# Patient Record
Sex: Female | Born: 1935 | Race: White | Hispanic: No | Marital: Married | Smoking: Never smoker
Health system: Southern US, Community
[De-identification: ages and names within clinical notes are randomized; demographics above are authoritative.]

## PROBLEM LIST (undated history)

## (undated) DIAGNOSIS — I2510 Atherosclerotic heart disease of native coronary artery without angina pectoris: Secondary | ICD-10-CM

## (undated) DIAGNOSIS — Z95810 Presence of automatic (implantable) cardiac defibrillator: Secondary | ICD-10-CM

## (undated) DIAGNOSIS — C801 Malignant (primary) neoplasm, unspecified: Secondary | ICD-10-CM

## (undated) DIAGNOSIS — E119 Type 2 diabetes mellitus without complications: Secondary | ICD-10-CM

## (undated) DIAGNOSIS — C50919 Malignant neoplasm of unspecified site of unspecified female breast: Secondary | ICD-10-CM

## (undated) DIAGNOSIS — I10 Essential (primary) hypertension: Secondary | ICD-10-CM

## (undated) DIAGNOSIS — IMO0001 Reserved for inherently not codable concepts without codable children: Secondary | ICD-10-CM

## (undated) DIAGNOSIS — I509 Heart failure, unspecified: Secondary | ICD-10-CM

## (undated) HISTORY — PX: JOINT REPLACEMENT: SHX530

## (undated) HISTORY — PX: CARDIAC DEFIBRILLATOR PLACEMENT: SHX171

## (undated) HISTORY — PX: MASTECTOMY: SHX3

---

## 2005-01-17 ENCOUNTER — Ambulatory Visit: Payer: Self-pay | Admitting: Unknown Physician Specialty

## 2005-01-17 ENCOUNTER — Other Ambulatory Visit: Payer: Self-pay

## 2005-01-24 ENCOUNTER — Ambulatory Visit: Payer: Self-pay | Admitting: Unknown Physician Specialty

## 2007-09-13 ENCOUNTER — Ambulatory Visit: Payer: Self-pay | Admitting: Internal Medicine

## 2007-09-17 ENCOUNTER — Ambulatory Visit: Payer: Self-pay | Admitting: Internal Medicine

## 2007-10-02 ENCOUNTER — Ambulatory Visit: Payer: Self-pay | Admitting: Internal Medicine

## 2007-10-07 ENCOUNTER — Ambulatory Visit: Payer: Self-pay | Admitting: Internal Medicine

## 2007-10-08 ENCOUNTER — Ambulatory Visit: Payer: Self-pay | Admitting: General Surgery

## 2007-10-11 ENCOUNTER — Ambulatory Visit: Payer: Self-pay | Admitting: Internal Medicine

## 2007-11-11 ENCOUNTER — Ambulatory Visit: Payer: Self-pay | Admitting: Internal Medicine

## 2007-12-11 ENCOUNTER — Ambulatory Visit: Payer: Self-pay | Admitting: Internal Medicine

## 2008-01-11 ENCOUNTER — Ambulatory Visit: Payer: Self-pay | Admitting: Internal Medicine

## 2008-02-10 ENCOUNTER — Ambulatory Visit: Payer: Self-pay | Admitting: Internal Medicine

## 2008-03-02 ENCOUNTER — Other Ambulatory Visit: Payer: Self-pay

## 2008-03-02 ENCOUNTER — Emergency Department: Payer: Self-pay | Admitting: Emergency Medicine

## 2008-03-12 ENCOUNTER — Ambulatory Visit: Payer: Self-pay | Admitting: Internal Medicine

## 2008-03-29 ENCOUNTER — Ambulatory Visit: Payer: Self-pay | Admitting: Surgery

## 2008-04-04 ENCOUNTER — Inpatient Hospital Stay: Payer: Self-pay | Admitting: Surgery

## 2008-04-12 ENCOUNTER — Ambulatory Visit: Payer: Self-pay | Admitting: Internal Medicine

## 2008-05-12 ENCOUNTER — Ambulatory Visit: Payer: Self-pay | Admitting: Internal Medicine

## 2008-06-12 ENCOUNTER — Ambulatory Visit: Payer: Self-pay | Admitting: Internal Medicine

## 2008-07-12 ENCOUNTER — Ambulatory Visit: Payer: Self-pay | Admitting: Internal Medicine

## 2008-08-12 ENCOUNTER — Ambulatory Visit: Payer: Self-pay | Admitting: Internal Medicine

## 2008-09-12 ENCOUNTER — Ambulatory Visit: Payer: Self-pay | Admitting: Internal Medicine

## 2008-09-20 ENCOUNTER — Ambulatory Visit: Payer: Self-pay | Admitting: Internal Medicine

## 2008-10-10 ENCOUNTER — Ambulatory Visit: Payer: Self-pay | Admitting: Internal Medicine

## 2008-11-10 ENCOUNTER — Ambulatory Visit: Payer: Self-pay | Admitting: Internal Medicine

## 2008-12-10 ENCOUNTER — Ambulatory Visit: Payer: Self-pay | Admitting: Internal Medicine

## 2009-01-10 ENCOUNTER — Ambulatory Visit: Payer: Self-pay | Admitting: Internal Medicine

## 2009-04-12 ENCOUNTER — Ambulatory Visit: Payer: Self-pay | Admitting: Internal Medicine

## 2009-05-08 ENCOUNTER — Ambulatory Visit: Payer: Self-pay | Admitting: Internal Medicine

## 2009-05-12 ENCOUNTER — Ambulatory Visit: Payer: Self-pay | Admitting: Internal Medicine

## 2009-06-12 ENCOUNTER — Ambulatory Visit: Payer: Self-pay | Admitting: Internal Medicine

## 2009-07-03 ENCOUNTER — Ambulatory Visit: Payer: Self-pay | Admitting: Radiation Oncology

## 2009-09-12 ENCOUNTER — Ambulatory Visit: Payer: Self-pay | Admitting: Internal Medicine

## 2009-10-05 ENCOUNTER — Ambulatory Visit: Payer: Self-pay | Admitting: Internal Medicine

## 2009-10-09 ENCOUNTER — Ambulatory Visit: Payer: Self-pay | Admitting: Internal Medicine

## 2009-10-10 ENCOUNTER — Ambulatory Visit: Payer: Self-pay | Admitting: Internal Medicine

## 2009-11-10 ENCOUNTER — Ambulatory Visit: Payer: Self-pay | Admitting: Internal Medicine

## 2010-01-08 ENCOUNTER — Ambulatory Visit: Payer: Self-pay | Admitting: Internal Medicine

## 2010-01-10 ENCOUNTER — Ambulatory Visit: Payer: Self-pay | Admitting: Internal Medicine

## 2010-03-12 ENCOUNTER — Ambulatory Visit: Payer: Self-pay | Admitting: Internal Medicine

## 2010-04-09 ENCOUNTER — Ambulatory Visit: Payer: Self-pay | Admitting: Internal Medicine

## 2010-04-10 LAB — CANCER ANTIGEN 27.29: CA 27.29: 29 U/mL (ref 0.0–38.6)

## 2010-04-12 ENCOUNTER — Ambulatory Visit: Payer: Self-pay | Admitting: Internal Medicine

## 2010-10-09 ENCOUNTER — Ambulatory Visit: Payer: Self-pay | Admitting: Internal Medicine

## 2010-10-10 ENCOUNTER — Ambulatory Visit: Payer: Self-pay | Admitting: Internal Medicine

## 2010-10-11 ENCOUNTER — Ambulatory Visit: Payer: Self-pay | Admitting: Internal Medicine

## 2010-10-11 LAB — CANCER ANTIGEN 27.29: CA 27.29: 43.9 U/mL — ABNORMAL HIGH (ref 0.0–38.6)

## 2010-11-11 ENCOUNTER — Ambulatory Visit: Payer: Self-pay | Admitting: Internal Medicine

## 2011-01-09 ENCOUNTER — Ambulatory Visit: Payer: Self-pay | Admitting: Internal Medicine

## 2011-01-11 ENCOUNTER — Ambulatory Visit: Payer: Self-pay | Admitting: Internal Medicine

## 2011-02-05 ENCOUNTER — Ambulatory Visit: Payer: Self-pay

## 2011-03-27 ENCOUNTER — Ambulatory Visit: Payer: Self-pay | Admitting: Internal Medicine

## 2011-03-28 LAB — CANCER ANTIGEN 27.29: CA 27.29: 36.1 U/mL (ref 0.0–38.6)

## 2011-04-13 ENCOUNTER — Ambulatory Visit: Payer: Self-pay | Admitting: Internal Medicine

## 2011-10-14 ENCOUNTER — Ambulatory Visit: Payer: Self-pay | Admitting: Internal Medicine

## 2011-11-18 ENCOUNTER — Ambulatory Visit: Payer: Self-pay | Admitting: Internal Medicine

## 2011-11-18 LAB — CBC CANCER CENTER
Basophil #: 0 x10 3/mm (ref 0.0–0.1)
Basophil %: 0.2 %
Eosinophil %: 1.1 %
HGB: 11.6 g/dL — ABNORMAL LOW (ref 12.0–16.0)
Lymphocyte #: 1.7 x10 3/mm (ref 1.0–3.6)
Lymphocyte %: 22.9 %
MCH: 32.6 pg (ref 26.0–34.0)
MCHC: 35.2 g/dL (ref 32.0–36.0)
Monocyte %: 7.3 %
Neutrophil %: 68.5 %
Platelet: 250 x10 3/mm (ref 150–440)
RBC: 3.56 10*6/uL — ABNORMAL LOW (ref 3.80–5.20)
RDW: 12.6 % (ref 11.5–14.5)
WBC: 7.5 x10 3/mm (ref 3.6–11.0)

## 2011-11-18 LAB — HEPATIC FUNCTION PANEL A (ARMC)
Albumin: 3.6 g/dL (ref 3.4–5.0)
Bilirubin, Direct: 0.1 mg/dL (ref 0.00–0.20)
Bilirubin,Total: 0.5 mg/dL (ref 0.2–1.0)
SGOT(AST): 23 U/L (ref 15–37)

## 2011-11-18 LAB — CREATININE, SERUM: EGFR (Non-African Amer.): 46 — ABNORMAL LOW

## 2011-12-11 ENCOUNTER — Ambulatory Visit: Payer: Self-pay | Admitting: Internal Medicine

## 2012-10-15 ENCOUNTER — Ambulatory Visit: Payer: Self-pay | Admitting: Internal Medicine

## 2012-11-02 ENCOUNTER — Ambulatory Visit: Payer: Self-pay | Admitting: General Practice

## 2012-11-02 LAB — BASIC METABOLIC PANEL
Anion Gap: 4 — ABNORMAL LOW (ref 7–16)
BUN: 29 mg/dL — ABNORMAL HIGH (ref 7–18)
Chloride: 104 mmol/L (ref 98–107)
Co2: 29 mmol/L (ref 21–32)
Creatinine: 0.95 mg/dL (ref 0.60–1.30)
EGFR (African American): >60
EGFR (Non-African Amer.): 58 — ABNORMAL LOW
Potassium: 3.9 mmol/L (ref 3.5–5.1)
Sodium: 137 mmol/L (ref 136–145)

## 2012-11-02 LAB — MRSA PCR SCREENING

## 2012-11-02 LAB — URINALYSIS, COMPLETE
Bilirubin,UR: NEGATIVE
Glucose,UR: NEGATIVE mg/dL (ref 0–75)
Ketone: NEGATIVE
Protein: 30
Squamous Epithelial: 3
WBC UR: 25 /HPF (ref 0–5)

## 2012-11-02 LAB — CBC
MCH: 31.5 pg (ref 26.0–34.0)
Platelet: 289 10*3/uL (ref 150–440)
RBC: 3.28 10*6/uL — ABNORMAL LOW (ref 3.80–5.20)
WBC: 7.3 10*3/uL (ref 3.6–11.0)

## 2012-11-02 LAB — PROTIME-INR: INR: 1

## 2012-11-16 ENCOUNTER — Inpatient Hospital Stay: Payer: Self-pay | Admitting: General Practice

## 2012-11-17 LAB — BASIC METABOLIC PANEL
Calcium, Total: 8 mg/dL — ABNORMAL LOW (ref 8.5–10.1)
Chloride: 106 mmol/L (ref 98–107)
Co2: 25 mmol/L (ref 21–32)
EGFR (African American): 59 — ABNORMAL LOW
EGFR (Non-African Amer.): 51 — ABNORMAL LOW
Glucose: 255 mg/dL — ABNORMAL HIGH (ref 65–99)
Potassium: 4.3 mmol/L (ref 3.5–5.1)
Sodium: 138 mmol/L (ref 136–145)

## 2012-11-17 LAB — HEMOGLOBIN: HGB: 7.9 g/dL — ABNORMAL LOW (ref 12.0–16.0)

## 2012-11-18 LAB — BASIC METABOLIC PANEL
Calcium, Total: 8.1 mg/dL — ABNORMAL LOW (ref 8.5–10.1)
Chloride: 105 mmol/L (ref 98–107)
Co2: 26 mmol/L (ref 21–32)
Osmolality: 277 (ref 275–301)
Sodium: 138 mmol/L (ref 136–145)

## 2012-11-18 LAB — HEMOGLOBIN: HGB: 7.8 g/dL — ABNORMAL LOW (ref 12.0–16.0)

## 2012-11-20 LAB — BASIC METABOLIC PANEL
Anion Gap: 6 — ABNORMAL LOW (ref 7–16)
BUN: 22 mg/dL — ABNORMAL HIGH (ref 7–18)
Calcium, Total: 8.6 mg/dL (ref 8.5–10.1)
Chloride: 101 mmol/L (ref 98–107)
Co2: 31 mmol/L (ref 21–32)
Creatinine: 1.04 mg/dL (ref 0.60–1.30)
EGFR (Non-African Amer.): 52 — ABNORMAL LOW
Potassium: 3.5 mmol/L (ref 3.5–5.1)
Sodium: 138 mmol/L (ref 136–145)

## 2012-11-20 LAB — CBC WITH DIFFERENTIAL/PLATELET
Basophil %: 0.2 %
Eosinophil #: 0.2 10*3/uL (ref 0.0–0.7)
Eosinophil %: 1.6 %
HGB: 7.3 g/dL — ABNORMAL LOW (ref 12.0–16.0)
MCH: 31.2 pg (ref 26.0–34.0)
MCHC: 33.7 g/dL (ref 32.0–36.0)
Monocyte #: 1.1 x10 3/mm — ABNORMAL HIGH (ref 0.2–0.9)
WBC: 12.1 10*3/uL — ABNORMAL HIGH (ref 3.6–11.0)

## 2012-11-22 LAB — HEMOGLOBIN: HGB: 8.4 g/dL — ABNORMAL LOW (ref 12.0–16.0)

## 2012-11-22 LAB — BASIC METABOLIC PANEL
Anion Gap: 5 — ABNORMAL LOW (ref 7–16)
BUN: 22 mg/dL — ABNORMAL HIGH (ref 7–18)
Calcium, Total: 8.7 mg/dL (ref 8.5–10.1)
Co2: 32 mmol/L (ref 21–32)
EGFR (African American): >60
Glucose: 191 mg/dL — ABNORMAL HIGH (ref 65–99)
Potassium: 3.7 mmol/L (ref 3.5–5.1)
Sodium: 136 mmol/L (ref 136–145)

## 2012-11-23 LAB — CBC WITH DIFFERENTIAL/PLATELET
Basophil #: 0 10*3/uL (ref 0.0–0.1)
Eosinophil #: 0.4 10*3/uL (ref 0.0–0.7)
Eosinophil %: 3.9 %
HGB: 9 g/dL — ABNORMAL LOW (ref 12.0–16.0)
Lymphocyte %: 17.5 %
MCH: 30.1 pg (ref 26.0–34.0)
MCHC: 34 g/dL (ref 32.0–36.0)
Monocyte #: 0.8 x10 3/mm (ref 0.2–0.9)
Monocyte %: 8.5 %
Neutrophil #: 6.8 10*3/uL — ABNORMAL HIGH (ref 1.4–6.5)
RDW: 18.2 % — ABNORMAL HIGH (ref 11.5–14.5)
WBC: 9.7 10*3/uL (ref 3.6–11.0)

## 2012-11-23 LAB — BASIC METABOLIC PANEL
Anion Gap: 3 — ABNORMAL LOW (ref 7–16)
BUN: 20 mg/dL — ABNORMAL HIGH (ref 7–18)
Calcium, Total: 8.8 mg/dL (ref 8.5–10.1)
Co2: 33 mmol/L — ABNORMAL HIGH (ref 21–32)
Creatinine: 0.97 mg/dL (ref 0.60–1.30)
EGFR (African American): >60
Glucose: 82 mg/dL (ref 65–99)
Potassium: 3.4 mmol/L — ABNORMAL LOW (ref 3.5–5.1)
Sodium: 135 mmol/L — ABNORMAL LOW (ref 136–145)

## 2013-03-15 ENCOUNTER — Ambulatory Visit: Payer: Self-pay | Admitting: Internal Medicine

## 2013-03-16 LAB — HEPATIC FUNCTION PANEL A (ARMC)
Alkaline Phosphatase: 122 U/L (ref 50–136)
SGOT(AST): 19 U/L (ref 15–37)

## 2013-03-16 LAB — CBC CANCER CENTER
Basophil %: 0.6 %
Eosinophil #: 0.1 x10 3/mm (ref 0.0–0.7)
Eosinophil %: 1.3 %
HCT: 32.8 % — ABNORMAL LOW (ref 35.0–47.0)
HGB: 11.2 g/dL — ABNORMAL LOW (ref 12.0–16.0)
Lymphocyte %: 24.4 %
MCHC: 34.2 g/dL (ref 32.0–36.0)
MCV: 90 fL (ref 80–100)
Monocyte #: 0.6 x10 3/mm (ref 0.2–0.9)
Neutrophil #: 5.1 x10 3/mm (ref 1.4–6.5)
Platelet: 269 x10 3/mm (ref 150–440)
RBC: 3.64 10*6/uL — ABNORMAL LOW (ref 3.80–5.20)
RDW: 15.3 % — ABNORMAL HIGH (ref 11.5–14.5)

## 2013-03-16 LAB — CREATININE, SERUM
Creatinine: 1.17 mg/dL (ref 0.60–1.30)
EGFR (African American): 52 — ABNORMAL LOW
EGFR (Non-African Amer.): 45 — ABNORMAL LOW

## 2013-04-12 ENCOUNTER — Ambulatory Visit: Payer: Self-pay | Admitting: Internal Medicine

## 2013-12-13 ENCOUNTER — Ambulatory Visit: Payer: Self-pay | Admitting: Family Medicine

## 2014-01-25 DIAGNOSIS — Z794 Long term (current) use of insulin: Secondary | ICD-10-CM | POA: Insufficient documentation

## 2014-01-25 DIAGNOSIS — R809 Proteinuria, unspecified: Secondary | ICD-10-CM | POA: Insufficient documentation

## 2014-01-25 DIAGNOSIS — E7800 Pure hypercholesterolemia, unspecified: Secondary | ICD-10-CM | POA: Insufficient documentation

## 2014-01-25 DIAGNOSIS — E1165 Type 2 diabetes mellitus with hyperglycemia: Secondary | ICD-10-CM | POA: Insufficient documentation

## 2014-01-25 DIAGNOSIS — IMO0002 Reserved for concepts with insufficient information to code with codable children: Secondary | ICD-10-CM | POA: Insufficient documentation

## 2014-03-16 ENCOUNTER — Ambulatory Visit: Payer: Self-pay | Admitting: Internal Medicine

## 2014-03-16 LAB — HEPATIC FUNCTION PANEL A (ARMC)
ALT: 35 U/L
Albumin: 3.1 g/dL — ABNORMAL LOW (ref 3.4–5.0)
Alkaline Phosphatase: 105 U/L
Bilirubin, Direct: 0.1 mg/dL (ref 0.00–0.20)
Bilirubin,Total: 0.9 mg/dL (ref 0.2–1.0)
SGOT(AST): 27 U/L (ref 15–37)
Total Protein: 7.6 g/dL (ref 6.4–8.2)

## 2014-03-16 LAB — CBC CANCER CENTER
Basophil #: 0 x10 3/mm (ref 0.0–0.1)
Basophil %: 0.6 %
Eosinophil #: 0.2 x10 3/mm (ref 0.0–0.7)
Eosinophil %: 2.2 %
HCT: 35 % (ref 35.0–47.0)
HGB: 11.7 g/dL — AB (ref 12.0–16.0)
LYMPHS ABS: 1.7 x10 3/mm (ref 1.0–3.6)
Lymphocyte %: 22 %
MCH: 31.4 pg (ref 26.0–34.0)
MCHC: 33.4 g/dL (ref 32.0–36.0)
MCV: 94 fL (ref 80–100)
MONO ABS: 0.6 x10 3/mm (ref 0.2–0.9)
Monocyte %: 7.6 %
NEUTROS PCT: 67.6 %
Neutrophil #: 5.1 x10 3/mm (ref 1.4–6.5)
Platelet: 318 x10 3/mm (ref 150–440)
RBC: 3.73 10*6/uL — ABNORMAL LOW (ref 3.80–5.20)
RDW: 13.6 % (ref 11.5–14.5)
WBC: 7.6 x10 3/mm (ref 3.6–11.0)

## 2014-03-16 LAB — CREATININE, SERUM
CREATININE: 1.05 mg/dL (ref 0.60–1.30)
EGFR (African American): 58 — ABNORMAL LOW
GFR CALC NON AF AMER: 50 — AB

## 2014-03-17 LAB — CANCER ANTIGEN 27.29: CA 27.29: 31.5 U/mL (ref 0.0–38.6)

## 2014-04-12 ENCOUNTER — Ambulatory Visit: Payer: Self-pay | Admitting: Internal Medicine

## 2014-07-06 ENCOUNTER — Inpatient Hospital Stay: Payer: Self-pay | Admitting: Internal Medicine

## 2014-07-06 LAB — CBC WITH DIFFERENTIAL/PLATELET
BASOS PCT: 0.6 %
Basophil #: 0.1 10*3/uL (ref 0.0–0.1)
EOS ABS: 0 10*3/uL (ref 0.0–0.7)
Eosinophil %: 0 %
HCT: 42 % (ref 35.0–47.0)
HGB: 13.9 g/dL (ref 12.0–16.0)
Lymphocyte #: 1.4 10*3/uL (ref 1.0–3.6)
Lymphocyte %: 11.1 %
MCH: 31.3 pg (ref 26.0–34.0)
MCHC: 33.2 g/dL (ref 32.0–36.0)
MCV: 95 fL (ref 80–100)
MONO ABS: 0.3 x10 3/mm (ref 0.2–0.9)
MONOS PCT: 2.1 %
NEUTROS ABS: 10.9 10*3/uL — AB (ref 1.4–6.5)
NEUTROS PCT: 86.2 %
Platelet: 299 10*3/uL (ref 150–440)
RBC: 4.45 10*6/uL (ref 3.80–5.20)
RDW: 13.7 % (ref 11.5–14.5)
WBC: 12.7 10*3/uL — ABNORMAL HIGH (ref 3.6–11.0)

## 2014-07-06 LAB — APTT: Activated PTT: 31.5 secs (ref 23.6–35.9)

## 2014-07-06 LAB — PROTIME-INR
INR: 1.1
Prothrombin Time: 14 secs (ref 11.5–14.7)

## 2014-07-06 LAB — BASIC METABOLIC PANEL
Anion Gap: 13 (ref 7–16)
BUN: 16 mg/dL (ref 7–18)
CALCIUM: 9.3 mg/dL (ref 8.5–10.1)
CO2: 25 mmol/L (ref 21–32)
Chloride: 95 mmol/L — ABNORMAL LOW (ref 98–107)
Creatinine: 1.33 mg/dL — ABNORMAL HIGH (ref 0.60–1.30)
EGFR (African American): 50 — ABNORMAL LOW
GFR CALC NON AF AMER: 41 — AB
Glucose: 453 mg/dL — ABNORMAL HIGH (ref 65–99)
OSMOLALITY: 287 (ref 275–301)
Potassium: 3.8 mmol/L (ref 3.5–5.1)
SODIUM: 133 mmol/L — AB (ref 136–145)

## 2014-07-06 LAB — CK TOTAL AND CKMB (NOT AT ARMC)
CK, Total: 2037 U/L — ABNORMAL HIGH (ref 26–192)
CK-MB: 122 ng/mL — ABNORMAL HIGH (ref 0.5–3.6)

## 2014-07-06 LAB — TROPONIN I
Troponin-I: >40 ng/mL
Troponin-I: >40 ng/mL

## 2014-07-08 DIAGNOSIS — I2109 ST elevation (STEMI) myocardial infarction involving other coronary artery of anterior wall: Secondary | ICD-10-CM | POA: Insufficient documentation

## 2014-07-12 DIAGNOSIS — I2510 Atherosclerotic heart disease of native coronary artery without angina pectoris: Secondary | ICD-10-CM | POA: Insufficient documentation

## 2014-07-19 DIAGNOSIS — I4892 Unspecified atrial flutter: Secondary | ICD-10-CM | POA: Insufficient documentation

## 2014-08-17 DIAGNOSIS — L308 Other specified dermatitis: Secondary | ICD-10-CM | POA: Insufficient documentation

## 2014-08-24 ENCOUNTER — Observation Stay: Payer: Self-pay | Admitting: Family Medicine

## 2014-08-24 LAB — CBC
HCT: 38.7 % (ref 35.0–47.0)
HGB: 12.7 g/dL (ref 12.0–16.0)
MCH: 30.9 pg (ref 26.0–34.0)
MCHC: 32.9 g/dL (ref 32.0–36.0)
MCV: 94 fL (ref 80–100)
PLATELETS: 361 10*3/uL (ref 150–440)
RBC: 4.12 10*6/uL (ref 3.80–5.20)
RDW: 15 % — ABNORMAL HIGH (ref 11.5–14.5)
WBC: 7.6 10*3/uL (ref 3.6–11.0)

## 2014-08-24 LAB — COMPREHENSIVE METABOLIC PANEL
ALBUMIN: 2.9 g/dL — AB (ref 3.4–5.0)
ALK PHOS: 226 U/L — AB
AST: 45 U/L — AB (ref 15–37)
Anion Gap: 11 (ref 7–16)
BUN: 22 mg/dL — ABNORMAL HIGH (ref 7–18)
Bilirubin,Total: 0.4 mg/dL (ref 0.2–1.0)
CALCIUM: 9.3 mg/dL (ref 8.5–10.1)
CHLORIDE: 97 mmol/L — AB (ref 98–107)
CO2: 24 mmol/L (ref 21–32)
Creatinine: 1.51 mg/dL — ABNORMAL HIGH (ref 0.60–1.30)
EGFR (African American): 43 — ABNORMAL LOW
EGFR (Non-African Amer.): 35 — ABNORMAL LOW
Glucose: 405 mg/dL — ABNORMAL HIGH (ref 65–99)
Osmolality: 285 (ref 275–301)
POTASSIUM: 4.5 mmol/L (ref 3.5–5.1)
SGPT (ALT): 38 U/L
Sodium: 132 mmol/L — ABNORMAL LOW (ref 136–145)
TOTAL PROTEIN: 8.1 g/dL (ref 6.4–8.2)

## 2014-08-24 LAB — TROPONIN I
Troponin-I: 0.06 ng/mL — ABNORMAL HIGH
Troponin-I: 0.05 ng/mL
Troponin-I: 0.06 ng/mL — ABNORMAL HIGH

## 2014-08-24 LAB — CK TOTAL AND CKMB (NOT AT ARMC)
CK, TOTAL: 90 U/L (ref 26–192)
CK, Total: 114 U/L (ref 26–192)
CK, Total: 113 U/L (ref 26–192)
CK-MB: 2.3 ng/mL (ref 0.5–3.6)
CK-MB: 2.2 ng/mL (ref 0.5–3.6)
CK-MB: 1.8 ng/mL (ref 0.5–3.6)

## 2014-08-25 LAB — BASIC METABOLIC PANEL
Anion Gap: 7 (ref 7–16)
BUN: 23 mg/dL — AB (ref 7–18)
CO2: 28 mmol/L (ref 21–32)
Calcium, Total: 9 mg/dL (ref 8.5–10.1)
Chloride: 102 mmol/L (ref 98–107)
Creatinine: 1.4 mg/dL — ABNORMAL HIGH (ref 0.60–1.30)
EGFR (African American): 47 — ABNORMAL LOW
EGFR (Non-African Amer.): 39 — ABNORMAL LOW
Glucose: 64 mg/dL — ABNORMAL LOW (ref 65–99)
Osmolality: 276 (ref 275–301)
Potassium: 4.3 mmol/L (ref 3.5–5.1)
Sodium: 137 mmol/L (ref 136–145)

## 2014-08-25 LAB — CBC WITH DIFFERENTIAL/PLATELET
Basophil #: 0.1 10*3/uL (ref 0.0–0.1)
Basophil %: 1.2 %
EOS PCT: 2.6 %
Eosinophil #: 0.2 10*3/uL (ref 0.0–0.7)
HCT: 33.2 % — AB (ref 35.0–47.0)
HGB: 10.7 g/dL — ABNORMAL LOW (ref 12.0–16.0)
LYMPHS ABS: 2.6 10*3/uL (ref 1.0–3.6)
Lymphocyte %: 33.6 %
MCH: 30.3 pg (ref 26.0–34.0)
MCHC: 32.3 g/dL (ref 32.0–36.0)
MCV: 94 fL (ref 80–100)
MONO ABS: 0.7 x10 3/mm (ref 0.2–0.9)
Monocyte %: 8.6 %
Neutrophil #: 4.2 10*3/uL (ref 1.4–6.5)
Neutrophil %: 54 %
PLATELETS: 324 10*3/uL (ref 150–440)
RBC: 3.54 10*6/uL — AB (ref 3.80–5.20)
RDW: 14.9 % — ABNORMAL HIGH (ref 11.5–14.5)
WBC: 7.7 10*3/uL (ref 3.6–11.0)

## 2014-08-26 LAB — BASIC METABOLIC PANEL
Anion Gap: 8 (ref 7–16)
BUN: 26 mg/dL — AB (ref 7–18)
Calcium, Total: 8.7 mg/dL (ref 8.5–10.1)
Chloride: 101 mmol/L (ref 98–107)
Co2: 25 mmol/L (ref 21–32)
Creatinine: 1.4 mg/dL — ABNORMAL HIGH (ref 0.60–1.30)
EGFR (African American): 47 — ABNORMAL LOW
EGFR (Non-African Amer.): 39 — ABNORMAL LOW
Glucose: 196 mg/dL — ABNORMAL HIGH (ref 65–99)
Osmolality: 278 (ref 275–301)
POTASSIUM: 4.3 mmol/L (ref 3.5–5.1)
Sodium: 134 mmol/L — ABNORMAL LOW (ref 136–145)

## 2014-08-27 LAB — BASIC METABOLIC PANEL
ANION GAP: 8 (ref 7–16)
BUN: 23 mg/dL — ABNORMAL HIGH (ref 7–18)
CO2: 25 mmol/L (ref 21–32)
CREATININE: 1.21 mg/dL (ref 0.60–1.30)
Calcium, Total: 9 mg/dL (ref 8.5–10.1)
Chloride: 105 mmol/L (ref 98–107)
EGFR (Non-African Amer.): 46 — ABNORMAL LOW
GFR CALC AF AMER: 55 — AB
Glucose: 63 mg/dL — ABNORMAL LOW (ref 65–99)
OSMOLALITY: 277 (ref 275–301)
Potassium: 4.1 mmol/L (ref 3.5–5.1)
Sodium: 138 mmol/L (ref 136–145)

## 2014-10-14 ENCOUNTER — Inpatient Hospital Stay: Payer: Self-pay | Admitting: Internal Medicine

## 2014-12-02 NOTE — Discharge Summary (Signed)
PATIENT NAME:  Lauren Best, Lauren Best MR#:  [id number] DATE OF BIRTH:  01/17/1933  DATE OF ADMISSION:  11/16/2012 DATE OF DISCHARGE: 11/19/2012   ADMITTING DIAGNOSIS: Degenerative arthrosis of the right knee.   DISCHARGE DIAGNOSIS: Degenerative arthrosis of the right knee.   HISTORY: The patient is a pleasant 81 year old female, who has been followed at [facility] for progression of right knee pain. She reported an approximately 8 to 9 year history of progressive knee pain. She had had a remote right knee arthroscopy performed by Dr. Mauri Pole  in 2006. She had seen progression in her discomfort to the medial joint line. The patient also reported crepitus with start of stiffness. She has also been experincing some night pain. The most significant pain was along the medial aspect of the knee, but she also had some lateral joint line discomfort. At the time of surgery, she was not using any ambulatory aid. The patient had not seen any significant improvement despite activity modification, viscous supplementation, and nonsteroidal anti-inflammatory medications. She had reported some activity-related swelling of the knee as well as near giving way. She was having difficulty with ascending and descending stairs due to the severity of knee pain. The pain had progressed to the point that it was significantly interfering with her activities of daily living. X-rays taken in [place] showed narrowing of the medial cartilage space with associated varus alignment. Subchondral sclerosis was noted. She was also noted to have significant patellofemoral degenerative changes. After discussion of the risks and benefits of surgical intervention, the patient expressed her understanding of the risks and benefits and agreed for plans for surgical intervention.   PROCEDURE: Right total knee arthroplasty using computer-assisted navigation.   ANESTHESIA: Femoral nerve block with spinal.   SOFT TISSUE RELEASE:  Anterior cruciate ligament, posterior cruciate ligament, deep medial collateral ligaments, as well as patellofemoral ligament.   IMPLANTS UTILIZED: DePuy PFC Sigma size 3 posterior stabilized femoral component (cemented), size 3 MBT tibial component (cemented), 35 mm 3-pegged oval dome patella (cemented), and a 10 mm stabilized rotating platform polyethylene insert.   HOSPITAL COURSE: The patient tolerated the procedure very well. She had no complications. She was then taken to the PACU where she was stabilized and then transferred to the orthopedic floor. The patient began receiving anticoagulation therapy of Lovenox 30 mg subcutaneous every 12 hours per anesthesia and pharmacy protocol. She was fitted with TED stockings bilaterally. These were allowed to be removed 1 hour per 8 hour shift. The right one was applied on day 2 following removal of the Hemovac and the dressing change. The wound was free of any drainage or signs of infection. The patient was also fitted with the AV-I compression foot pumps bilaterally set at 80 mmHg. Her calves have been nontender. There has been no evidence of any deep venous thromboses. Negative Homans sign. Heels were elevated off the bed using rolled towels.   The patient has denied any chest pains or shortness of breath. Vital signs have been stable. She has been afebrile. Hemodynamically she has been stable. No transfusions were needed other than the Autovac transfusions postoperatively. Her hemoglobin did drop to 7.8, but she was asymptomatic. Vital signs were stable. She was not dizzy or lightheaded. No weakness was reported.   Physical therapy was initiated on day 1 for gait training and transfers. She has done very well. She was able to ambulate greater than 200 feet. She was able to go up and down 4 sets of steps. She  was independent with bed to chair transfers. Occupational therapy was also initiated on day 1 for ADLs and assistive devices. There has been no  complications. The patient; however, likes to sleep in a fetal position. Every morning during rounds the patient's knee was noted to be flexed at a good 30 degrees. With some manipulation, we were able to get this straightened out. Then subsequently, she will be going home in knee immobilizer to be worn at night.   The patient's IV, Foley and Hemovac were discontinued on day 2. The Polar Care was reapplied to the surgical leg maintaining a temperature of 40 to 50 degrees Fahrenheit.   DISPOSITION:  1.  The patient is being discharged to home and appears in stable condition.  2.  Continue weight-bearing as tolerated.  3.  Continue TED stockings. These are to be worn during the day, but may be removed at night.  4.  Continue using a walker until cleared by physical therapy to go to a quad cane.  5.  She will receive home health PT.  6.  Continue Polar Care maintaining a temperature of 40 to 50 degrees Fahrenheit.  7.  Call the office if temperatures are 101.5 or greater or excessive bleeding.  8.  She has a followup appointment on 04/22.  9.  She was placed on a regular diet.  10. Resume regular medication that she was on prior to admission.  11. A prescription for Roxicodone 5 to 10 mg every 4 to 6 hours p.r.n. for pain was given. Also, a prescription for tramadol 50 to 100 mg every 4 to 6 hours p.r.n. for pain and Lovenox 40 mg subcutaneously daily for 14 days, then discontinue and begin taking one 81 mg enteric-coated aspirin.   PAST MEDICAL HISTORY:  1.  Seasonal allergies.  2.  Hypertension.  3.  Hyperlipidemia.  4.  Diabetes. 5.  Breast cancer in 2008.   ____________________________ Vance Peper, PA jrw:aw D: 11/19/2012 07:22:56 ET T: 11/19/2012 07:33:22 ET JOB#: [id number]  cc: Vance Peper, PA, <Dictator> Gaye Scorza PA ELECTRONICALLY SIGNED 11/19/2012 21:24

## 2014-12-02 NOTE — Op Note (Signed)
DATE OF BIRTH:  1933-08-10  DATE OF PROCEDURE:  11/16/2012  PREOPERATIVE DIAGNOSIS:  Degenerative arthrosis of the right knee.   POSTOPERATIVE DIAGNOSIS:  Degenerative arthrosis of the right knee.   PROCEDURE PERFORMED:  Right total knee arthroplasty using computer-assisted navigation.   SURGEON:  Dr. Skip Estimable   ASSISTANT:  Vance Peper, PA  ANESTHESIA:  Femoral nerve block and spinal.   ESTIMATED BLOOD LOSS:  50 mL.   FLUIDS REPLACED: 1600 mL of crystalloid.   TOURNIQUET TIME:  101 minutes.   DRAINS: Two medium drains to reinfusion system.   SOFT TISSUE RELEASES:  Anterior cruciate ligament, posterior cruciate ligament, deep medial collateral ligament, and patellofemoral ligament.   IMPLANTS UTILIZED:  DePuy PFC Sigma size 3 posterior stabilized femoral component (cemented), size 3 MBT tibial component (cemented), 35 mm 3-peg oval dome patella (cemented), and a 10 mm stabilized rotating platform polyethylene insert.   INDICATIONS FOR SURGERY: The patient is a 81 year old female who has been seen for complaints of progressive right knee pain. She denies any significant improvement despite conservative nonsurgical intervention. X-rays demonstrated severe degenerative changes in tricompartmental fashion with relative varus deformity. She did have a flexion contracture. After discussion of risks and benefits of surgical intervention, the patient expressed understanding of the risks and benefits, and agreed with plans for right total knee arthroplasty.   PROCEDURE IN DETAIL:  The patient was brought to the Operating Room and, after adequate femoral nerve block and spinal anesthesia was achieved, a tourniquet was placed on the patient's upper right thigh. The patient's right knee and leg were cleaned and prepped with alcohol and DuraPrep, draped in the usual sterile fashion. A "timeout" was performed, as per usual protocol. The patient's right lower extremity was exsanguinated using an  Esmarch, the tourniquet was inflated to 300 mmHg. An anterior longitudinal incision was made, followed by a standard mid vastus approach. A moderate effusion was evacuated. The deep fibers of the medial collateral ligament were elevated in subperiosteal fashion off of the medial flare of the tibia, so as to maintain a continuous soft tissue sleeve. The patella was subluxed laterally, and the patellofemoral ligament was incised. Inspection of the knee demonstrated severe degenerative changes in tricompartmental fashion, with full-thickness loss of articular cartilage, especially to the medial compartment. Prominent osteophytes were debrided using a rongeur. Anterior and posterior cruciate ligaments were excised. Two 4.0 mm Schanz pins were inserted into the femur and into the tibia for attachment of the array of trackers used for computer-assisted navigation. Hip center was identified using a circumduction technique. Distal landmarks were mapped using the computer. The distal femur and proximal tibia were mapped using the computer. Distal femoral cutting guide was positioned using computer-assisted navigation so as to achieve a 5-degree distal varus cut. Cut was performed and verified using the computer. Distal femur was sized, and it was felt that a size 3 femoral component was appropriate. Size 3 cutting guide was positioned, and anterior cut was performed and verified using the computer. This was followed by completion of the posterior and chamfer cuts. Femoral cutting guide for central valgus was then positioned, and the central valgus cut was performed.   Attention was then directed to the proximal tibia. Medial and lateral menisci were excised. The extramedullary tibial cutting guide was positioned using computer-assisted navigation so as to achieve 0 degree varus/valgus alignment and 0 degree posterior slope. Cut was performed and verified using the computer. The proximal tibia was sized, and it was felt  that  a size 3 tibial tray was appropriate. Tibial and femoral trials were inserted, followed by insertion of a 10 mm polyethylene insert. The knee was felt to be tight both in flexion and in extension. Trial components were removed, and the extramedullary tibial cutting guide was positioned so as to remove an additional 2 mm of bone. Cut was performed and verified using the computer. Trial reduction was again performed, and the knee was still felt to be tight both in flexion and in extension. The trial components were removed a second time, and the extramedullary tibial cutting guide was positioned so as to remove an additional 2 mm of bone. Cut was performed and verified using the computer. The size 3 tibial tray and the size 3 femoral component were positioned, and a 10 mm polyethylene insert was placed. Full extension was noted and excellent medial and lateral soft tissue balancing was appreciated both in flexion and in extension. Finally, the patella was cut and prepared so as to accommodate a 35 mm 3-peg oval dome patella. Patellar trial was placed, and the knee was placed through a range of motion. The femoral component was removed after debridement of posterior osteophytes. Central post hole for the tibial component was reamed, followed by insertion of a keel punch. The cut surfaces of bone were irrigated with copious amounts of normal saline with antibiotic solution using pulsatile lavage, and then suctioned dry. Polymethylmethacrylate cement with gentamicin was prepared in the usual fashion using a vacuum mixer. Cement was applied to the cut surface of the proximal tibia as well as along the undersurface of the size 3 MBT tibial component. Tibial component was positioned and impacted into place. Excess cement was removed using freer elevators. Cement was then applied to the cut surface of the femur as well as along the posterior flanges of a size 3 posterior stabilized femoral component. Femoral component  was positioned and impacted into place. Excess cement was removed using freer elevators. A 10 mm polyethylene trial was inserted, and the knee was brought in full extension with steady axial compression applied. Finally, cement was applied to the back side of a 35 mm 3-peg oval dome patella, and the patellar component was positioned and patellar clamp applied. Excess cement was removed using freer elevators.   After adequate curing of cement, the tourniquet was deflated after total tourniquet time of 101 minutes. Hemostasis was achieved using electrocautery. The knee was irrigated with copious amounts of normal saline with antibiotic solution using pulsatile lavage, and then suctioned dry. The knee was inspected for any residual cement debris. Then 30 mL of 0.25% Marcaine with epinephrine was injected along the posterior capsule. A 10 mm stabilized rotating platform polyethylene insert was inserted, and the knee was placed through a range of motion. Excellent patellar tracking was appreciated, and excellent mediolateral soft tissue balancing was noted.   Two medium drains were placed in the wound bed and brought out through a separate stab incision to be attached to a reinfusion system. The medial parapatellar portion of the incision was reapproximated using interrupted sutures of #1 Vicryl. The subcutaneous tissue was approximated in layers using first #0 Vicryl followed by #2-0 Vicryl. Skin was closed with skin staples. Sterile dressing was applied.   The patient tolerated the procedure well. She was transported to the recovery room in stable condition.    ____________________________ Laurice Record. Holley Bouche., MD jph:mr D: 11/16/2012 17:41:15 ET T: 11/16/2012 20:47:42 ET JOB#: [id number]  cc: Laurice Record. Holley Bouche., MD, <Dictator>  JAMES Evalina Field MD ELECTRONICALLY SIGNED 11/17/2012 19:53

## 2014-12-02 NOTE — Discharge Summary (Signed)
PATIENT NAME:  Lauren Best, Lauren Best MR#:  [id number] DATE OF BIRTH:  [birth year]  DATE OF ADMISSION:  11/16/2012 DATE OF DISCHARGE:  11/23/2012  ADDENDUM TO TRANSFER SUMMARY:  Although initial plans had been for discharge to home, the patient had a setback with physical therapy and had limited ambulation on the anticipated date of discharge. She was also noted to have some tachycardia as well as hypoxia, most notably with attempts at physical therapy. Subsequent blood work demonstrated hemoglobin to be 7.3. Due to the tachycardia as well as episodes of hypoxia, it was elected to proceed with transfusion of 1 unit of packed red blood cells. Subsequent hemoglobin on the day following transfusion was 8.4 with hemoglobin on the day of transfer being 9.0. The tachycardia essentially resolved and hypoxia improved significantly with pulmonary toilet and encouragement of incentive spirometry. She did have an episode on April 12th of hypoglycemia, which subsequently normalized. She was maintained on sliding scale insulin. On  April 13th, the patient made significant improvement with physical therapy, demonstrated improved stamina and maintenance of O2 saturation despite exercise. However, due to the hypoxia and tachycardia, internal medicine had recommended evaluation for pulmonary embolism not only due to her risk factors associated with surgery but also with the current use of Femara. CT scan with contrast could not be obtained and there was a delay in obtaining the lung V/Q scan until appropriate contrast could be obtained. V/Q scan was obtained on the date of admission with findings consistent with low probability of pulmonary embolism. Of note, repeat chest x-ray was also performed which demonstrated some slight increased density in the right upper lobe suggestive of either atelectasis versus pneumonia. Internal medicine subsequent placed the patient on Levaquin for a 7-day course. It should be noted that the patient has  been afebrile and has not had productive cough.   Following pulmonary workup, the patient was felt to be stable for transfer to a skilled nursing facility. The patient is discharged to [facility] in stable condition. Medications and instructions are as previously dictated.   ____________________________ Laurice Record. Holley Bouche., MD jph:cs D: 11/23/2012 14:50:59 ET T: 11/23/2012 15:06:40 ET JOB#: [id number]  cc: Jeneen Rinks P. Holley Bouche., MD, <Dictator> Laurice Record Holley Bouche MD ELECTRONICALLY SIGNED 11/23/2012 16:53

## 2014-12-02 NOTE — Consult Note (Signed)
PATIENT NAME:  Lauren Best, Lauren Best MR#:  [id number] DATE OF BIRTH:  Oct 11, 1932  DATE OF CONSULTATION:  11/19/2012  REFERRING PHYSICIAN:   Dr. Marry Guan.  CONSULTING PHYSICIAN:  Albertine Patricia, MD  PRIMARY CARE PHYSICIAN:  Dr. Netty Starring.     CHIEF COMPLAINT:  Hypoxia.   HISTORY OF PRESENT ILLNESS:  This is a 81 year old female with significant past medical history of diabetes, hypertension, hyperlipidemia, right breast cancer, osteoarthritis, normocytic anemia with baseline hemoglobin 1.7, with known history of right knee osteoarthrosis, who was admitted by Dr. Marry Guan where she had a right total knee arthroplasty using computer-assisted navigation, done on April 7th, today is the patient's postop day #3, physical consult was called for hypoxia, as the patient was noticed to be requiring oxygen during her hospital stay, 1 to 2 liters, and even becomes hypoxic on 1 liter of nasal cannula where she desaturated to 87. The patient denies any history of lung disease or cardiac disease, did not require oxygen before. She denies any chest pain, any shortness of breath, any coffee-ground emesis, any cough, any productive sputum, any fever or chills. The patient was noticed to have lower extremity edema. As mentioned earlier, there is no history of congestive heart failure. There is no echo done in the recent past as well. There is no BMP done as well. As well, the patient reports she has been using incentive spirometry 1 to 2 times a day only. The patient has been on DVT prophylaxis, subcutaneous Lovenox, 30 mg subcutaneous every 12 hours from postop day #1. The patient used to be on 100 mL fluid/hour for the last 2 days, which was stopped as of yesterday, as she lost her IV access.   PAST MEDICAL HISTORY:   1.  Diabetes mellitus.  2.  Hypertension.  3.  Hyperlipidemia.  4.  Right breast cancer status post right breast mastectomy, followed by Drs. Ma Hillock and [employer]. 5.  Osteoarthritis of the right knee.  6.   Normocytic anemia.   PAST SURGICAL HISTORY:  1.  Right knee surgery.  2.  Right breast mastectomy.  3.  Port-A-Cath placed by Dr. Jamal Collin.    FAMILY HISTORY:  No history of heart disease or cancer.   SOCIAL HISTORY:  The patient is married, lives with her husband and granddaughter, is retired. No history of tobacco, alcohol or substance abuse.   CURRENT MEDICATIONS: 1.  Tylenol 500 to 1000 mg every 4 hours as needed for pain and fever.  2.  Aluminum with magnesium hydroxide with simethicone as needed.  3.  Dulcolax suppository as needed.  4.  Calcium citrate with vitamin D twice a day.  5.  Vitamin B12 at 1000 mcg oral daily.  6.  Senna or docusate 1 tablet b.i.d.  7.  Hydrochlorothiazide 12.5 mg oral daily.  8.  Insulin 75/25 at 50 units b.i.d. a.c.  9.  Insulin sliding scale Novolin.  10.  Femara oral daily 2.5 mg. 11.  Milk of magnesia as needed.  12.  Metformin 1000 mg oral 2 times a day.  13.  Morphine as needed.  14.  Oxycodone as needed.  15.  Crestor 10 mg at bedtime.  16.  Protonix 40 mg oral 2 times a day.  17.  Tramadol 50 to 100 mg every 4 hours as needed for pain.  18.  Valsartan 150 mg oral daily.  19.  Lovenox 30 mg subcutaneous every 12 hours.  20.  Ferrous sulfate 325 mg oral p.o. b.i.d.  21.  Lactulose 30 mL p.o. b.i.d.   REVIEW OF SYSTEMS: CONSTITUTIONAL:  Denies any fever or chills, generalized weakness, weight loss or weight gain.  EYES:  Denies blurred vision, glaucoma, cataracts.  EAR, NOSE, THROAT:  Denies tinnitus, epistaxis, discharge.  RESPIRATORY:  Denies cough, COPD, hemoptysis, dyspnea, coffee-ground emesis, productive sputum.  CARDIOVASCULAR:  Denies chest pain, orthopnea, palpitations, syncope.  GASTROINTESTINAL:  Denies nausea, vomiting, diarrhea, abdominal pain. Has complaints of constipation.  GENITOURINARY:  Denies dysuria or hematuria.  ENDOCRINE:  Denies polyuria,  polydipsia, heat or cold intolerance.  HEMATOLOGY:  Denies easy bruising,  bleeding, diathesis, history of blood clots. Has history of chronic anemia.  INTEGUMENTARY:  Denies acne, rash or skin lesions.    MUSCULOSKELETAL:  Denies back pain, neck pain. Has right knee pain postop.  NEUROLOGIC:  No vertigo, ataxia, dementia.  PSYCHIATRIC:  Denies any depression, insomnia, substance or alcohol abuse.   PHYSICAL EXAMINATION: VITAL SIGNS:  Temperature 99.9, pulse 103, respiratory rate 18, blood pressure 122/69, saturating 93% on 2 liters nasal cannula, with ambulation dropped to 87%.  GENERAL:  Elderly female, obese, less comfortable, in no apparent distress.  HEENT:  Head atraumatic, normocephalic. Pupils equal, reactive to light. Pink conjunctivae. Anicteric sclerae. Moist oral mucosa.  NECK:  Supple. No thyromegaly. No JVD.  CHEST:  Has good air entry bilaterally. No wheezing, but has rales at the bases.  CARDIOVASCULAR:  S1, S2 heard. No rubs, murmurs or gallops.  ABDOMEN:  Obese, soft, nontender, nondistended. Bowel sounds present.  EXTREMITIES:  Bilaterally edema +1 to 2. Pedal pulses felt. Has right knee swelling postop, midline surgical scar looks nice. Healing nicely. No discharge or bleed.  NEUROLOGIC:  Cranial nerves grossly intact. Motor 5/5.  PSYCHIATRIC:  Appropriate affect. Awake, alert x 3.   PERTINENT LABORATORY DATA:  As of April 14, glucose 90, BUN 17, creatinine 1.05, sodium 138, potassium 3.6, chloride 105, anion gap 7. Hemoglobin 7.8, platelets 238.   ASSESSMENT AND PLAN: 1.  Hypoxia, mainly upon ambulation. Etiology so far is unclear, but the patient has lower extremity edema, even though there is no history of congestive heart failure in the past. We will check BNP, as well we will check 2-D echo to evaluate for congestive heart failure, as well we will obtain chest x-ray to evaluate for any volume overload, even though clinically aside from her lower extremity edema, there is not any evidence of congestive heart failure, but if has some volume  overload or congestive heart failure, we will start her on Lasix. As well, she has a history of obesity so unclear if pulmonary hypertension contributes to her hypoxia as well. We will obtain chest x-ray, as mentioned earlier. The patient was noncompliant with incentive spirometry, as she has only used it twice yesterday. Was encouraged to use it more often, as atelectasis might be contributing to her hypoxia.  2.  Anemia. The patient has known historyof normocytic anemia, which worsened from blood loss by surgery. We will continue her on iron supplement.  3.  Diabetes mellitus. Continue with metformin and Humulin 75/25, add insulin sliding scale.  4.  Hypertension, acceptable. Continue with hydrochlorothiazide.  5.  Hyperlipidemia. Continue with statin.  6.  History of right breast cancer. The patient is followed by Dr. Ma Hillock. Continue with Femara.  7.  Deep vein thrombosis prophylaxis, subcutaneous Lovenox.  8.  Gastrointestinal prophylaxis, on proton pump inhibitor.  TOTAL TIME SPENT: 45 minutes.   ____________________________ Albertine Patricia, MD dse:dmm D: 11/19/2012 09:00:18 ET T: 11/19/2012  09:57:37 ET JOB#: [id number]  cc: Albertine Patricia, MD, <Dictator> Oluwaseun Cremer Graciela Husbands MD ELECTRONICALLY SIGNED 11/20/2012 7:18

## 2014-12-03 NOTE — Consult Note (Signed)
PATIENT NAME:  Lauren Best, Lauren Best MR#:  [id number] DATE OF BIRTH:  March 09, 1933  DATE OF CONSULTATION:  07/06/2014   PRIMARY CARE PHYSICIAN: Dion Body, MD.  CONSULTING PHYSICIAN:  Dwayne D. Callwood, MD.  REASON FOR CONSULTATION: Medical management.   HISTORY OF PRESENT ILLNESS: This is a 81 year old female who presented to the hospital complaining of ongoing chest pain and pressure for the past 2 weeks associated with some exertional shortness of breath. The patient was in the ER and was noted to have ST elevations and admitted for an ST-elevation MI. She urgently underwent a cardiac catheterization which showed a completely occluded LAD.  Post catheterization, the patient has been brought to the intensive care unit. She is going to be transferred to [facility] for possible evaluation for CABG versus complicated PCI/stent. Hospitalist services were contacted for medical management. The patient is still complaining of some mild chest pain, pressure, about 3/10 in intensity, not associated with any nausea, vomiting, fevers, chills, cough, or any other associated symptoms.   REVIEW OF SYSTEMS:  CONSTITUTIONAL: No documented fever. No weight gain, no weight loss.  EYES: No blurred or double vision.  ENT: No tinnitus. No postnasal drip. No redness of the oropharynx.  RESPIRATORY: No cough, no wheeze, no hemoptysis or dyspnea.  CARDIOVASCULAR: Positive chest pain. No orthopnea, no palpitations, or syncope.  GASTROINTESTINAL: No nausea, no vomiting, diarrhea. No abdominal pain. No melena or hematochezia.  GENITOURINARY: No dysuria or hematuria.  ENDOCRINE: No polyuria or nocturia. No heat or cold intolerance.  HEMATOLOGIC: No anemia, no bruising, no bleeding.  INTEGUMENTARY: No rashes or lesions.  MUSCULOSKELETAL: No arthritis. No swelling. No gout.  NEUROLOGIC: No numbness, tingling, or ataxia. No seizure-type activity.  PSYCHIATRIC: No anxiety or insomnia.  No ADD.   PAST MEDICAL HISTORY:  Consistent with history of hypertension, diabetes, hyperlipidemia, history of breast cancer.    ALLERGIES: No known drug allergies.   SOCIAL HISTORY: No smoking. No alcohol abuse. No illicit drug abuse. Lives at home with her husband.   FAMILY HISTORY: The patient's mother died from Alzheimer dementia. Father died from complications of congestive heart failure.   CURRENT MEDICATIONS: Currently unclear.   PHYSICAL EXAMINATION:  VITAL SIGNS: Temperature 98.4, pulse 76, respirations 17, blood pressure 180/62, sats 97% on room air.  GENERAL: The patient is a pleasant-appearing female in no apparent distress.  HEAD, EYES, EARS, NOSE AND THROAT: Atraumatic, normocephalic. Extraocular muscles are intact. Pupils are equal and reactive to light. Sclerae anicteric. No conjunctival injection. No pharyngeal erythema.  NECK: Supple. There is no jugular venous distention. No bruits, no lymphadenopathy, no thyromegaly.  HEART: Regular rate and rhythm. No murmurs, no rubs, no clicks.  LUNGS: Clear to auscultation bilaterally. No rales or rhonchi. No wheezes.  ABDOMEN: Soft, flat, and nontender, nondistended. Has good bowel sounds. No hepatosplenomegaly appreciated.  EXTREMITIES: No evidence of any cyanosis, clubbing, or peripheral edema. Has +2 pedal and radial pulses bilaterally.  NEUROLOGICAL: The patient is alert, awake and oriented x3 with no focal motor or sensory deficits appreciated bilaterally.  SKIN: Moist and warm with no rashes appreciated.  LYMPHATIC: There is no cervical or axillary lymphadenopathy.   LABORATORY DATA: Showed a serum glucose of 321, BUN 16, creatinine 1.3, sodium 133, potassium 3.8, chloride 95, bicarbonate 25, troponin greater than 40. White cell count 12.7, hemoglobin 13.9, hematocrit 42.0, platelet count 299,000.   INR is 1.1. The patient did have a chest x-ray done which showed no chronic changes without acute abnormality.   ASSESSMENT  AND PLAN: This is an 81 year old  female with history of hypertension, diabetes, hyperlipidemia, history of breast cancer, who has been having chest pain for the past 2 weeks, presented to the hospital with an episode ST-elevation myocardial infarction and is status post cardiac catheterization showing completely occluded left anterior descending. The patient is going to be transferred to [facility] for possible evaluation for coronary artery bypass graft versus complicated percutaneous coronary intervention and stent.  1.  ST-elevation myocardial infarction. The patient is status post catheterization showing completely occluded LAD. For now, continue supportive care with beta blockers, ACE inhibitor, statin, nitroglycerin and morphine. The patient is going to be going to [facility] for further evaluation. Continue supportive care for now. Cardiology is following the patient. 2.  Diabetes.  I would continue sliding scale insulin for now. Follow blood sugars. 3.  Hyperlipidemia. Continue atorvastatin.  4. Hypertension. Continue with her metoprolol. We will resume her valsartan and HCTZ when her blood pressure improves.  Her blood pressure is presently on the low side. 5.  History of breast cancer. This is currently in remission. Continue Femara. The patient's medication reconciliation is yet to be done.  When it is done, I will resume her other medications appropriately.    Thank you so much for the consultation.   Time spent on the consult is 45 minutes.   ____________________________ Belia Heman. Verdell Carmine, MD vjs:DT D: 07/06/2014 11:27:44 ET T: 07/06/2014 11:50:19 ET JOB#: [id number]  cc: Belia Heman. Verdell Carmine, MD, <Dictator> Henreitta Leber MD ELECTRONICALLY SIGNED 07/07/2014 17:23

## 2014-12-03 NOTE — Discharge Summary (Signed)
PATIENT NAME:  Lauren Best, Lauren Best MR#:  [id number] DATE OF BIRTH:  28-Jul-1933  DATE OF ADMISSION:  07/06/2014 DATE OF DISCHARGE:  07/06/2014   TRANSFER SUMMARY  REFERRING PHYSICIAN: Dion Body, MD  The patient came into the Emergency Room.   INDICATION: STEMI.  DISPOSITION: The patient was transferred on 07/06/2014 to [facility] for further evaluation and possible coronary artery bypass surgery, and heart failure treatment viability assessment.   HOSPITAL COURSE: The patient is a 81 year old female who presented with multiple medical problems including hypertension, diabetes, hyperlipidemia, obesity. She essentially had a history of staying at home with chest pain, waxing and waning in severity over the last 2 weeks. She thought it was indigestion, so did not come in. Finally, the night before admission, the pain was so bad she finally decided to come in and get evaluated. She was found in the ER to have an abnormal EKG, suggestive of an acute anterior wall myocardial infarction, but appeared to have been complete because of Q waves anteriorly. In addition, the persistent ST elevation was suggestive of aneurysm. The patient still had persistent pain so she taken to the cardiac catheterization lab for evaluation and possible STEMI intervention. She was given Plavix in the ER, as well as aspirin. Cardiac catheterization suggested complete occlusion of the LAD at the ostium with little or no collaterals. She had mild to moderate disease in the circumflex and the RCA. Overall LV function was severely depressed at about 25% with anterior apical akinesis. Intervention was deferred, so the patient was then set up for transfer to [facility] for further evaluation and possible coronary bypass surgery, as well as heart failure management. The case was discussed with Dion Body, MD, as well as the team at [facility].   The patient is being transferred today for further evaluation and management. Rehabilitation  potential is good. Diet was 1800 calorie ADA.   Medications on discharge were the same as her hospital medications on admission, which were Crestor, insulin 70/25. She was on calcium, Vitamin B12, iron, valsartan and hydrochlorothiazide.   The patient was stable on transfer, and was set up at [facility] for further evaluation and management.    ____________________________ Loran Senters. Clayborn Bigness, MD ddc:MT D: 07/06/2014 12:51:53 ET T: 07/06/2014 13:32:59 ET JOB#: [id number]  cc: Renesmae Donahey D. Clayborn Bigness, MD, <Dictator> Yolonda Kida MD ELECTRONICALLY SIGNED 07/15/2014 16:47

## 2014-12-03 NOTE — H&P (Signed)
PATIENT NAME:  Lauren Best, Lauren Best MR#:  [id number] DATE OF BIRTH:  [birth year]  DATE OF ADMISSION:  07/06/2014  PRIMARY PHYSICIAN:  Dion Body, MD.  CONSULTING PHYSICIAN: Dwayne D. Clayborn Bigness, M.D.   INDICATION: STEMI.   HISTORY OF PRESENT ILLNESS: The patient is a 81 year old female with history of breast cancer, reflux symptoms, mild obesity, hypertension, who had been having chest pain symptoms over the last 2 weeks or so.  She was concerned that it may be related to something she ate or reflux, so did not seek medical attention. She sat at home for over 2 weeks, but the night prior to admission, she states the pain was so bad, she finally decided to come into the Emergency Room.  Pain must have been a 9 or 10 at that time, midsternal, some nausea, but no real vomiting. Emergency Room doctor found that she had ST elevation anteriorly with Q waves anteriorly as well. Rate was about 90. Blood pressure was stable. Heart rate was stable.  Because of the abnormal EKG, a STEMI was called.  Cardiology saw the patient and evaluated the patient and took her to the catheter lab for evaluation. She was found to have occlusion of her LAD ostially, with little or no collaterals with severely depressed LV function to 25% with anterior apical akinesis. Subsequently, a troponin came back at 88.  After significant discussion, the patient was then transferred to CCU for further evaluation and management and intervention was deferred.   PAST MEDICAL HISTORY: Hypertension, anemia, breast cancer, arthritis, diabetes, had a Port-A-Cath on the left.   PAST SURGICAL HISTORY: Port-A-Cath, foot surgery, cataract surgery, total knee replacement.   MEDICATIONS: She was on valsartan 160 once a day, vitamin B12 once a day, tramadol 50 mg q. 4 p.r.n., Protonix 40 mg twice a day, metformin 500 mg twice a day, oxycodone 5 mg q. 4 p.r.n., magnesium oxide p.r.n., lactulose 10 grams 2 times a day as needed, which was 30 mL, regular  insulin sliding scale, HCTZ once a day, Humalog 75/25, 50 units twice a day, iron 325 twice a day, Femara 2.5 once a day, Dulcolax p.r.n., Crestor 10 mg once a day, calcium plus vitamin D twice a day, Tylenol p.r.n.   ALLERGIES: He states none.   FAMILY HISTORY: Essentially noncontributory.   SOCIAL HISTORY: Lives with her husband. Retired. No smoking or alcohol consumption.   REVIEW OF SYSTEMS: Denies blackout spells or syncope. She has had nausea, no vomiting. No fever, no chills, no sweats. No weight loss. No weight gain. No hemoptysis, hematemesis. Denied bright red blood per rectum. No vision change or hearing change. Denies sputum production or cough. The patient complains of abdominal discomfort, chest pain over the last 2 weeks.   PHYSICAL EXAMINATION: VITAL SIGNS: Blood pressure was 140/80, pulse of 80, respiratory rate of 16, afebrile.  HEENT: Normocephalic, atraumatic. Pupils equal and reactive to light.  NECK: Supple. No significant JVD, bruits or adenopathy.  LUNGS: Clear to auscultation and percussion. No significant wheezing, rhonchi, or rales.  HEART: Regular rate and rhythm. Positive S3, soft S4. Systolic ejection murmur at the apex. PMI nondisplaced.  ABDOMEN: Benign.  EXTREMITIES: Within normal limits.  NEUROLOGIC: Intact.  SKIN: Normal.   ELECTROCARDIOGRAM: Sinus rhythm, ST elevation anteriorly. No significant reciprocal depression, Q waves anteriorly.  LABORATORY DATA:  Troponin was greater than 40, glucose of 453, BUN 16, creatinine of 1.33. Sodium 133, potassium 3.8, chloride of 95, CO2 of 25. CK-MB was 122.  H and H  of 13.9 and 42. White count of 12.8, platelet count of 299,000. Coags are negative.   IMAGING DATA:  Chest x-ray essentially normal.  Cardiac catheterization showed significant left occlusion of the LAD, to suggest fairly depressed LV function, ejection fraction 25%, with anterior apical akinesis.   ASSESSMENT:  1.  ST-segment elevation myocardial  infarction. 2.  Complete anterior infarct. 3.  Cardiomyopathy, ischemic in nature.  4.  Hypertension.  5.  Diabetes.  6.  History of breast cancer. 7.  Hyperlipidemia. 8.  Obesity.   PLAN:   1.  Agree  invasive strategy would possible intervention.  Because of complete nature of infarct, will have to assess viability and make determination whether bypass with a LIMA to LAD would be advisable. 2.  Would recommend transfer the patient to [facility] for further assessment of viability and coronary artery bypass surgery.  3.  Have the patient seen by heart failure team at [facility] as well with anterior apical hypokinesis, akinesis.  4.  We will start ACE inhibitor, beta blocker, statin. Continue aspirin.  5.  The patient got Plavix as part of STEMI in anticipation of intervention. May continue Plavix until or unless surgery is recommended. 6.  Hyperlipidemia. She is on Crestor 10 mg a day.  We will  try to continue that. We initially wrote for Lipitor because the patient was not sure what medicine she was on, but I think switching back to Crestor would be advisable. We may adjust the dose upward.  7.  Diabetes. Continue current medicine and therapy. Hemoglobin A1c, fasting sugars as per primary.  8.  Obesity, recommend weight loss, exercise, portion control. Again, plan to transfer to [facility] for further evaluation and management. The patient was accepted by Dr. Earl Lites and fellow in the unit was Fudim.  Case was discussed with the primary attending, Dr. Netty Starring, as well as consults with Dr. Verdell Carmine, with [facility] for medical management.    ____________________________ Loran Senters. Clayborn Bigness, MD ddc:DT D: 07/06/2014 12:48:17 ET T: 07/06/2014 13:18:38 ET JOB#: [id number]  cc: Dwayne D. Clayborn Bigness, MD, <Dictator> Yolonda Kida MD ELECTRONICALLY SIGNED 07/15/2014 16:48

## 2014-12-03 NOTE — Consult Note (Signed)
Brief Consult Note: Diagnosis: 1. STEMI 2. HTN 3. DM 4. Hyperlipidemia 5. hx of Breast Cancer.   Patient was seen by consultant.   Consult note dictated.   Orders entered.   Discussed with Attending MD.   Comments: 81 yo female w/ hx of HTN, DM, Hyperlipidemia, hx of Breast Cancer who has been having chest pain for the past 2 weeks presented to the hospital w/ STEMI and is s/p cath showing completely occulded LAD.  Being Transferred to [facility] for further management, evaluation.    1. STEMI - s/p cath showing completely occluded LAD.  - cont. B-blocker, ACE, Statin. Nitro. Morphine  - going to [facility] for further eval.   2. DM - cont. SSI for now.    3. Hyperlipidemia - cont. Atorvastatin  4. hx of Breast Cancer - in remission.  - cont. Femara.   Will do Med Rec and resume other meds appropriately.   thanks for the consult.   Job # [id number].  Electronic Signatures: Henreitta Leber (MD)  (Signed [phone] 11:27)  Authored: Brief Consult Note   Last Updated: 25-Nov-15 11:27 by Henreitta Leber (MD)

## 2014-12-11 NOTE — Discharge Summary (Signed)
PATIENT NAME:  Lauren Best, SHORES MR#:  [id number] DATE OF BIRTH:  Nov 07, 1932  DATE OF ADMISSION:  10/14/2014 DATE OF DISCHARGE:  10/15/2014  PRESENTING COMPLAINT: Uncontrolled sugars.  DISCHARGE DIAGNOSES: 1. Type 2 diabetes, uncontrolled.  2. Urinary tract infection.  CODE STATUS: Full code.  MEDICATIONS:  1. Nitrostat 0.4 mg one sublingually as needed.  2. Lisinopril 2.5 mg daily.  3. Atorvastatin 40 mg daily.  4. Aspirin 81 mg daily.  5. Fish oil 1000 mg daily.  6. Multivitamin 1 tablet p.o. daily.  7. Humalog mix 75/25 at 40 units b.i.d.  8. Vitamin C 1 tablet daily.  9. Hydroxyzine 25 mg t.i.d. as needed.  10. Metoprolol 25 mg extended release half a tablet daily.  11. Lasix 20 mg daily.  12. Amiodarone 200 mg daily.  13. Plavix 75 mg daily.  14. Lecithin 1200 mg 2 capsules once a day.  15. Triamcinolone topical application to affected area as before.  16. Keflex 500 mg b.i.d.   DIET: Copper controlled diet.  Keep your scheduled appointment with Dr. Eddie Dibbles, endocrine, for Tuesday, March 8. Follow up with Dr. Netty Starring in 1 to 2 weeks.  Glucose at discharge was 191. H and H were 9.7 and 28.1. Creatinine 1.24. UA positive for UTI. Hemoglobin A1c is 15.  BRIEF SUMMARY OF HOSPITAL COURSE: Lauren Best is a pleasant, 81 year old, Caucasian female with history of type 2 diabetes, CAD, comes in with a recent non-ST MI, hyperlipidemia, comes in with:  1. Uncontrolled diabetes. She came in with sugars greater than 500, etiology likely could be in the setting of a UTI. She was given aggressive IV fluids. Her insulin 75/25 was resumed. A1c was 15. Sugars came down to 191 at discharge. She has a followup with Dr. Eddie Dibbles, endocrinology with [facility], and hence I did not make any adjustments in her insulin. She was feeling better, and we will have Dr. Eddie Dibbles look into her insulin regimen and see if adjustments can be made.  2. Hyponatremia, likely pseudohyponatremia in the setting  of elevated sugars, improved.  3. Acute renal failure, mild dehydration with elevated sugars. Received IV fluids.  4. UTI. The patient was placed on IV Rocephin, changed to p.o. Keflex.  5. History of ischemic cardiomyopathy, EF of 25%. Continued aspirin, beta blockers, Plavix Lisinopril was resumed at discharge.  Hospital stay otherwise remained stable. The patient remained a FULL CODE.   TIME SPENT: 40 minutes.  Discharge plan was discussed with the patient's husband.     ____________________________ Hart Rochester. Posey Pronto, MD sap:jh D: 10/16/2014 06:51:00 ET T: 10/16/2014 09:12:24 ET JOB#: [id number]  cc: Bhakti B. Eddie Dibbles, MD Dion Body, MD Olia Hinderliter A. Posey Pronto, MD, <Dictator>  Ilda Basset MD ELECTRONICALLY SIGNED 10/22/2014 14:26

## 2014-12-11 NOTE — Consult Note (Signed)
PATIENT NAME:  Lauren Best, Lauren Best MR#:  [id number] DATE OF BIRTH:  1933/04/16  DATE OF CONSULTATION:  08/25/2014  REFERRING PHYSICIAN:  Dion Body, MD  CONSULTING PHYSICIAN:  A. Lavone Orn, MD  ENDOCRINOLOGY CONSULTATION  CHIEF COMPLAINT:  Uncontrolled diabetes.   HISTORY OF PRESENT ILLNESS:  This is a 81 year old female with a history of ischemic cardiomyopathy and type 2 diabetes, who presented with shortness of breath and chest pressure yesterday. She has longstanding uncontrolled diabetes. Last hemoglobin A1c in October 2015 was 10.3%. She follows with Dr. Eddie Dibbles at [facility]. Outpatient insulin regimen includes Humalog 75/25 mix. She reports compliance with this regimen. She also reports that she is monitoring her blood sugars regularly and typically finds sugars are in the 180-280 range. She states they are always high. Denies any recent hypoglycemia. Of note, after hospitalization yesterday, the patient was started on NovoLog 70/30, 42 units at supper, and this morning she had hypoglycemia with a 5:00 a.m. blood sugar of 64. Her dose has now been reduced to 21 units twice daily. She is eating well. She denies any nausea or vomiting.   PAST MEDICAL HISTORY: 1.  Type 2 diabetes.  2.  Coronary artery disease.  3.  Ischemic cardiomyopathy (EF 25%).  4.  Hypertension.  5.  History of breast cancer.  6.  Osteoarthritis.   PAST SURGICAL HISTORY: 1.  Right mastectomy.  2.  Right knee surgery.   ALLERGIES:  No known drug allergies.   INPATIENT MEDICATIONS:  1.  NovoLog 70/30 mix 21 units twice daily.  2.  Calcium 500 mg daily.  3.  NovoLog insulin sliding scale 2 units per 50 over a target of 150.  4.  Atorvastatin 20 mg daily.  5.  Amiodarone 200 mg daily.  6.  Vitamin D 1000 units daily.  7.  B12, 1000 mcg daily.  8.  Imdur 30 mg daily.  9.  Toprol-XL 12.5 mg daily.  10.  Plavix 75 mg daily.   SOCIAL HISTORY:  The patient is married and lives with  husband. No alcohol or tobacco use.   FAMILY HISTORY:  Positive for diabetes and heart disease.   REVIEW OF SYSTEMS: GENERAL:  Denies fever. Reports fatigue.  HEENT:  Denies blurred vision.  NECK:  Denies neck pain or dysphagia.  CARDIAC:  Reports chest pain. Denies palpitations.  PULMONARY:  Reports dyspnea on exertion. Denies cough.  ABDOMEN:  Reports fair appetite. Denies recent change in bowel habits.  ENDOCRINE:  Denies heat or cold intolerance.  GENITOURINARY:  Denies dysuria or hematuria.  HEMATOLOGIC:  No easy bruisability or recent bleeding.  SKIN:  No rash. No recent skin changes.  MUSCULOSKELETAL:  She has had ankle swelling at times. Reports muscular weakness diffusely, nonfocal and not new.  NEUROLOGIC:  No recent falls. No headaches.   PHYSICAL EXAMINATION: VITAL SIGNS:  Height 62.9 inches, weight 162 pounds, BMI 28. Temperature 98.1, pulse 69, respirations 18, blood pressure 97/53, pulse oximetry 95% on 1 liter O2.  GENERAL:  White female in no acute distress.  HEENT:  Extraocular movements are intact. Oropharynx is clear. Mucous membranes are moist.  NECK:  Supple. No thyromegaly.  CARDIAC:  Regular rate and rhythm. There are 2+ DP pulses bilaterally.  PULMONARY:  Clear to auscultation bilaterally. No wheeze.  ABDOMEN:  Diffusely soft, nontender, nondistended.  EXTREMITIES:  No peripheral edema.  NEUROLOGIC:  Cranial nerves are intact. No focal deficits. Alert and oriented.  PSYCHIATRIC:  Calm, cooperative.   LABORATORY DATA:  Glucose 64, BUN 23, creatinine 1.4, sodium 137, potassium 4.3. EGFR 47. Hematocrit 33.2%, WBC 7.7, platelets 324,00.   ASSESSMENT: 1.  Uncontrolled type 2 diabetes with both hyper- and hypoglycemia.  2.  Ischemic cardiomyopathy.  3.  Arteriosclerotic cardiovascular disease.   PLAN: 1.  Agree with current dosing of insulin. The 42 units twice daily, which is her outpatient reported dose, does seem excessive given her hypoglycemia early this  morning.  2.  Continue current NovoLog insulin sliding scale with q.a.c. and at bedtime blood sugar monitoring.  3.  We will modify dosing of mixed insulin based on blood sugar patterns as needed.  4.  Continue low-carbohydrate diet.   Thank you for the kind request for consultation. I will follow along with you.    ____________________________ A. Lavone Orn, MD ams:nb D: 08/25/2014 21:12:56 ET T: 08/25/2014 21:54:12 ET JOB#: [id number]  cc: A. Lavone Orn, MD, <Dictator> Sherlon Handing MD ELECTRONICALLY SIGNED 09/06/2014 17:27

## 2014-12-11 NOTE — Consult Note (Signed)
Present Illness 81 yo female with history of ischemic cardiomyopathy secondary to stemi iin november of 2015 who has been treated at [facility] with cath revealing occluded lad with no evidence of viability in the anterior distribution and was treated medically for this. She also developed atrial flutter while i nthe hospital at [facility] and was placed on apixiban 2.5 mg bid. She was placed on amiodarone for her atrial flutter. Echo in november revealed ef of 25%. She was initially placed on ace i which caused a dry cough. She had mild to moderate occlusion of her left circumflex.  She was being considered for an aicd.for her ischemic cardiomyopathy. She was admitted after being sent to the er by her pcp after calling with chest tightness. In the er her serum troponin was 0.06. She has improved since admission. She reports she is CXR did not reveal any significant chf. She has been compliant with her meds and saw her primary cardioogist at [facility] approximately 1 week ago. She remains on apixaban at 2.5 mg bid; amiodarone 200 mg daily, plavix 75 daily, metoprolol succinate 25 mg daily, EKG today revealed nsr with no acute ischemic changes.   Physical Exam:  GEN no acute distress   HEENT PERRL   NECK No masses   RESP clear BS   CARD Regular rate and rhythm  Murmur   Murmur Systolic   Systolic Murmur axilla   ABD denies tenderness  normal BS  no Adominal Mass   LYMPH negative neck   EXTR negative cyanosis/clubbing, negative edema   SKIN normal to palpation   NEURO cranial nerves intact, motor/sensory function intact   PSYCH A+O to time, place, person   Review of Systems:  Subjective/Chief Complaint shortness of breath and chest tightness   General: Fatigue   Skin: No Complaints   ENT: No Complaints   Eyes: No Complaints   Neck: No Complaints   Respiratory: Short of breath   Cardiovascular: Tightness   Gastrointestinal: No Complaints   Genitourinary: No Complaints   Vascular: No  Complaints   Musculoskeletal: No Complaints   Neurologic: No Complaints   Hematologic: No Complaints   Endocrine: No Complaints   Psychiatric: No Complaints   Review of Systems: All other systems were reviewed and found to be negative   Medications/Allergies Reviewed Medications/Allergies reviewed   Family & Social History:  Family and Social History:  Family History Non-Contributory   Social History negative tobacco   Place of Living Home   EKG:  EKG NSR   Interpretation non specific st t wave changes    Glucophage: Rash   Impression 81 yo female with history of ischemic cardiomyopathy s/p anterior stemi in 11/12 treated medically due to no viability in the anteior wall with insignificant disease in her left circumflex distribution treated medically. She has an ejection fraction of 25%. She is on apixaban for an episode of atrial flutter during her admission. She has no documented afib since then. She remains on asa, clopidigrel and apixiban since then. She is admitted iwth some chest tightness with borderline troponin elevation. No ishcmeic changes on her ekg. Will need consideration for aicd for primary prevention. Will discontinue apixaban and contine with dual antiplatelet therapy. Will continue with amiodarone at woo mg dialy. Continue to rule out for mi. No evidence of chf on cxr. Pt has chornic systollic heart failure. Would consider proceding with aicd placement  .  Will contineu with metoprolol succinate 25 mg daily. Does not appear to require invasive  evalluaton at present.   Plan 1. Continue amiodarone, metoprolol, asa, clopidogrel 2. Defer spirononlactone for now due to hypotension 3. Discontinue apixaban 4. Continue atorvastatin 5. Rule out for mi 6. Consider aicd when off apixiban for 2-3 days. 7. Further recs pending course.   Electronic Signatures: Teodoro Spray (MD)  (Signed 13-Jan-16 18:57)  Authored: General Aspect/Present Illness, History and  Physical Exam, Review of System, Family & Social History, EKG , Allergies, Impression/Plan   Last Updated: 13-Jan-16 18:57 by Teodoro Spray (MD)

## 2014-12-11 NOTE — H&P (Signed)
PATIENT NAME:  Lauren Best, Lauren Best MR#:  [id number] DATE OF BIRTH:  [birth year]  DATE OF ADMISSION:  10/14/2014  DATE OF ADMISSION: 10/14/2014.   PRIMARY CARE PHYSICIAN: Dion Body, MD  CHIEF COMPLAINT: Elevated blood sugars.   HISTORY OF PRESENT ILLNESS: This is a 81 year old female who was sent over from her primary care physician's office due to uncontrolled blood sugars. The patient says that she has a glucometer at home, but it is not really functioning very well. Her blood sugars have not been checked appropriately now for the past few days. Her blood sugars normally run in the 200s. She went to lab today to get a routine blood work, and the lab called her and her blood sugars were over 500. She came to the ER. The patient received some IV fluids and some insulin but her blood sugars still continued to be over 500 to 600. Hospitalist services were contacted for further treatment and evaluation. As per the patient and the husband, the patient has been complaining of some mild dysuria. Her urinalysis is positive for urinary tract infection. She denies any chest pain, shortness of breath, any abdominal pain, any nausea, vomiting, or any other associated symptoms presently.   REVIEW OF SYSTEMS:  CONSTITUTIONAL: No documented fever. No weight gain, no weight loss.  EYES: No blurry or double vision.  ENT: No tinnitus or postnasal drip. No redness of the oropharynx.  RESPIRATORY: No cough, no wheeze, no hemoptysis or dyspnea.  CARDIOVASCULAR: No chest pain, no orthopnea, no palpitations, no syncope.  GASTROINTESTINAL: No nausea, no vomiting, diarrhea. No abdominal pain. No melena or hematochezia.  GENITOURINARY: Positive dysuria, no hematuria.  ENDOCRINE: No polyuria, nocturia, heat or cold intolerance. HEMATOLOGIC: No bruising, no bleeding.  INTEGUMENTARY: No rashes or lesions.  MUSCULOSKELETAL: No arthritis. No swelling. No gout.  NEUROLOGIC: No numbness or tingling. No ataxia. No  seizure-type activity.  PSYCHIATRIC: No anxiety. No insomnia. No ADD.   PAST MEDICAL HISTORY: Consistent with ischemic cardiomyopathy; ejection fraction of 25%, history of recent non-ST-elevation MI, hypertension, hyperlipidemia, history of breast cancer.   ALLERGIES: GLUCOPHAGE.   SOCIAL HISTORY: No smoking. No alcohol abuse. No illicit drug abuse. Lives with her husband.   FAMILY HISTORY: Mother and father both deceased. Mother died from Alzheimer dementia. Father had congestive heart failure.   CURRENT MEDICATIONS: As follows: Amiodarone 200 mg daily, aspirin 81 mg daily, atorvastatin 40 mg daily, Plavix 75 mg daily, fish oil 1000 mg daily, Lasix 20 mg daily, Humalog 75/25 at 40 units b.i.d., hydroxyzine 25 mg t.i.d. as needed, lecithin 1200 mg 2 caps daily, lisinopril 2.5 mg daily, Toprol 25 mg 1/2 tablet daily, multivitamin daily, sublingual  nitroglycerin as needed, triamcinolone cream to be applied b.i.d. as needed, vitamin C 1 tablet daily.   PHYSICAL EXAMINATION: Presently is as follows:  VITAL SIGNS: Temperature 98.3, pulse 60, respirations 18, blood pressure 125/40, saturations 99% on room air.  GENERAL: The patient is a pleasant-appearing female but in no apparent distress.  HEENT: Atraumatic, normocephalic. Extraocular muscles are intact. Pupils equal, round and reactive round to light. Sclerae anicteric. No conjunctival injection. No pharyngeal erythema. Dry oral mucosa.  NECK: Supple. There is no jugular venous distention. No bruits, no lymphadenopathy, no thyromegaly.  HEART: Regular rate and rhythm. No murmurs, no rubs, no clicks.  LUNGS: Show clear to auscultation bilaterally. No rales or rhonchi. No wheezes.  ABDOMEN: Soft, flat, nontender, nondistended. Has good bowel sounds. No hepatosplenomegaly appreciated.  EXTREMITIES: No evidence of any cyanosis, clubbing  or peripheral edema. There are +2 pedal and radial pulses bilaterally.  NEUROLOGIC: The patient is alert, awake,  oriented x3. No focal motor or sensory deficits appreciated bilaterally.  SKIN: Moist and warm with no rashes.  LYMPHATIC: There is no cervical or axillary lymphadenopathy.   LABORATORY DATA: Serum glucose of 639, BUN 33, creatinine 1.5, sodium 125, potassium 4.6, chloride 90. Troponin 0.03, white cell count 9.9, hemoglobin 10.6, hematocrit 32.9, platelet count 325,000. Urinalysis shows positive nitrites with 10 white cells and 2+ bacteria.   ASSESSMENT AND PLAN: This is a 81 year old female with history of ischemic cardiomyopathy, ejection fraction of 25%, recent non-ST-elevation myocardial infarction hypertension, hyperlipidemia, history of breast cancer who presents to the hospital due to significantly elevated blood sugars and uncontrolled diabetes.  1. Uncontrolled diabetes. The exact etiology of this is unclear. The patient did present to the hospital with blood sugars greater than 500. I suspect this possibly could be related to her underlying urinary tract infection. For now, will aggressively hydrate her with IV fluids, continue her insulin 75/25 and add some sliding scale insulin coverage, follow blood sugars. If needed, place her on an insulin drip on DKA protocol. Will check a hemoglobin A1c. Will get a diabetic lifestyle assessment.  2. Hyponatremia. This was likely pseudohyponatremia due to hyperglycemia should; improve as her sugars correct.  3. Acute renal failure. This is likely secondary to dehydration and elevated blood sugars. I will hydrate her with IV fluids, follow her BUN and creatinine and urine output, renal dose medications, avoid nephrotoxins. Hold ACE inhibitor and Lasix for now.  4. Urinary tract infection. Will give her IV ceftriaxone, follow urine cultures.  5. History of ischemic cardiomyopathy, ejection fraction of 25%. The patient clinically is not in congestive heart failure. I will continue her aspirin, Plavix and beta blocker for now. Hold ACE inhibitor due to the  acute renal failure. Hold her Lasix for now.   CODE STATUS: The patient is a full code.   TIME SPEND ON ADMISSION: 50 minutes.    ____________________________ Belia Heman. Verdell Carmine, MD [id number] D: 10/14/2014 20:01:00 ET T: 10/14/2014 20:15:41 ET JOB#: [id number]  cc: Belia Heman. Verdell Carmine, MD, <Dictator> Henreitta Leber MD ELECTRONICALLY SIGNED 10/24/2014 14:34

## 2014-12-11 NOTE — Discharge Summary (Signed)
PATIENT NAME:  JULICIA, KRIEGER MR#:  [id number] DATE OF BIRTH:  [birth year]  DATE OF ADMISSION:  08/24/2014 DATE OF DISCHARGE:  08/27/2014  HISTORY OF PRESENT ILLNESS: Ms. Lauren Best was a 81 year old white lady followed by Dr. Dion Body who presented with chest pain. The patient was known to be hypertensive and diabetic. She also had known coronary artery disease and had suffered a myocardial infarction in November of 2015. She was on medical management. She was followed primarily at [facility]. She had a known ejection fraction of 25%.   For the patient's past medical history, medications, allergies and admission physical exam, please see the dictated admission note.   HOSPITAL COURSE: The patient was admitted to the regular medical floor where she was placed on telemetry. Serial cardiac enzymes were obtained which ruled out for myocardial infarction. She was seen in consultation by Dr. Ubaldo Glassing of cardiology. There was a question whether or not she would have an implantable defibrillator placed at the time of the present admission, but it was finally decided for her to be discharged to be followed up by her physicians at [facility] to make that decision. At the time of discharge, she was placed back on her anticoagulant. It was the recommendation of Dr. Ubaldo Glassing that she be put back on apixaban at 2.5 mg b.i.d. The patient was not felt to be a surgical candidate even though she was known to have a 75% stenosis of an occluded LAD due to non-viability of her anterior wall.   DISCHARGE DIAGNOSIS: Ischemic cardiomyopathy.   DISCHARGE MEDICATIONS: 1.  Nitro-Stat 0.4 mg sublingually p.r.n.  2.  Metoprolol succinate 25 mg twice a day.  3.  Eliquis 2.5 mg b.i.d.  4.  Plavix 75 mg daily.  5.  Amiodarone 200 mg daily.  6.  Vitamin B12 tablet 1,000 mcg daily.  7.  Calcium carbonate 600 mg plus vitamin D 1 tablet daily.  8.  Humalog mix 75/25 42 units subcutaneously twice a day.  9.  Lisinopril 2.5 mg daily.  10.   Atorvastatin 40 mg daily.   DISCHARGE INSTRUCTIONS: The patient was discharged on a low-sodium, low-fat, carbohydrate-controlled diet. She is to have activity as tolerated. She is to follow up with her cardiologist at [facility] in 2 to 4 weeks.   ____________________________ Hewitt Blade Sarina Ser, MD [id number] D: 09/12/2014 07:40:36 ET T: 09/12/2014 08:15:41 ET JOB#: [id number]  cc: Jenny Reichmann B. Sarina Ser, MD, <Dictator> Dion Body, MD Lottie Mussel III MD ELECTRONICALLY SIGNED 09/22/2014 17:48

## 2014-12-11 NOTE — H&P (Signed)
PATIENT NAME:  Lauren Best, Lauren Best MR#:  [id number] DATE OF BIRTH:  17-Apr-1933  DATE OF ADMISSION:  08/24/2014  ADMITTING PHYSICIAN:  Gladstone Lighter, MD  PRIMARY CARE PHYSICIAN: Dion Body, MD  PRIMARY CARDIOLOGIST: At [facility]   CHIEF COMPLAINT: Chest pressure, difficulty breathing.   HISTORY OF PRESENT ILLNESS: Lauren Best is a 81 year old Caucasian female with past medical history significant for coronary artery disease status post STEMI in November 2015, cardiac catheterization showing 100% LAD occlusion, transferred to [facility] to consider bypass; however, was treated medically and discharged home, history of ischemic cardiomyopathy, congestive heart failure, EF of only 25%, history of cardiac arrhythmia, hypertension, insulin-dependent diabetes mellitus, who comes today from home secondary to worsening breathing that started today and also chest pressure worsened. The patient states since her heart attack in November 2015, she always felt a tightness in her chest which has been stable. Yesterday, she had a very good day. She worked more than her usual self, has been pretty active, she even thinks she might have over-worked herself. This morning, she woke up, she was having intense pressure in her chest for which she took sublingual nitroglycerin tablet, which she had never done since her heart attack. That relieved her symptoms a little bit but she was still having worsening dyspnea even at rest and presented to the ER. Her first troponin is mildly elevated at 0.06 and she appears comfortable at this time.   PAST MEDICAL HISTORY:  1. Coronary artery disease with 100% LAD occlusion in November 2015, on medical management at this time.  2. Ischemic cardiomyopathy.  3. Systolic CHF, EF of 75%.  4. Hypertension.  5. Insulin-dependent diabetes mellitus.  6. History of breast cancer.   PAST SURGICAL HISTORY:  1. Right mastectomy with lumpectomy done.  2. Right knee surgery done.   ALLERGIES:  GLUCOPHAGE CAUSED A RASH.   CURRENT HOME MEDICATIONS: 1. Eliquis 2.5 mg p.o. b.i.d.  2. Plavix 75 mg p.o. daily.  3. Sublingual nitroglycerin 0.4 mg p.r.n. for chest pain.  4. Toprol 12.5 mg p.o. daily.  5. Amiodarone 200 mg p.o. daily.  6. Humalog 75/25 Quikpen, 42 units subcutaneously twice a day. 7. Calcium carbonate 600 mg p.o. daily.  8. Vitamin B12 1000 mcg p.o. daily.  9. Vitamin D3 1 tablet p.o. daily.   SOCIAL HISTORY:  Lives at home with her husband. No smoking or alcohol use.   FAMILY HISTORY: Significant congestive heart failure in father, and mom with Alzheimer's dementia.   REVIEW OF SYSTEMS: CONSTITUTIONAL: No fever, fatigue, or weakness.  EYES: No blurred vision, double vision, inflammation or glaucoma.  ENT: No tinnitus, ear pain, hearing loss, epistaxis or discharge.  RESPIRATORY: No cough, wheeze, hemoptysis, or COPD.  CARDIOVASCULAR: Positive for chest pressure and dyspnea on exertion. No palpitations, syncope, or edema, or atrial fibrillation.  GASTROINTESTINAL: No nausea, vomiting, diarrhea, abdominal pain, hematemesis, or melena.  GENITOURINARY: No dysuria, hematuria, renal calculus, frequency, or incontinence.  ENDOCRINE: No polyuria, nocturia, thyroid problems, heat or cold intolerance.  HEMATOLOGY: No anemia, easy bruising or bleeding.  SKIN: No acne, rash or lesions.  MUSCULOSKELETAL: No neck fracture, pain, arthritis or gout.  NEUROLOGIC: No numbness, weakness, CVA, TIA or seizures.  PSYCHOLOGICAL: No anxiety, insomnia, or depression.   PHYSICAL EXAMINATION:  VITAL SIGNS: Temperature 97.6 degrees Fahrenheit, pulse 81, respirations 18, blood pressure 134/91, pulse ox 98% on room air.  GENERAL: Well-built, well-nourished female lying in bed, very anxious, not in any acute distress.  HEENT: Normocephalic, atraumatic. Pupils equal,  round, reacting to light. Anicteric sclerae. Extraocular movements intact. Oropharynx clear without erythema, mass, or exudates.   NECK: Supple. No thyromegaly, JVD or carotid bruits. No lymphadenopathy.  LUNGS: Moving air bilaterally. No wheeze or crackles. No use of accessory muscles for breathing.  CARDIOVASCULAR: S1, S2, regular rate and rhythm. 3/6 systolic murmur present. No rubs or gallops.  ABDOMEN: Soft, nontender, nondistended. No hepatosplenomegaly. Normal bowel sounds.  EXTREMITIES: No pedal edema. No clubbing or cyanosis, 2+ dorsalis pedis pulses palpable bilaterally. LYMPHATICS: No cervical lymphadenopathy.  NEUROLOGIC: Cranial nerves intact. No focal motor or sensory deficits.  PSYCHOLOGICAL: The patient is awake, alert, oriented x3.   LABORATORY DATA: WBC 7.6, hemoglobin 12.7, hematocrit 38.7, platelet count 361,000.   Sodium 132, potassium 4.5, chloride 97, bicarbonate 24, BUN 22, creatinine 1.5, glucose 405, and calcium of 9.3.   ALT 38, AST 45, alk phos 226, total bili 0.4 and albumin of 2.9, CK 114, CK-MB 2.0, troponin 0.06.   Chest x-ray:  Clear lung fields, stable mild chronic disease. No acute cardiopulmonary process.   EKG showing normal sinus rhythm, heart rate of 81, Q-wave noted in the anterior leads, no acute ST or T-wave changes.   ASSESSMENT AND PLAN: A 81 year old female with recent coronary artery disease with 100% left anterior descending blockage on medical management, ischemic cardiomyopathy, ejection fraction of 25%, hypertension, diabetes, admitted for chest pain and mildly elevated troponin.  1. Unstable angina versus non-ST elevation myocardial infarction, with mildly elevated first troponin. Admit to telemetry, trend troponins, on Imdur and Toprol, Imdur has been added this admission.  2. Recent cardiac catheterization in  November 2015 with 100% left anterior descending occlusion, transferred to [facility], no surgery was done because she was a high-risk candidate and medical management being done. Further rise in troponins, then we will add IV heparin. For now, hold off, get a  cardiology consult. Oxygen support. Will leave it up to cardiology whether to repeat an echocardiogram or not. Continue Eliquis, Plavix, Toprol, and statin and Imdur were added.  3. Ischemic cardiomyopathy, ejection fraction of 25%. Lasix just stopped last week due  to hyponatremia, worsening renal function and dehydration. Chest x-ray appears clear clinically, well compensated, so we will hold off Lasix for now. Repeat echocardiogram as per cardiology recommendation. Hold off on ACE or ARB due to low blood pressure now with acute renal failure.  4. Hypertension, on Toprol and Imdur.  5. Insulin-dependent diabetes mellitus. Novolog 70/30 insulin b.i.d. and sliding scale insulin.  6. Possible atrial fibrillation versus arrhythmia at [facility] for which she was started on amiodarone and Eliquis. We will continue that. According to the family, they had planned for defibrillator so not sure if she had ventricular tachycardia episode over there.  7. Deep vein thrombosis prophylaxis.  8. History of breast cancer, in remission, being followed by Dr. Ma Hillock.   CODE STATUS: Full code.   TIME SPENT ON ADMISSION: 50 minutes.     ____________________________ Gladstone Lighter, MD [id number] D: 08/24/2014 14:59:52 ET T: 08/24/2014 16:11:37 ET JOB#: [id number]  cc: Gladstone Lighter, MD, <Dictator> Dion Body, MD Gladstone Lighter MD ELECTRONICALLY SIGNED 08/29/2014 16:17

## 2014-12-11 NOTE — Discharge Summary (Signed)
PATIENT NAME:  Lauren Best, Lauren Best MR#:  [id number] DATE OF BIRTH:  27-Mar-1933  DATE OF ADMISSION:  10/14/2014 DATE OF DISCHARGE:  10/15/2014  PRESENTING COMPLAINT: Uncontrolled sugars.  DISCHARGE DIAGNOSES: 1. Type 2 diabetes, uncontrolled.  2. Urinary tract infection.  CODE STATUS: Full code.  MEDICATIONS:  1. Nitrostat 0.4 mg one sublingually as needed.  2. Lisinopril 2.5 mg daily.  3. Atorvastatin 40 mg daily.  4. Aspirin 81 mg daily.  5. Fish oil 1000 mg daily.  6. Multivitamin 1 tablet p.o. daily.  7. Humalog mix 75/25 at 40 units b.i.d.  8. Vitamin C 1 tablet daily.  9. Hydroxyzine 25 mg t.i.d. as needed.  10. Metoprolol 25 mg extended release half a tablet daily.  11. Lasix 20 mg daily.  12. Amiodarone 200 mg daily.  13. Plavix 75 mg daily.  14. Lecithin 1200 mg 2 capsules once a day.  15. Triamcinolone topical application to affected area as before.  16. Keflex 500 mg b.i.d.   DIET: Copper controlled diet.  Keep your scheduled appointment with Dr. Eddie Dibbles, endocrine, for Tuesday, March 8. Follow up with Dr. Netty Starring in 1 to 2 weeks.  Glucose at discharge was 191. H and H were 9.7 and 28.1. Creatinine 1.24. UA positive for UTI. Hemoglobin A1c is 15.  BRIEF SUMMARY OF HOSPITAL COURSE: Lauren Best is a pleasant, 81 year old, Caucasian female with history of type 2 diabetes, CAD, comes in with a recent non-ST MI, hyperlipidemia, comes in with:  1. Uncontrolled diabetes. She came in with sugars greater than 500, etiology likely could be in the setting of a UTI. She was given aggressive IV fluids. Her insulin 75/25 was resumed. A1c was 15. Sugars came down to 191 at discharge. She has a followup with Dr. Eddie Dibbles, endocrinology with [facility], and hence I did not make any adjustments in her insulin. She was feeling better, and we will have Dr. Eddie Dibbles look into her insulin regimen and see if adjustments can be made.  2. Hyponatremia, likely pseudohyponatremia in the setting  of elevated sugars, improved.  3. Acute renal failure, mild dehydration with elevated sugars. Received IV fluids.  4. UTI. The patient was placed on IV Rocephin, changed to p.o. Keflex.  5. History of ischemic cardiomyopathy, EF of 25%. Continued aspirin, beta blockers, Plavix Lisinopril was resumed at discharge.  Hospital stay otherwise remained stable. The patient remained a FULL CODE.   TIME SPENT: 40 minutes.  Discharge plan was discussed with the patient's husband.     ____________________________ CHANGE THIS PROVIDER TO DICTATE ID [id number] D: 10/16/2014 06:51:16 ET T: 10/16/2014 09:12:24 ET JOB#: [id number]  cc: CHANGE THIS PROVIDER TO DICTATE ID [id number], <Dictator> Bhakti B. Eddie Dibbles, MD Dion Body, MD

## 2015-01-09 ENCOUNTER — Emergency Department
Admission: EM | Admit: 2015-01-09 | Discharge: 2015-01-09 | Disposition: A | Discharge status: Home / Self Care | Payer: Medicare Other | Attending: Emergency Medicine | Admitting: Emergency Medicine

## 2015-01-09 ENCOUNTER — Encounter: Payer: Self-pay | Admitting: Emergency Medicine

## 2015-01-09 ENCOUNTER — Emergency Department: Payer: Medicare Other

## 2015-01-09 DIAGNOSIS — Y9389 Activity, other specified: Secondary | ICD-10-CM | POA: Diagnosis not present

## 2015-01-09 DIAGNOSIS — Y9289 Other specified places as the place of occurrence of the external cause: Secondary | ICD-10-CM | POA: Insufficient documentation

## 2015-01-09 DIAGNOSIS — S46911A Strain of unspecified muscle, fascia and tendon at shoulder and upper arm level, right arm, initial encounter: Secondary | ICD-10-CM | POA: Insufficient documentation

## 2015-01-09 DIAGNOSIS — E119 Type 2 diabetes mellitus without complications: Secondary | ICD-10-CM | POA: Insufficient documentation

## 2015-01-09 DIAGNOSIS — W1839XA Other fall on same level, initial encounter: Secondary | ICD-10-CM | POA: Diagnosis not present

## 2015-01-09 DIAGNOSIS — I10 Essential (primary) hypertension: Secondary | ICD-10-CM | POA: Insufficient documentation

## 2015-01-09 DIAGNOSIS — Y998 Other external cause status: Secondary | ICD-10-CM | POA: Insufficient documentation

## 2015-01-09 DIAGNOSIS — S4991XA Unspecified injury of right shoulder and upper arm, initial encounter: Secondary | ICD-10-CM | POA: Diagnosis present

## 2015-01-09 HISTORY — DX: Essential (primary) hypertension: I10

## 2015-01-09 HISTORY — DX: Type 2 diabetes mellitus without complications: E11.9

## 2015-01-09 HISTORY — DX: Malignant (primary) neoplasm, unspecified: C80.1

## 2015-01-09 HISTORY — DX: Atherosclerotic heart disease of native coronary artery without angina pectoris: I25.10

## 2015-01-09 MED ORDER — TRAMADOL HCL 50 MG PO TABS
50.0000 mg | ORAL_TABLET | Freq: Once | ORAL | Status: AC
  Administered 2015-01-09: 50 mg via ORAL

## 2015-01-09 MED ORDER — TRAMADOL HCL 50 MG PO TABS: 50.0000 mg | ORAL_TABLET | Freq: Three times a day (TID) | ORAL | Status: DC | PRN

## 2015-01-09 MED ORDER — TRAMADOL HCL 50 MG PO TABS
ORAL_TABLET | ORAL | Status: AC
Start: 2015-01-09 — End: 2015-01-09
  Administered 2015-01-09: 50 mg via ORAL
  Filled 2015-01-09: qty 1

## 2015-01-09 NOTE — ED Notes (Signed)
Pt states last Thursday she became dizzy and fell landing on right shoulder, states she has not been dizzy since but continues to have right shoulder pain, states pain is worse when she moves it

## 2015-01-09 NOTE — ED Provider Notes (Signed)
[facility] Emergency Department Provider Note  ____________________________________________  Time seen: Approximately 3:30 PM  I have reviewed the triage vital signs and the nursing notes.   HISTORY  Chief Complaint Fall and Shoulder Pain    HPI Lauren Best is a 81 y.o. female who presents to the emergency department for right shoulder pain. She states she fell about 5 days ago while trying to answer the door. She states that she has vertigo and she stood up too quickly and became dizzy. She fell backward and landed directly on the right shoulder. She states the pain has continued to worsen over the past 4 or 5 days. She has not taken anything today for pain. This is her first visit since her fall.   Past Medical History  Diagnosis Date  . Diabetes mellitus without complication   . Hypertension   . Coronary artery disease   . Cancer     There are no active problems to display for this patient.   Past Surgical History  Procedure Laterality Date  . Cardiac defibrillator placement Left   . Mastectomy Right   . Joint replacement Right     knee    Current Outpatient Rx  Name  Route  Sig  Dispense  Refill  . traMADol (ULTRAM) 50 MG tablet   Oral   Take 1 tablet (50 mg total) by mouth every 8 (eight) hours as needed.   12 tablet   0     Allergies Review of patient's allergies indicates no known allergies.  No family history on file.  Social History History  Substance Use Topics  . Smoking status: Never Smoker   . Smokeless tobacco: Not on file  . Alcohol Use: No    Review of Systems Constitutional: No fever/chills Eyes: No visual changes. ENT: No sore throat. Cardiovascular: Denies chest pain. Respiratory: Denies shortness of breath. Gastrointestinal: No abdominal pain.   Genitourinary: Negative for dysuria. Musculoskeletal: Negative for back pain. Right shoulder pain Skin: Negative for rash. Neurological: Negative for  headaches, focal weakness or numbness.  10-point ROS otherwise negative.  ____________________________________________   PHYSICAL EXAM:  VITAL SIGNS: ED Triage Vitals  Enc Vitals Group     BP 01/09/15 1457 163/51 mmHg     Pulse Rate 01/09/15 1457 92     Resp 01/09/15 1457 16     Temp 01/09/15 1457 98.3 F (36.8 C)     Temp Source 01/09/15 1457 Oral     SpO2 01/09/15 1457 95 %     Weight 01/09/15 1457 163 lb (73.936 kg)     Height 01/09/15 1457 5\' 3"  (1.6 m)     Head Cir --      Peak Flow --      Pain Score 01/09/15 1512 8     Pain Loc --      Pain Edu? --      Excl. in [place]? --     Constitutional: Alert and oriented. Well appearing and in no acute distress. Eyes: Conjunctivae are normal. PERRL. EOMI. Head: Atraumatic. Nose: No congestion/rhinnorhea. Mouth/Throat: Mucous membranes are moist.   Neck: No stridor.   Cardiovascular: Normal rate, regular rhythm. Grossly normal heart sounds.  Good peripheral circulation. Respiratory: Normal respiratory effort.  Gastrointestinal: Soft and nontender. No distention. No abdominal bruits. No CVA tenderness. Musculoskeletal: Tenderness present over the right humeral head. Limited range of motion due to pain. No swelling or obvious deformity. Neurologic:  Normal speech and language. No gross focal neurologic  deficits are appreciated. Speech is normal. No gait instability. Skin:  Atraumatic Psychiatric: Mood and affect are normal. Speech and behavior are normal.  ____________________________________________   LABS (all labs ordered are listed, but only abnormal results are displayed)  Labs Reviewed - No data to display ____________________________________________  EKG   ____________________________________________  RADIOLOGY  Right shoulder negative for fracture. ____________________________________________   PROCEDURES  Procedure(s) performed: None  Critical Care performed:  No  ____________________________________________   INITIAL IMPRESSION / ASSESSMENT AND PLAN / ED COURSE  Pertinent labs & imaging results that were available during my care of the patient were reviewed by me and considered in my medical decision making (see chart for details).  Initial impression: Shoulder strain versus fracture of the right humeral head. Pain medication ordered. We will x-ray the right shoulder. ----------------------------------------- 4:13 PM on 01/09/2015 -----------------------------------------  X-ray negative for fracture. Sling ordered. Patient was advised to only wear the sling when up and moving around. She was advised to remove the sling when sitting and at bedtime. She was advised to follow-up with orthopedic doctor of her choice for symptoms that are not improving over the next few days. ____________________________________________   FINAL CLINICAL IMPRESSION(S) / ED DIAGNOSES  Final diagnoses:  Shoulder strain, right, initial encounter      Victorino Dike, FNP 01/09/15 [place], MD 01/09/15 2329

## 2015-01-16 DIAGNOSIS — Z95810 Presence of automatic (implantable) cardiac defibrillator: Secondary | ICD-10-CM | POA: Insufficient documentation

## 2015-01-31 ENCOUNTER — Emergency Department: Payer: Medicare Other

## 2015-01-31 ENCOUNTER — Encounter: Payer: Self-pay | Admitting: Emergency Medicine

## 2015-01-31 ENCOUNTER — Emergency Department
Admission: EM | Admit: 2015-01-31 | Discharge: 2015-01-31 | Disposition: A | Discharge status: Home / Self Care | Payer: Medicare Other | Attending: Emergency Medicine | Admitting: Emergency Medicine

## 2015-01-31 DIAGNOSIS — E119 Type 2 diabetes mellitus without complications: Secondary | ICD-10-CM | POA: Diagnosis not present

## 2015-01-31 DIAGNOSIS — I10 Essential (primary) hypertension: Secondary | ICD-10-CM | POA: Insufficient documentation

## 2015-01-31 DIAGNOSIS — Z79899 Other long term (current) drug therapy: Secondary | ICD-10-CM | POA: Diagnosis not present

## 2015-01-31 DIAGNOSIS — R531 Weakness: Secondary | ICD-10-CM | POA: Diagnosis present

## 2015-01-31 DIAGNOSIS — Z7902 Long term (current) use of antithrombotics/antiplatelets: Secondary | ICD-10-CM | POA: Insufficient documentation

## 2015-01-31 DIAGNOSIS — Z794 Long term (current) use of insulin: Secondary | ICD-10-CM | POA: Insufficient documentation

## 2015-01-31 DIAGNOSIS — J159 Unspecified bacterial pneumonia: Secondary | ICD-10-CM | POA: Diagnosis not present

## 2015-01-31 DIAGNOSIS — Z7982 Long term (current) use of aspirin: Secondary | ICD-10-CM | POA: Diagnosis not present

## 2015-01-31 DIAGNOSIS — J189 Pneumonia, unspecified organism: Secondary | ICD-10-CM

## 2015-01-31 LAB — COMPREHENSIVE METABOLIC PANEL
ALBUMIN: 2.7 g/dL — AB (ref 3.5–5.0)
ALT: 85 U/L — ABNORMAL HIGH (ref 14–54)
AST: 126 U/L — AB (ref 15–41)
Alkaline Phosphatase: 193 U/L — ABNORMAL HIGH (ref 38–126)
Anion gap: 11 (ref 5–15)
BILIRUBIN TOTAL: 0.8 mg/dL (ref 0.3–1.2)
BUN: 25 mg/dL — ABNORMAL HIGH (ref 6–20)
CHLORIDE: 100 mmol/L — AB (ref 101–111)
CO2: 20 mmol/L — ABNORMAL LOW (ref 22–32)
Calcium: 8.3 mg/dL — ABNORMAL LOW (ref 8.9–10.3)
Creatinine, Ser: 1.27 mg/dL — ABNORMAL HIGH (ref 0.44–1.00)
GFR calc Af Amer: 45 mL/min — ABNORMAL LOW (ref >60)
GFR calc non Af Amer: 39 mL/min — ABNORMAL LOW (ref >60)
GLUCOSE: 150 mg/dL — AB (ref 65–99)
POTASSIUM: 4.2 mmol/L (ref 3.5–5.1)
SODIUM: 131 mmol/L — AB (ref 135–145)
Total Protein: 7 g/dL (ref 6.5–8.1)

## 2015-01-31 LAB — TROPONIN I: Troponin I: 0.05 ng/mL — ABNORMAL HIGH (ref <0.031)

## 2015-01-31 LAB — MAGNESIUM: Magnesium: 1.9 mg/dL (ref 1.7–2.4)

## 2015-01-31 MED ORDER — LEVOFLOXACIN 250 MG PO TABS
ORAL_TABLET | ORAL | Status: AC
  Filled 2015-01-31: qty 1

## 2015-01-31 MED ORDER — LEVOFLOXACIN 750 MG PO TABS: 750.0000 mg | ORAL_TABLET | ORAL | Status: DC

## 2015-01-31 MED ORDER — LEVOFLOXACIN 750 MG PO TABS
750.0000 mg | ORAL_TABLET | Freq: Once | ORAL | Status: AC
  Administered 2015-01-31: 750 mg via ORAL

## 2015-01-31 MED ORDER — LEVOFLOXACIN 500 MG PO TABS
ORAL_TABLET | ORAL | Status: AC
  Administered 2015-01-31: 750 mg via ORAL
  Filled 2015-01-31: qty 1

## 2015-01-31 NOTE — ED Notes (Signed)
Unable to obtain blood. Multiple attempts to insert IV.

## 2015-01-31 NOTE — Discharge Instructions (Signed)
As we discussed, we believe your weakness is caused by a couple of areas in your lungs which on the chest x-ray appeared to be pneumonia.  Your labs were generally reassuring today.  We offered you admission for IV antibiotics but we understand your preference to go home and try outpatient treatment.  As we discussed, though, it is VERY important that you return immediately to the Emergency Department if your symptoms worse or if you develop new symptoms that concern you.  Please follow up with your primary care doctor at the next available opportunity; call tomorrow for an appointment as soon as possible.   Pneumonia Pneumonia is an infection of the lungs.  CAUSES Pneumonia may be caused by bacteria or a virus. Usually, these infections are caused by breathing infectious particles into the lungs (respiratory tract). SIGNS AND SYMPTOMS   Cough.  Fever.  Chest pain.  Increased rate of breathing.  Wheezing.  Mucus production. DIAGNOSIS  If you have the common symptoms of pneumonia, your health care provider will typically confirm the diagnosis with a chest X-ray. The X-ray will show an abnormality in the lung (pulmonary infiltrate) if you have pneumonia. Other tests of your blood, urine, or sputum may be done to find the specific cause of your pneumonia. Your health care provider may also do tests (blood gases or pulse oximetry) to see how well your lungs are working. TREATMENT  Some forms of pneumonia may be spread to other people when you cough or sneeze. You may be asked to wear a mask before and during your exam. Pneumonia that is caused by bacteria is treated with antibiotic medicine. Pneumonia that is caused by the influenza virus may be treated with an antiviral medicine. Most other viral infections must run their course. These infections will not respond to antibiotics.  HOME CARE INSTRUCTIONS   Cough suppressants may be used if you are losing too much rest. However, coughing protects  you by clearing your lungs. You should avoid using cough suppressants if you can.  Your health care provider may have prescribed medicine if he or she thinks your pneumonia is caused by bacteria or influenza. Finish your medicine even if you start to feel better.  Your health care provider may also prescribe an expectorant. This loosens the mucus to be coughed up.  Take medicines only as directed by your health care provider.  Do not smoke. Smoking is a common cause of bronchitis and can contribute to pneumonia. If you are a smoker and continue to smoke, your cough may last several weeks after your pneumonia has cleared.  A cold steam vaporizer or humidifier in your room or home may help loosen mucus.  Coughing is often worse at night. Sleeping in a semi-upright position in a recliner or using a couple pillows under your head will help with this.  Get rest as you feel it is needed. Your body will usually let you know when you need to rest. PREVENTION A pneumococcal shot (vaccine) is available to prevent a common bacterial cause of pneumonia. This is usually suggested for:  People over 38 years old.  Patients on chemotherapy.  People with chronic lung problems, such as bronchitis or emphysema.  People with immune system problems. If you are over 65 or have a high risk condition, you may receive the pneumococcal vaccine if you have not received it before. In some countries, a routine influenza vaccine is also recommended. This vaccine can help prevent some cases of pneumonia.You may be  offered the influenza vaccine as part of your care. If you smoke, it is time to quit. You may receive instructions on how to stop smoking. Your health care provider can provide medicines and counseling to help you quit. SEEK MEDICAL CARE IF: You have a fever. SEEK IMMEDIATE MEDICAL CARE IF:   Your illness becomes worse. This is especially true if you are elderly or weakened from any other disease.  You  cannot control your cough with suppressants and are losing sleep.  You begin coughing up blood.  You develop pain which is getting worse or is uncontrolled with medicines.  Any of the symptoms which initially brought you in for treatment are getting worse rather than better.  You develop shortness of breath or chest pain. MAKE SURE YOU:   Understand these instructions.  Will watch your condition.  Will get help right away if you are not doing well or get worse. Document Released: 07/29/2005 Document Revised: 12/13/2013 Document Reviewed: 10/18/2010 [facility] Patient Information 2015 [place], [place]. This information is not intended to replace advice given to you by your health care provider. Make sure you discuss any questions you have with your health care provider.

## 2015-01-31 NOTE — ED Notes (Addendum)
Dr. Karma Greaser notified need for [place] IV

## 2015-01-31 NOTE — ED Notes (Signed)
Pt states weakness started yesterday. Denies any trouble speaking or swallowing.

## 2015-01-31 NOTE — ED Provider Notes (Signed)
[facility] Emergency Department Provider Note  ____________________________________________  Time seen: Approximately 4:38 PM  I have reviewed the triage vital signs and the nursing notes.   HISTORY  Chief Complaint Weakness    HPI Lauren Best is a 81 y.o. female With a history of diabetes, hypertension, CAD with a implanted defibrillator, and right-sided mastectomy who presents with generalized weakness that started today.  She and her husband state that she just feels generally weak and has difficulty getting around today but without any specific symptoms.  Of note, she has been seen recently in the emergency department after her some dizziness and a mechanical fall.  She was seen by her doctors at [facility] yesterday for a checkup regarding her defibrillator.  Her primary care doctor is Dr. Doy Mince.  She states that she has not felt this type of weakness before but a review of her records shows that his likely that she has had some generalized weakness issues in the past.  She denies chest pain, shortness of breath, nausea, vomiting, abdominal pain, dysuria.  She is not having any focal weakness in any specific extremity.  She describes the symptoms as moderate to severe.   Past Medical History  Diagnosis Date  . Diabetes mellitus without complication   . Hypertension   . Coronary artery disease   . Cancer     There are no active problems to display for this patient.   Past Surgical History  Procedure Laterality Date  . Cardiac defibrillator placement Left   . Mastectomy Right   . Joint replacement Right     knee    Current Outpatient Rx  Name  Route  Sig  Dispense  Refill  . aspirin 81 MG chewable tablet   Oral   Chew 81 mg by mouth daily.         Marland Kitchen atorvastatin (LIPITOR) 40 MG tablet   Oral   Take 40 mg by mouth daily.         . clopidogrel (PLAVIX) 75 MG tablet   Oral   Take 75 mg by mouth daily.         . insulin lispro  protamine-lispro (HUMALOG 75/25 MIX) (75-25) 100 UNIT/ML SUSP injection   Subcutaneous   Inject 40 Units into the skin 2 (two) times daily.         Marland Kitchen lisinopril (PRINIVIL,ZESTRIL) 5 MG tablet   Oral   Take 5 mg by mouth daily.         . metoprolol succinate (TOPROL-XL) 25 MG 24 hr tablet   Oral   Take 12.5 mg by mouth daily.         . Omega-3 Fatty Acids (FISH OIL) 1000 MG CAPS   Oral   Take 1 capsule by mouth daily.         . traMADol (ULTRAM) 50 MG tablet   Oral   Take 1 tablet (50 mg total) by mouth every 8 (eight) hours as needed. Patient taking differently: Take 50 mg by mouth every 8 (eight) hours as needed for moderate pain.    12 tablet   0   . vitamin B-12 (CYANOCOBALAMIN) 1000 MCG tablet   Oral   Take 1,000 mcg by mouth daily.                      Allergies Eliquis  No family history on file.  Social History History  Substance Use Topics  . Smoking status: Never Smoker   .  Smokeless tobacco: Not on file  . Alcohol Use: No    Review of Systems Constitutional: No fever/chills.  Generalized weakness Eyes: No visual changes. ENT: No sore throat. Cardiovascular: Denies chest pain. Respiratory: Denies shortness of breath. Gastrointestinal: No abdominal pain.  No nausea, no vomiting.  No diarrhea.  No constipation. Genitourinary: Negative for dysuria. Musculoskeletal: Negative for back pain. Skin: Negative for rash. Neurological: Negative for headaches, focal weakness or numbness.  10-point ROS otherwise negative.  ____________________________________________   PHYSICAL EXAM:  VITAL SIGNS: ED Triage Vitals  Enc Vitals Group     BP 01/31/15 1606 115/34 mmHg     Pulse Rate 01/31/15 1606 84     Resp 01/31/15 1606 16     Temp 01/31/15 1606 99.8 F (37.7 C)     Temp Source 01/31/15 1606 Oral     SpO2 01/31/15 1606 96 %     Weight 01/31/15 1606 163 lb (73.936 kg)     Height 01/31/15 1606 5\' 3"  (1.6 m)     Head Cir --      Peak Flow --       Pain Score 01/31/15 1607 0     Pain Loc --      Pain Edu? --      Excl. in [place]? --     Constitutional: Alert and oriented. Well appearing and in no acute distress. Eyes: Conjunctivae are normal. PERRL. EOMI. Head: Atraumatic. Nose: No congestion/rhinnorhea. Mouth/Throat: Mucous membranes are moist.  Oropharynx non-erythematous. Neck: No stridor.   Cardiovascular: Normal rate, regular rhythm. Grossly normal heart sounds.  Good peripheral circulation. Respiratory: Normal respiratory effort.  No retractions. Lungs CTAB. Gastrointestinal: Soft and nontender. No distention. No abdominal bruits. No CVA tenderness. Musculoskeletal: No lower extremity tenderness nor edema.  No joint effusions. Neurologic:  Normal speech and language. No gross focal neurologic deficits are appreciated. Speech is normal.  Muscle strength is preserved throughout all of her extremities. Skin:  Skin is warm, dry and intact. No rash noted. Psychiatric: Mood and affect are normal. Speech and behavior are normal.  ____________________________________________   LABS (all labs ordered are listed, but only abnormal results are displayed)  Labs Reviewed  COMPREHENSIVE METABOLIC PANEL - Abnormal; Notable for the following:    Sodium 131 (*)    Chloride 100 (*)    CO2 20 (*)    Glucose, Bld 150 (*)    BUN 25 (*)    Creatinine, Ser 1.27 (*)    Calcium 8.3 (*)    Albumin 2.7 (*)    AST 126 (*)    ALT 85 (*)    Alkaline Phosphatase 193 (*)    GFR calc non Af Amer 39 (*)    GFR calc Af Amer 45 (*)    All other components within normal limits  TROPONIN I - Abnormal; Notable for the following:    Troponin I 0.05 (*)    All other components within normal limits  MAGNESIUM   ____________________________________________  EKG  ED ECG REPORT I, Kristina Bertone, the attending physician, personally viewed and interpreted this ECG.   Date: 01/31/2015  EKG Time: 16:20  Rate: 79  Rhythm: normal sinus rhythm,  occasional PVC noted, unifocal  Axis: Normal  Intervals:Normal  ST&T Change: Non-specific ST segment / T-wave changes, but no evidence of acute ischemia.   ____________________________________________  RADIOLOGY  I personally viewed and evaluated these images as part of my medical decision making.   Dg Chest 2 View  01/31/2015  CLINICAL DATA:  Generalized weakness, cough  EXAM: CHEST  2 VIEW  COMPARISON:  08/24/2014  FINDINGS: Mild patchy opacity in the right upper lobe/perihilar region, some of which may reflect chronic scarring, although superimposed pneumonia is not excluded.  Mild right basilar opacity, atelectasis versus pneumonia.  The heart is top-normal in size.  Left subclavian ICD.  IMPRESSION: Patchy right upper and lower lobe opacities, pneumonia not excluded.   Electronically Signed   By: Julian Hy M.D.   On: 01/31/2015 19:24   ____________________________________________   PROCEDURES  Procedure(s) performed: None  Critical Care performed: No ____________________________________________   INITIAL IMPRESSION / ASSESSMENT AND PLAN / ED COURSE  Pertinent labs & imaging results that were available during my care of the patient were reviewed by me and considered in my medical decision making (see chart for details).  The patient is complaining only of generalized weakness without any specific other complaints.  I reviewed her lab work performed yesterday at [facility].  She had a CBC that was essentially normal with mild anemia.  Unfortunately she did not have a metabolic panel performed yesterday.  It is very difficult to obtain vascular access on this patient; 2 nurses and for medics of been unable to obtain access, I failed twice with ultrasound guidance, and another ED physician also failed twice using the ultrasound.  We will obtain a metabolic panel by asking respiratory therapy to obtain arterial blood.  She is in no acute distress but does have mild hypoxemia at rest  with an SPO2 ranging from 89% to 96%, and she also has mild tachypnea.  I will obtain a chest x-ray to further evaluate the possibility of infection.  ----------------------------------------- 9:09 PM on 01/31/2015 -----------------------------------------  The patient states she feels a little bit better.  I am still awaiting her lab results, but I discussed with her and her husband the results of her chest x-ray which suggested just pneumonia and 2 separate locations.  This may explain her mild tachypnea and her low O2 sat, but this continues to be variable between the upper 80s in the mid 90s with no appreciable change in the pleth on the monitor.  I offered admission for treatment of probable pneumonia but the patient feels strongly about going home.  I explained that she may get worse in spite of outpatient antibiotics and the patient and her husband understand and state that they will return immediately to the emergency department if she worsens or develops new symptoms that are concerning.  Stressed the importance of close outpatient follow-up with Dr. Doy Mince at the next available opportunity as well as giving them my usual and customary return precautions to the emergency department.  They understand and agree.  I am giving her a first dose of Levaquin, dosed for prior mild renal insufficiency, prior to her discharge. ____________________________________________  FINAL CLINICAL IMPRESSION(S) / ED DIAGNOSES  Final diagnoses:  Community acquired pneumonia      NEW MEDICATIONS STARTED DURING THIS VISIT:  Discharge Medication List as of 01/31/2015  9:33 PM    START taking these medications   Details  levofloxacin (LEVAQUIN) 750 MG tablet Take 1 tablet (750 mg total) by mouth every other day., Starting 01/31/2015, Until Discontinued, Print         Hinda Kehr, MD 01/31/15 2258

## 2015-03-04 ENCOUNTER — Encounter: Payer: Self-pay | Admitting: Emergency Medicine

## 2015-03-04 ENCOUNTER — Emergency Department
Admission: EM | Admit: 2015-03-04 | Discharge: 2015-03-04 | Disposition: A | Discharge status: Home / Self Care | Payer: Medicare Other | Attending: Emergency Medicine | Admitting: Emergency Medicine

## 2015-03-04 ENCOUNTER — Emergency Department: Payer: Medicare Other

## 2015-03-04 DIAGNOSIS — I10 Essential (primary) hypertension: Secondary | ICD-10-CM | POA: Diagnosis not present

## 2015-03-04 DIAGNOSIS — Z79899 Other long term (current) drug therapy: Secondary | ICD-10-CM | POA: Insufficient documentation

## 2015-03-04 DIAGNOSIS — J159 Unspecified bacterial pneumonia: Secondary | ICD-10-CM | POA: Insufficient documentation

## 2015-03-04 DIAGNOSIS — E119 Type 2 diabetes mellitus without complications: Secondary | ICD-10-CM | POA: Insufficient documentation

## 2015-03-04 DIAGNOSIS — Z7982 Long term (current) use of aspirin: Secondary | ICD-10-CM | POA: Diagnosis not present

## 2015-03-04 DIAGNOSIS — C799 Secondary malignant neoplasm of unspecified site: Secondary | ICD-10-CM

## 2015-03-04 DIAGNOSIS — C787 Secondary malignant neoplasm of liver and intrahepatic bile duct: Secondary | ICD-10-CM | POA: Diagnosis not present

## 2015-03-04 DIAGNOSIS — R05 Cough: Secondary | ICD-10-CM

## 2015-03-04 DIAGNOSIS — Z7902 Long term (current) use of antithrombotics/antiplatelets: Secondary | ICD-10-CM | POA: Insufficient documentation

## 2015-03-04 DIAGNOSIS — R0602 Shortness of breath: Secondary | ICD-10-CM | POA: Diagnosis present

## 2015-03-04 DIAGNOSIS — Z794 Long term (current) use of insulin: Secondary | ICD-10-CM | POA: Diagnosis not present

## 2015-03-04 DIAGNOSIS — J189 Pneumonia, unspecified organism: Secondary | ICD-10-CM

## 2015-03-04 DIAGNOSIS — R059 Cough, unspecified: Secondary | ICD-10-CM

## 2015-03-04 HISTORY — DX: Heart failure, unspecified: I50.9

## 2015-03-04 LAB — CBC
HCT: 31 % — ABNORMAL LOW (ref 35.0–47.0)
Hemoglobin: 10.1 g/dL — ABNORMAL LOW (ref 12.0–16.0)
MCH: 28.7 pg (ref 26.0–34.0)
MCHC: 32.4 g/dL (ref 32.0–36.0)
MCV: 88.6 fL (ref 80.0–100.0)
Platelets: 234 10*3/uL (ref 150–440)
RBC: 3.5 MIL/uL — ABNORMAL LOW (ref 3.80–5.20)
RDW: 16.4 % — AB (ref 11.5–14.5)
WBC: 9.6 10*3/uL (ref 3.6–11.0)

## 2015-03-04 LAB — BASIC METABOLIC PANEL
Anion gap: 9 (ref 5–15)
BUN: 23 mg/dL — AB (ref 6–20)
CHLORIDE: 95 mmol/L — AB (ref 101–111)
CO2: 27 mmol/L (ref 22–32)
Calcium: 8.3 mg/dL — ABNORMAL LOW (ref 8.9–10.3)
Creatinine, Ser: 1.34 mg/dL — ABNORMAL HIGH (ref 0.44–1.00)
GFR calc Af Amer: 42 mL/min — ABNORMAL LOW (ref >60)
GFR calc non Af Amer: 36 mL/min — ABNORMAL LOW (ref >60)
Glucose, Bld: 402 mg/dL — ABNORMAL HIGH (ref 65–99)
POTASSIUM: 5.8 mmol/L — AB (ref 3.5–5.1)
Sodium: 131 mmol/L — ABNORMAL LOW (ref 135–145)

## 2015-03-04 LAB — TROPONIN I: TROPONIN I: 0.03 ng/mL (ref <0.031)

## 2015-03-04 LAB — POTASSIUM: POTASSIUM: 4.6 mmol/L (ref 3.5–5.1)

## 2015-03-04 LAB — FIBRIN DERIVATIVES D-DIMER (ARMC ONLY): Fibrin derivatives D-dimer (ARMC): 4736 — ABNORMAL HIGH (ref 0–499)

## 2015-03-04 MED ORDER — IPRATROPIUM-ALBUTEROL 0.5-2.5 (3) MG/3ML IN SOLN
3.0000 mL | Freq: Once | RESPIRATORY_TRACT | Status: AC
  Administered 2015-03-04: 3 mL via RESPIRATORY_TRACT
  Filled 2015-03-04: qty 3

## 2015-03-04 MED ORDER — BENZONATATE 100 MG PO CAPS
100.0000 mg | ORAL_CAPSULE | Freq: Once | ORAL | Status: AC
  Administered 2015-03-04: 100 mg via ORAL
  Filled 2015-03-04: qty 1

## 2015-03-04 MED ORDER — LEVOFLOXACIN 750 MG PO TABS
750.0000 mg | ORAL_TABLET | Freq: Every day | ORAL | Status: DC
  Administered 2015-03-04: 750 mg via ORAL
  Filled 2015-03-04: qty 1

## 2015-03-04 MED ORDER — ALBUTEROL SULFATE HFA 108 (90 BASE) MCG/ACT IN AERS: 2.0000 | INHALATION_SPRAY | Freq: Four times a day (QID) | RESPIRATORY_TRACT | Status: AC | PRN

## 2015-03-04 MED ORDER — IOHEXOL 350 MG/ML SOLN
60.0000 mL | Freq: Once | INTRAVENOUS | Status: AC | PRN
  Administered 2015-03-04: 60 mL via INTRAVENOUS

## 2015-03-04 MED ORDER — LEVOFLOXACIN 750 MG PO TABS: 750.0000 mg | ORAL_TABLET | Freq: Every day | ORAL | Status: AC

## 2015-03-04 MED ORDER — BENZONATATE 100 MG PO CAPS: 100.0000 mg | ORAL_CAPSULE | Freq: Four times a day (QID) | ORAL | Status: DC | PRN

## 2015-03-04 NOTE — ED Notes (Signed)
Nurse attempted blood draw unsuccessful times 2

## 2015-03-04 NOTE — ED Notes (Addendum)
Pt reports cough for a couple of weeks ago first diagnosed with Pneumonia 2 weeks ago then PCP diagnosed pt with Bronchitis one week ago. Pt with dry cough. Pt states she has right side rib pain SOB and weakness

## 2015-03-04 NOTE — ED Provider Notes (Signed)
[facility] Emergency Department Provider Note  ____________________________________________  Time seen: 1740  I have reviewed the triage vital signs and the nursing notes.   HISTORY  Chief Complaint Shortness of Breath and Cough   History limited by: Not Limited   HPI Lauren Best is a 81 y.o. female who presents to the emergency department with concerns for continued cough and shortness of breath. The patient states that these symptoms started roughly1 month ago when she was diagnosed with a pneumonia. She saw her primary care doctor roughly 1 week ago who diagnosed her with bronchitis. She states that she has had persistent cough since the pneumonia. Her primary care doctor did try putting her on a Tessalon Perles which helped a little. Patient states that she has since developed pain in her right chest. This is worse with the cough. She denies any recent fevers. Denies any bloody phlegm.     Past Medical History  Diagnosis Date  . Diabetes mellitus without complication   . Hypertension   . Coronary artery disease   . Cancer     There are no active problems to display for this patient.   Past Surgical History  Procedure Laterality Date  . Cardiac defibrillator placement Left   . Mastectomy Right   . Joint replacement Right     knee    Current Outpatient Rx  Name  Route  Sig  Dispense  Refill  . aspirin 81 MG chewable tablet   Oral   Chew 81 mg by mouth daily.         Marland Kitchen atorvastatin (LIPITOR) 40 MG tablet   Oral   Take 40 mg by mouth daily.         . clopidogrel (PLAVIX) 75 MG tablet   Oral   Take 75 mg by mouth daily.         . insulin lispro protamine-lispro (HUMALOG 75/25 MIX) (75-25) 100 UNIT/ML SUSP injection   Subcutaneous   Inject 40 Units into the skin 2 (two) times daily.         Marland Kitchen levofloxacin (LEVAQUIN) 750 MG tablet   Oral   Take 1 tablet (750 mg total) by mouth every other day.   5 tablet   0   .  lisinopril (PRINIVIL,ZESTRIL) 5 MG tablet   Oral   Take 5 mg by mouth daily.         . metoprolol succinate (TOPROL-XL) 25 MG 24 hr tablet   Oral   Take 12.5 mg by mouth daily.         . Omega-3 Fatty Acids (FISH OIL) 1000 MG CAPS   Oral   Take 1 capsule by mouth daily.         . traMADol (ULTRAM) 50 MG tablet   Oral   Take 1 tablet (50 mg total) by mouth every 8 (eight) hours as needed. Patient taking differently: Take 50 mg by mouth every 8 (eight) hours as needed for moderate pain.    12 tablet   0   . vitamin B-12 (CYANOCOBALAMIN) 1000 MCG tablet   Oral   Take 1,000 mcg by mouth daily.           Allergies Codeine and Eliquis  No family history on file.  Social History History  Substance Use Topics  . Smoking status: Never Smoker   . Smokeless tobacco: Never Used  . Alcohol Use: No    Review of Systems  Constitutional: Negative for fever. Cardiovascular: Positive  for right sided chest pain. Respiratory: Positive for shortness breath and cough Gastrointestinal: Negative for abdominal pain, vomiting and diarrhea. Genitourinary: Negative for dysuria. Musculoskeletal: Negative for back pain. Skin: Negative for rash. Neurological: Negative for headaches, focal weakness or numbness.   10-point ROS otherwise negative.  ____________________________________________   PHYSICAL EXAM:  VITAL SIGNS: ED Triage Vitals  Enc Vitals Group     BP 03/04/15 1649 156/87 mmHg     Pulse Rate 03/04/15 1649 108     Resp 03/04/15 1649 18     Temp 03/04/15 1649 98.5 F (36.9 C)     Temp Source 03/04/15 1649 Oral     SpO2 03/04/15 1649 98 %     Weight 03/04/15 1649 163 lb (73.936 kg)     Height 03/04/15 1649 5\' 3"  (1.6 m)     Head Cir --      Peak Flow --      Pain Score 03/04/15 1653 9   Constitutional: Alert and oriented. Well appearing and in no distress. Occasional dry cough Eyes: Conjunctivae are normal. PERRL. Normal extraocular movements. ENT    Head: Normocephalic and atraumatic.   Nose: No congestion/rhinnorhea.   Mouth/Throat: Mucous membranes are moist.   Neck: No stridor. Hematological/Lymphatic/Immunilogical: No cervical lymphadenopathy. Cardiovascular: Tachycardic, regular rhythm.  No murmurs, rubs, or gallops. Respiratory: Normal respiratory effort, occasional dry cough, wheezing diffusely. Gastrointestinal: Soft and nontender. No distention. There is no CVA tenderness. Genitourinary: Deferred Musculoskeletal: Normal range of motion in all extremities. No joint effusions.  No lower extremity tenderness nor edema. Neurologic:  Normal speech and language. No gross focal neurologic deficits are appreciated. Speech is normal.  Skin:  Skin is warm, dry and intact. No rash noted. Psychiatric: Mood and affect are normal. Speech and behavior are normal. Patient exhibits appropriate insight and judgment.  ____________________________________________    LABS (pertinent positives/negatives)  Labs Reviewed  CBC - Abnormal; Notable for the following:    RBC 3.50 (*)    Hemoglobin 10.1 (*)    HCT 31.0 (*)    RDW 16.4 (*)    All other components within normal limits  BASIC METABOLIC PANEL - Abnormal; Notable for the following:    Sodium 131 (*)    Potassium 5.8 (*)    Chloride 95 (*)    Glucose, Bld 402 (*)    BUN 23 (*)    Creatinine, Ser 1.34 (*)    Calcium 8.3 (*)    GFR calc non Af Amer 36 (*)    GFR calc Af Amer 42 (*)    All other components within normal limits  FIBRIN DERIVATIVES D-DIMER ([facility] ONLY) - Abnormal; Notable for the following:    Fibrin derivatives D-dimer [facility]) 4736 (*)    All other components within normal limits  TROPONIN I  POTASSIUM     ____________________________________________   RADIOLOGY  CXR IMPRESSION: Scarring right mid lung region. No frank edema or consolidation. Mild generalized interstitial prominence, likely reflecting underlying chronic inflammatory type change.  No change in cardiac silhouette. No pneumothorax.  ____________________________________________   PROCEDURES  Procedure(s) performed: None  Critical Care performed: No  ____________________________________________   INITIAL IMPRESSION / ASSESSMENT AND PLAN / ED COURSE  Pertinent labs & imaging results that were available during my care of the patient were reviewed by me and considered in my medical decision making (see chart for details).  Patient presented to the emergency department today with continued cough and shortness of breath. On exam patient with an occasional cough.  She was given DuoNeb treatment with Good resolution of the cough. The course of the workup patient did have a d-dimer sig given tachycardia and symptoms. This was elevated so CTA was performed. CTA did show unfortunately metastatic disease. I discussed this finding with patient and family. Discussed importance of following up with her oncologist. Furthermore the CT scan did show a pneumonia. I will give patient antibiotics. Will also give patient albuterol inhaler given the relief that the DuoNeb gave.  ____________________________________________   FINAL CLINICAL IMPRESSION(S) / ED DIAGNOSES  Final diagnoses:  Community acquired pneumonia  Metastatic disease  Cough     Nance Pear, MD 03/04/15 2128

## 2015-03-04 NOTE — Discharge Instructions (Signed)
As we discussed it is important that he again in touch with her oncologist to discuss the findings of metastatic lesions on the CT scan. Please seek medical attention for any high fevers, chest pain, shortness of breath, change in behavior, persistent vomiting, bloody stool or any other new or concerning symptoms.  Metastatic Cancer, Questions and Answers KEY POINTS  Cancer happens when cells become abnormal and grow without control.  Where the cancer started is called the primary cancer or the primary tumor.  Metastatic cancer happens when cancer cells spread from the place where it started to other parts of the body.  When cancer spreads, the metastatic cancer keeps the same type of cells and the same name as the primary tumor.  The most common sites of metastasis are the lungs, bones, liver, and brain.  Treatment for metastatic cancer usually depends on the type of cancer. It also depends on the size and location of the metastasis. WHAT IS CANCER?   Cancer is a group of many related diseases. All cancers begin in cells. Cells are the building blocks that make up tissues. Cancer that arises from organs and solid tissues is called a solid tumor. Cancer that begins in blood cells is called leukemia, multiple myeloma, or lymphoma.  Normally, cells grow and divide to form new cells as the body needs them. When cells grow old and die, new cells take their place. Sometimes this orderly process goes wrong. New cells form when the body does not need them. Old cells do not die when they should.  The extra cells form a mass of tissue. This is called a growth or tumor. Tumors can be either not cancerous (benign) or cancerous (malignant). Benign tumors do not spread to other parts of the body. They are rarely a threat to life. Malignant tumors can spread (metastasize) and may be life threatening. WHAT IS PRIMARY CANCER?  Cancer can begin in any organ or tissue of the body. The original tumor is called  the primary cancer or primary tumor. It is usually named for the part of the body or the type of cell in which it begins. WHAT IS METASTASIS, AND HOW DOES IT HAPPEN?   Metastasis means the spread of cancer. Cancer cells can break away from a primary tumor and enter the bloodstream or lymphatic system. This is the system that produces, stores, and carries the cells that fight infections. That is how cancer cells spread to other parts of the body.  When cancer cells spread and form a new tumor in a different organ, the new tumor is a metastatic tumor. The cells in the metastatic tumor come from the original tumor. For example, if breast cancer spreads to the lungs, the metastatic tumor in the lung is made up of cancerous breast cells. It is not made of lung cells. In this case, the disease in the lungs is metastatic breast cancer (not lung cancer). Under a microscope, metastatic breast cancer cells generally look the same as the cancer cells in the breast. [place]?   Cancer cells can spread to almost any part of the body. Cancer cells frequently spread to lymph nodes (rounded masses of lymphatic tissue) near the primary tumor (regional lymph nodes). This is called lymph node involvement or regional disease. Cancer that spreads to other organs or to lymph nodes far from the primary tumor is called metastatic disease. Caregivers sometimes also call this distant disease.  The most common sites of metastasis from solid tumors  are the lungs, bones, liver, and brain. Some cancers tend to spread to certain parts of the body. For example, lung cancer often metastasizes to the brain or bones. Colon cancer often spreads to the liver. Prostate cancer tends to spread to the bones. Breast cancer commonly spreads to the bones, lungs, liver, or brain. But each of these cancers can spread to other parts of the body as well.  Because blood cells travel throughout the body, leukemia, multiple myeloma, and  lymphoma cells are usually not localized when the cancer is diagnosed. Tumor cells may be found in the blood, several lymph nodes, or other parts of the body such as the liver or bones. This type of spread is not referred to as metastasis. ARE THERE SYMPTOMS OF METASTATIC CANCER?   Some people with metastatic cancer do not have symptoms. Their metastases are found by X-rays and other tests performed for other reasons.  When symptoms of metastatic cancer occur, the type and frequency of the symptoms will depend on the size and location of the metastasis. For example, cancer that spreads to the bones is likely to cause pain and can lead to bone fractures. Cancer that spreads to the brain can cause a variety of symptoms. These include headaches, seizures, and unsteadiness. Shortness of breath may be a sign of lung involvement. Abdominal swelling or yellowing of the skin (jaundice) can indicate that cancer has spread to the liver.  Sometimes a person's primary cancer is discovered only after the metastatic tumor causes symptoms. For example, a man whose prostate cancer has spread to the bones in his pelvis may have lower back pain (caused by the cancer in his bones) before he experiences any symptoms from the primary tumor in his prostate. HOW DOES THE CAREGIVER KNOW WHETHER A CANCER IS PRIMARY OR A METASTATIC TUMOR?  To determine whether a tumor is primary or metastatic, the tumor will be examined under a microscope. In general, cancer cells look like abnormal versions of cells in the tissue where the cancer began. Using specialized diagnostic tests, a trained person is often able to tell where the cancer cells came from. Markers or antigens found in or on the cancer cells can indicate the primary site of the cancer.  Metastatic cancers may be found before or at the same time as the primary tumor, or months or years later. When a new tumor is found in a patient who has been treated for cancer in the past,  it is more often a metastasis than another primary tumor. IS IT POSSIBLE TO HAVE A METASTATIC TUMOR WITHOUT HAVING A PRIMARY CANCER?  No. A metastatic tumor always starts from cancer cells in another part of the body. In most cases, when a metastatic tumor is found first, the primary tumor can be found. The search for the primary tumor may involve lab tests, X-rays, and other procedures. However, in a small number of cases, a metastatic tumor is diagnosed but the primary tumor cannot be found, in spite of extensive tests. The tumor is metastatic because the cells are not like those in the organ or tissue in which the tumor is found. The primary tumor is called unknown or hidden (occult). The patient is said to have cancer of unknown primary origin (CUP). Because diagnostic techniques are constantly improving, the number of cases of CUP is going down.  WHAT TREATMENTS ARE USED FOR METASTATIC CANCER?   When cancer has metastasized, it may be treated with:  Chemotherapy.  Radiation therapy.  Biological therapy.  Hormone therapy.  Surgery.  Cryosurgery.  A combination of these.  The choice of treatment generally depends on the:  Type of primary cancer.  Size and location of the metastasis.  Patient's age and general health.  Types of treatments the patient has had in the past. In patients with CUP, it is possible to treat the disease even though the primary tumor has not been located. The goal of treatment may be to control the cancer, or to relieve symptoms or side effects of treatment. ARE NEW TREATMENTS FOR METASTATIC CANCER BEING DEVELOPED?  Yes, many new cancer treatments are under study. To develop new treatments, the [employer] sponsors clinical trials (research studies) with cancer patients in many hospitals, universities, medical schools, and cancer centers around the country. Clinical trials are a critical step in the improvement of treatment. Before any new treatment can be  recommended for general use, doctors conduct studies to find out whether the treatment is both safe for patients and effective against the disease. The results of such studies have led to progress not only in the treatment of cancer, but in the detection, diagnosis, and prevention of the disease as well. Patients interested in taking part in a clinical trial should talk with their caregivers. [place] ([place]): www.cancer.gov Document Released: 12/03/2004 Document Revised: 10/21/2011 Document Reviewed: 07/21/2008 [facility] Patient Information 2015 [place], [place]. This information is not intended to replace advice given to you by your health care provider. Make sure you discuss any questions you have with your health care provider.  Pneumonia Pneumonia is an infection of the lungs.  CAUSES Pneumonia may be caused by bacteria or a virus. Usually, these infections are caused by breathing infectious particles into the lungs (respiratory tract). SIGNS AND SYMPTOMS   Cough.  Fever.  Chest pain.  Increased rate of breathing.  Wheezing.  Mucus production. DIAGNOSIS  If you have the common symptoms of pneumonia, your health care provider will typically confirm the diagnosis with a chest X-ray. The X-ray will show an abnormality in the lung (pulmonary infiltrate) if you have pneumonia. Other tests of your blood, urine, or sputum may be done to find the specific cause of your pneumonia. Your health care provider may also do tests (blood gases or pulse oximetry) to see how well your lungs are working. TREATMENT  Some forms of pneumonia may be spread to other people when you cough or sneeze. You may be asked to wear a mask before and during your exam. Pneumonia that is caused by bacteria is treated with antibiotic medicine. Pneumonia that is caused by the influenza virus may be treated with an antiviral medicine. Most other viral infections must run their course. These  infections will not respond to antibiotics.  HOME CARE INSTRUCTIONS   Cough suppressants may be used if you are losing too much rest. However, coughing protects you by clearing your lungs. You should avoid using cough suppressants if you can.  Your health care provider may have prescribed medicine if he or she thinks your pneumonia is caused by bacteria or influenza. Finish your medicine even if you start to feel better.  Your health care provider may also prescribe an expectorant. This loosens the mucus to be coughed up.  Take medicines only as directed by your health care provider.  Do not smoke. Smoking is a common cause of bronchitis and can contribute to pneumonia. If you are a smoker and continue to smoke, your cough may last several  weeks after your pneumonia has cleared.  A cold steam vaporizer or humidifier in your room or home may help loosen mucus.  Coughing is often worse at night. Sleeping in a semi-upright position in a recliner or using a couple pillows under your head will help with this.  Get rest as you feel it is needed. Your body will usually let you know when you need to rest. PREVENTION A pneumococcal shot (vaccine) is available to prevent a common bacterial cause of pneumonia. This is usually suggested for:  People over 25 years old.  Patients on chemotherapy.  People with chronic lung problems, such as bronchitis or emphysema.  People with immune system problems. If you are over 65 or have a high risk condition, you may receive the pneumococcal vaccine if you have not received it before. In some countries, a routine influenza vaccine is also recommended. This vaccine can help prevent some cases of pneumonia.You may be offered the influenza vaccine as part of your care. If you smoke, it is time to quit. You may receive instructions on how to stop smoking. Your health care provider can provide medicines and counseling to help you quit. SEEK MEDICAL CARE IF: You  have a fever. SEEK IMMEDIATE MEDICAL CARE IF:   Your illness becomes worse. This is especially true if you are elderly or weakened from any other disease.  You cannot control your cough with suppressants and are losing sleep.  You begin coughing up blood.  You develop pain which is getting worse or is uncontrolled with medicines.  Any of the symptoms which initially brought you in for treatment are getting worse rather than better.  You develop shortness of breath or chest pain. MAKE SURE YOU:   Understand these instructions.  Will watch your condition.  Will get help right away if you are not doing well or get worse. Document Released: 07/29/2005 Document Revised: 12/13/2013 Document Reviewed: 10/18/2010 [facility] Patient Information 2015 [place], [place]. This information is not intended to replace advice given to you by your health care provider. Make sure you discuss any questions you have with your health care provider.

## 2015-03-15 ENCOUNTER — Inpatient Hospital Stay
Admission: EM | Admit: 2015-03-15 | Discharge: 2015-03-21 | DRG: 281 | Disposition: A | Discharge status: Home / Self Care | Payer: Medicare Other | Attending: Internal Medicine | Admitting: Internal Medicine

## 2015-03-15 ENCOUNTER — Encounter: Payer: Self-pay | Admitting: Emergency Medicine

## 2015-03-15 ENCOUNTER — Emergency Department: Payer: Medicare Other

## 2015-03-15 DIAGNOSIS — R160 Hepatomegaly, not elsewhere classified: Secondary | ICD-10-CM | POA: Diagnosis present

## 2015-03-15 DIAGNOSIS — K769 Liver disease, unspecified: Secondary | ICD-10-CM

## 2015-03-15 DIAGNOSIS — Z888 Allergy status to other drugs, medicaments and biological substances status: Secondary | ICD-10-CM | POA: Diagnosis not present

## 2015-03-15 DIAGNOSIS — I129 Hypertensive chronic kidney disease with stage 1 through stage 4 chronic kidney disease, or unspecified chronic kidney disease: Secondary | ICD-10-CM | POA: Diagnosis present

## 2015-03-15 DIAGNOSIS — T380X5A Adverse effect of glucocorticoids and synthetic analogues, initial encounter: Secondary | ICD-10-CM | POA: Diagnosis present

## 2015-03-15 DIAGNOSIS — N183 Chronic kidney disease, stage 3 (moderate): Secondary | ICD-10-CM | POA: Diagnosis present

## 2015-03-15 DIAGNOSIS — E1122 Type 2 diabetes mellitus with diabetic chronic kidney disease: Secondary | ICD-10-CM | POA: Diagnosis present

## 2015-03-15 DIAGNOSIS — E860 Dehydration: Secondary | ICD-10-CM | POA: Diagnosis present

## 2015-03-15 DIAGNOSIS — C787 Secondary malignant neoplasm of liver and intrahepatic bile duct: Secondary | ICD-10-CM | POA: Diagnosis present

## 2015-03-15 DIAGNOSIS — R05 Cough: Secondary | ICD-10-CM | POA: Diagnosis present

## 2015-03-15 DIAGNOSIS — I5032 Chronic diastolic (congestive) heart failure: Secondary | ICD-10-CM | POA: Diagnosis present

## 2015-03-15 DIAGNOSIS — Z7982 Long term (current) use of aspirin: Secondary | ICD-10-CM

## 2015-03-15 DIAGNOSIS — E119 Type 2 diabetes mellitus without complications: Secondary | ICD-10-CM

## 2015-03-15 DIAGNOSIS — I2510 Atherosclerotic heart disease of native coronary artery without angina pectoris: Secondary | ICD-10-CM | POA: Diagnosis present

## 2015-03-15 DIAGNOSIS — C7800 Secondary malignant neoplasm of unspecified lung: Secondary | ICD-10-CM | POA: Diagnosis present

## 2015-03-15 DIAGNOSIS — Z95810 Presence of automatic (implantable) cardiac defibrillator: Secondary | ICD-10-CM | POA: Diagnosis not present

## 2015-03-15 DIAGNOSIS — R059 Cough, unspecified: Secondary | ICD-10-CM

## 2015-03-15 DIAGNOSIS — Z794 Long term (current) use of insulin: Secondary | ICD-10-CM | POA: Diagnosis not present

## 2015-03-15 DIAGNOSIS — C50911 Malignant neoplasm of unspecified site of right female breast: Secondary | ICD-10-CM | POA: Diagnosis not present

## 2015-03-15 DIAGNOSIS — C779 Secondary and unspecified malignant neoplasm of lymph node, unspecified: Secondary | ICD-10-CM | POA: Diagnosis present

## 2015-03-15 DIAGNOSIS — R0602 Shortness of breath: Secondary | ICD-10-CM

## 2015-03-15 DIAGNOSIS — N179 Acute kidney failure, unspecified: Secondary | ICD-10-CM | POA: Diagnosis present

## 2015-03-15 DIAGNOSIS — I10 Essential (primary) hypertension: Secondary | ICD-10-CM | POA: Diagnosis present

## 2015-03-15 DIAGNOSIS — E1165 Type 2 diabetes mellitus with hyperglycemia: Secondary | ICD-10-CM | POA: Diagnosis present

## 2015-03-15 DIAGNOSIS — R599 Enlarged lymph nodes, unspecified: Secondary | ICD-10-CM | POA: Diagnosis not present

## 2015-03-15 DIAGNOSIS — J209 Acute bronchitis, unspecified: Secondary | ICD-10-CM | POA: Diagnosis present

## 2015-03-15 DIAGNOSIS — Z885 Allergy status to narcotic agent status: Secondary | ICD-10-CM

## 2015-03-15 DIAGNOSIS — R6510 Systemic inflammatory response syndrome (SIRS) of non-infectious origin without acute organ dysfunction: Secondary | ICD-10-CM

## 2015-03-15 DIAGNOSIS — C50919 Malignant neoplasm of unspecified site of unspecified female breast: Secondary | ICD-10-CM | POA: Diagnosis present

## 2015-03-15 DIAGNOSIS — Z79811 Long term (current) use of aromatase inhibitors: Secondary | ICD-10-CM | POA: Diagnosis not present

## 2015-03-15 DIAGNOSIS — I214 Non-ST elevation (NSTEMI) myocardial infarction: Principal | ICD-10-CM

## 2015-03-15 HISTORY — DX: Presence of automatic (implantable) cardiac defibrillator: Z95.810

## 2015-03-15 LAB — URINALYSIS COMPLETE WITH MICROSCOPIC (ARMC ONLY)
BILIRUBIN URINE: NEGATIVE
Ketones, ur: NEGATIVE mg/dL
Nitrite: NEGATIVE
PH: 5 (ref 5.0–8.0)
Protein, ur: 30 mg/dL — AB
SPECIFIC GRAVITY, URINE: 1.035 — AB (ref 1.005–1.030)

## 2015-03-15 LAB — CBC WITH DIFFERENTIAL/PLATELET
Basophils Absolute: 0 10*3/uL (ref 0–0.1)
Basophils Relative: 0 %
EOS ABS: 0 10*3/uL (ref 0–0.7)
EOS PCT: 0 %
HCT: 29.2 % — ABNORMAL LOW (ref 35.0–47.0)
Hemoglobin: 9.4 g/dL — ABNORMAL LOW (ref 12.0–16.0)
Lymphocytes Relative: 5 %
Lymphs Abs: 0.6 10*3/uL — ABNORMAL LOW (ref 1.0–3.6)
MCH: 28.5 pg (ref 26.0–34.0)
MCHC: 32.1 g/dL (ref 32.0–36.0)
MCV: 88.8 fL (ref 80.0–100.0)
MONO ABS: 1.2 10*3/uL — AB (ref 0.2–0.9)
MONOS PCT: 8 %
NEUTROS ABS: 12.5 10*3/uL — AB (ref 1.4–6.5)
NEUTROS PCT: 87 %
PLATELETS: 180 10*3/uL (ref 150–440)
RBC: 3.29 MIL/uL — AB (ref 3.80–5.20)
RDW: 17.6 % — ABNORMAL HIGH (ref 11.5–14.5)
WBC: 14.3 10*3/uL — ABNORMAL HIGH (ref 3.6–11.0)

## 2015-03-15 LAB — BASIC METABOLIC PANEL
ANION GAP: 12 (ref 5–15)
BUN: 26 mg/dL — ABNORMAL HIGH (ref 6–20)
CALCIUM: 8.1 mg/dL — AB (ref 8.9–10.3)
CHLORIDE: 94 mmol/L — AB (ref 101–111)
CO2: 22 mmol/L (ref 22–32)
CREATININE: 1.52 mg/dL — AB (ref 0.44–1.00)
GFR calc non Af Amer: 31 mL/min — ABNORMAL LOW (ref >60)
GFR, EST AFRICAN AMERICAN: 36 mL/min — AB (ref >60)
Glucose, Bld: 495 mg/dL — ABNORMAL HIGH (ref 65–99)
Potassium: 4.3 mmol/L (ref 3.5–5.1)
Sodium: 128 mmol/L — ABNORMAL LOW (ref 135–145)

## 2015-03-15 LAB — GLUCOSE, CAPILLARY: Glucose-Capillary: 389 mg/dL — ABNORMAL HIGH (ref 65–99)

## 2015-03-15 LAB — TROPONIN I
TROPONIN I: 0.04 ng/mL — AB (ref <0.031)
TROPONIN I: 0.04 ng/mL — AB (ref <0.031)

## 2015-03-15 MED ORDER — IPRATROPIUM-ALBUTEROL 0.5-2.5 (3) MG/3ML IN SOLN
3.0000 mL | RESPIRATORY_TRACT | Status: DC
  Administered 2015-03-15 – 2015-03-16 (×7): 3 mL via RESPIRATORY_TRACT
  Filled 2015-03-15 (×7): qty 3

## 2015-03-15 MED ORDER — CLOPIDOGREL BISULFATE 75 MG PO TABS
75.0000 mg | ORAL_TABLET | Freq: Every day | ORAL | Status: DC
  Administered 2015-03-16 – 2015-03-19 (×4): 75 mg via ORAL
  Filled 2015-03-15 (×4): qty 1

## 2015-03-15 MED ORDER — METHYLPREDNISOLONE SODIUM SUCC 125 MG IJ SOLR
60.0000 mg | Freq: Four times a day (QID) | INTRAMUSCULAR | Status: DC
  Administered 2015-03-15 – 2015-03-17 (×6): 60 mg via INTRAVENOUS
  Filled 2015-03-15 (×6): qty 2

## 2015-03-15 MED ORDER — INSULIN ASPART 100 UNIT/ML ~~LOC~~ SOLN
0.0000 [IU] | Freq: Three times a day (TID) | SUBCUTANEOUS | Status: DC
  Administered 2015-03-15 – 2015-03-16 (×2): 9 [IU] via SUBCUTANEOUS
  Administered 2015-03-16 (×2): 10 [IU] via SUBCUTANEOUS
  Filled 2015-03-15: qty 10
  Filled 2015-03-15: qty 9
  Filled 2015-03-15: qty 1
  Filled 2015-03-15: qty 9
  Filled 2015-03-15: qty 10

## 2015-03-15 MED ORDER — VITAMIN B-12 1000 MCG PO TABS
1000.0000 ug | ORAL_TABLET | Freq: Every day | ORAL | Status: DC
  Administered 2015-03-16 – 2015-03-21 (×6): 1000 ug via ORAL
  Filled 2015-03-15 (×6): qty 1

## 2015-03-15 MED ORDER — VITAMIN D (ERGOCALCIFEROL) 1.25 MG (50000 UNIT) PO CAPS
50000.0000 [IU] | ORAL_CAPSULE | ORAL | Status: DC
  Administered 2015-03-20: 11:00:00 50000 [IU] via ORAL
  Filled 2015-03-15: qty 1

## 2015-03-15 MED ORDER — INSULIN ASPART 100 UNIT/ML ~~LOC~~ SOLN: 0.0000 [IU] | Freq: Three times a day (TID) | SUBCUTANEOUS | Status: DC

## 2015-03-15 MED ORDER — HEPARIN SODIUM (PORCINE) 5000 UNIT/ML IJ SOLN
5000.0000 [IU] | Freq: Three times a day (TID) | INTRAMUSCULAR | Status: DC
  Administered 2015-03-15 – 2015-03-21 (×17): 5000 [IU] via SUBCUTANEOUS
  Filled 2015-03-15 (×17): qty 1

## 2015-03-15 MED ORDER — VANCOMYCIN HCL IN DEXTROSE 1-5 GM/200ML-% IV SOLN
1000.0000 mg | Freq: Once | INTRAVENOUS | Status: AC
  Administered 2015-03-15: 1000 mg via INTRAVENOUS
  Filled 2015-03-15: qty 200

## 2015-03-15 MED ORDER — INSULIN ASPART PROT & ASPART (70-30 MIX) 100 UNIT/ML ~~LOC~~ SUSP
30.0000 [IU] | Freq: Two times a day (BID) | SUBCUTANEOUS | Status: DC
  Administered 2015-03-16: 30 [IU] via SUBCUTANEOUS
  Filled 2015-03-15: qty 30

## 2015-03-15 MED ORDER — VANCOMYCIN HCL IN DEXTROSE 750-5 MG/150ML-% IV SOLN
750.0000 mg | INTRAVENOUS | Status: DC
  Administered 2015-03-16: 12:00:00 750 mg via INTRAVENOUS
  Filled 2015-03-15 (×2): qty 150

## 2015-03-15 MED ORDER — BENZONATATE 100 MG PO CAPS
100.0000 mg | ORAL_CAPSULE | Freq: Four times a day (QID) | ORAL | Status: DC | PRN
Start: 2015-03-15 — End: 2015-03-21
  Administered 2015-03-17 – 2015-03-18 (×2): 100 mg via ORAL
  Filled 2015-03-15 (×2): qty 1

## 2015-03-15 MED ORDER — FUROSEMIDE 20 MG PO TABS
20.0000 mg | ORAL_TABLET | Freq: Every day | ORAL | Status: DC
  Administered 2015-03-16: 20 mg via ORAL
  Filled 2015-03-15: qty 1

## 2015-03-15 MED ORDER — LEVOFLOXACIN IN D5W 750 MG/150ML IV SOLN
750.0000 mg | Freq: Once | INTRAVENOUS | Status: AC
  Administered 2015-03-15: 750 mg via INTRAVENOUS
  Filled 2015-03-15: qty 150

## 2015-03-15 MED ORDER — ATORVASTATIN CALCIUM 20 MG PO TABS
40.0000 mg | ORAL_TABLET | Freq: Every day | ORAL | Status: DC
  Administered 2015-03-16 – 2015-03-20 (×5): 40 mg via ORAL
  Filled 2015-03-15 (×6): qty 2

## 2015-03-15 MED ORDER — IOHEXOL 350 MG/ML SOLN
75.0000 mL | Freq: Once | INTRAVENOUS | Status: AC | PRN
  Administered 2015-03-15: 75 mL via INTRAVENOUS

## 2015-03-15 MED ORDER — METOPROLOL SUCCINATE ER 25 MG PO TB24
12.5000 mg | ORAL_TABLET | Freq: Every day | ORAL | Status: DC
  Administered 2015-03-16 – 2015-03-17 (×2): 12.5 mg via ORAL
  Administered 2015-03-18: 09:00:00 25 mg via ORAL
  Administered 2015-03-19 – 2015-03-21 (×3): 12.5 mg via ORAL
  Filled 2015-03-15 (×6): qty 1

## 2015-03-15 MED ORDER — ASPIRIN 81 MG PO CHEW
324.0000 mg | CHEWABLE_TABLET | Freq: Once | ORAL | Status: AC
  Administered 2015-03-15: 324 mg via ORAL
  Filled 2015-03-15: qty 4

## 2015-03-15 MED ORDER — TRAMADOL HCL 50 MG PO TABS
50.0000 mg | ORAL_TABLET | Freq: Four times a day (QID) | ORAL | Status: DC | PRN
Start: 2015-03-15 — End: 2015-03-21

## 2015-03-15 MED ORDER — PIPERACILLIN SOD-TAZOBACTAM SO 3.375 (3-0.375) G IV SOLR
INTRAVENOUS | Status: AC
  Filled 2015-03-15: qty 3.38

## 2015-03-15 MED ORDER — SODIUM CHLORIDE 0.9 % IV BOLUS (SEPSIS)
1000.0000 mL | Freq: Once | INTRAVENOUS | Status: AC
  Administered 2015-03-15: 1000 mL via INTRAVENOUS

## 2015-03-15 MED ORDER — PIPERACILLIN-TAZOBACTAM 3.375 G IVPB
3.3750 g | Freq: Once | INTRAVENOUS | Status: AC
  Administered 2015-03-15: 3.375 g via INTRAVENOUS

## 2015-03-15 MED ORDER — PIPERACILLIN-TAZOBACTAM 4.5 G IVPB
4.5000 g | Freq: Three times a day (TID) | INTRAVENOUS | Status: DC
  Administered 2015-03-16: 4.5 g via INTRAVENOUS
  Filled 2015-03-15 (×5): qty 100

## 2015-03-15 NOTE — H&P (Signed)
[facility] [place] NAME: Lauren Best    MR#:  [id number]  DATE OF BIRTH:  Oct 23, 1932  DATE OF ADMISSION:  03/15/2015  PRIMARY CARE PHYSICIAN: Dion Body, MD   REQUESTING/REFERRING PHYSICIAN: Darrick Penna  CHIEF COMPLAINT:   Chief Complaint  Patient presents with  . Cough    HISTORY OF PRESENT ILLNESS: Lauren Best  is a 81 y.o. female with a known history of diabetes, hypertension, coronary artery disease, breast cancer, CHF, AICD she had complain of feeling short of breath so came to emergency room 10 days ago when onset CT scan she was found to have nodularities on the lung in in the liver, she was given antibiotics and sent home with advice to follow with her primary care physician. She had seen Dr. Jacquenette Shone her primary care after that, he thought it was bronchitis. And for the findings of nodules in the lung daily refer her to Dr. Ma Hillock in [place]. She continued to feel short of breath and have dry cough, without any fever or chest pain, and gradually getting more and more weak so decided to come to emergency room today.  On repeat CT scan, there are no findings of infiltrate.  PAST MEDICAL HISTORY:   Past Medical History  Diagnosis Date  . Diabetes mellitus without complication   . Hypertension   . Coronary artery disease   . Cancer   . CHF (congestive heart failure)   . AICD (automatic cardioverter/defibrillator) present     PAST SURGICAL HISTORY:  Past Surgical History  Procedure Laterality Date  . Cardiac defibrillator placement Left   . Mastectomy Right   . Joint replacement Right     knee    SOCIAL HISTORY:  History  Substance Use Topics  . Smoking status: Never Smoker   . Smokeless tobacco: Never Used  . Alcohol Use: No    FAMILY HISTORY:  Family History  Problem Relation Age of Onset  . Alzheimer's disease Mother   . Congestive Heart Failure Father     DRUG ALLERGIES:  Allergies   Allergen Reactions  . Codeine Rash  . Eliquis [Apixaban] Rash and Other (See Comments)    Reaction:  Bleeding     REVIEW OF SYSTEMS:   CONSTITUTIONAL: No fever, positive for fatigue or weakness.  EYES: No blurred or double vision.  EARS, NOSE, AND THROAT: No tinnitus or ear pain.  RESPIRATORY: excessive cough and shortness of breath, no wheezing or hemoptysis. No sputum production. CARDIOVASCULAR: No chest pain, orthopnea, edema.  GASTROINTESTINAL: No nausea, vomiting, diarrhea or abdominal pain.  GENITOURINARY: No dysuria, hematuria.  ENDOCRINE: No polyuria, nocturia,  HEMATOLOGY: No anemia, easy bruising or bleeding SKIN: No rash or lesion. MUSCULOSKELETAL: No joint pain or arthritis.   NEUROLOGIC: No tingling, numbness, weakness.  PSYCHIATRY: No anxiety or depression.   MEDICATIONS AT HOME:  Prior to Admission medications   Medication Sig Start Date End Date Taking? Authorizing Provider  albuterol (PROVENTIL HFA;VENTOLIN HFA) 108 (90 BASE) MCG/ACT inhaler Inhale 2 puffs into the lungs every 6 (six) hours as needed for wheezing or shortness of breath. 03/04/15  Yes Nance Pear, MD  atorvastatin (LIPITOR) 40 MG tablet Take 40 mg by mouth daily.   Yes Historical Provider, MD  benzonatate (TESSALON PERLES) 100 MG capsule Take 1 capsule (100 mg total) by mouth every 6 (six) hours as needed for cough. 03/04/15 03/03/16 Yes Nance Pear, MD  clopidogrel (PLAVIX) 75 MG tablet Take 75 mg  by mouth daily.   Yes Historical Provider, MD  furosemide (LASIX) 20 MG tablet Take 20 mg by mouth daily.   Yes Historical Provider, MD  insulin lispro protamine-lispro (HUMALOG 75/25 MIX) (75-25) 100 UNIT/ML SUSP injection Inject 40 Units into the skin 2 (two) times daily.   Yes Historical Provider, MD  levofloxacin (LEVAQUIN) 750 MG tablet Take 750 mg by mouth daily.   Yes Historical Provider, MD  lisinopril (PRINIVIL,ZESTRIL) 5 MG tablet Take 5 mg by mouth daily.   Yes Historical Provider, MD   metoprolol succinate (TOPROL-XL) 25 MG 24 hr tablet Take 12.5 mg by mouth daily.   Yes Historical Provider, MD  Omega-3 Fatty Acids (FISH OIL) 1000 MG CAPS Take 1 capsule by mouth daily.   Yes Historical Provider, MD  vitamin B-12 (CYANOCOBALAMIN) 1000 MCG tablet Take 1,000 mcg by mouth daily.   Yes Historical Provider, MD  Vitamin D, Ergocalciferol, (DRISDOL) 50000 UNITS CAPS capsule Take 50,000 Units by mouth every 7 (seven) days. Pt takes on Monday.   Yes Historical Provider, MD  traMADol (ULTRAM) 50 MG tablet Take 1 tablet (50 mg total) by mouth every 8 (eight) hours as needed. Patient not taking: Reported on 03/15/2015 01/09/15   Victorino Dike, FNP      PHYSICAL EXAMINATION:   VITAL SIGNS: Blood pressure 108/50, pulse 81, temperature 98.5 F (36.9 C), temperature source Oral, resp. rate 16, height 5\' 3"  (1.6 m), weight 72.576 kg (160 lb), SpO2 98 %.  GENERAL:  81 y.o.-year-old patient lying in the bed with no acute distress.  EYES: Pupils equal, round, reactive to light and accommodation. No scleral icterus. Extraocular muscles intact.  HEENT: Head atraumatic, normocephalic. Oropharynx and nasopharynx clear.  NECK:  Supple, no jugular venous distention. No thyroid enlargement, no tenderness.  LUNGS: Normal breath sounds bilaterally, Mild wheezing, no crepitation. No use of accessory muscles of respiration.  CARDIOVASCULAR: S1, S2 normal. No murmurs, rubs, or gallops. AICD inplace. ABDOMEN: Soft, nontender, nondistended. Bowel sounds present. No organomegaly or mass.  EXTREMITIES: No pedal edema, cyanosis, or clubbing.  NEUROLOGIC: Cranial nerves II through XII are intact. Muscle strength 5/5 in all extremities. Sensation intact. Gait not checked.  PSYCHIATRIC: The patient is alert and oriented x 3.  SKIN: No obvious rash, lesion, or ulcer.   LABORATORY PANEL:   CBC  Recent Labs Lab 03/15/15 1620  WBC 14.3*  HGB 9.4*  HCT 29.2*  PLT 180  MCV 88.8  MCH 28.5  MCHC 32.1  RDW  17.6*  LYMPHSABS 0.6*  MONOABS 1.2*  EOSABS 0.0  BASOSABS 0.0   ------------------------------------------------------------------------------------------------------------------  Chemistries   Recent Labs Lab 03/15/15 1620  NA 128*  K 4.3  CL 94*  CO2 22  GLUCOSE 495*  BUN 26*  CREATININE 1.52*  CALCIUM 8.1*   ------------------------------------------------------------------------------------------------------------------ estimated creatinine clearance is 28.2 mL/min (by C-G formula based on Cr of 1.52). ------------------------------------------------------------------------------------------------------------------ No results for input(s): TSH, T4TOTAL, T3FREE, THYROIDAB in the last 72 hours.  Invalid input(s): FREET3   Coagulation profile No results for input(s): INR, PROTIME in the last 168 hours. ------------------------------------------------------------------------------------------------------------------- No results for input(s): DDIMER in the last 72 hours. -------------------------------------------------------------------------------------------------------------------  Cardiac Enzymes  Recent Labs Lab 03/15/15 1620  TROPONINI 0.04*    RADIOLOGY: Dg Chest 2 View  03/15/2015   CLINICAL DATA:  Cough, recent pneumonia.  EXAM: CHEST  2 VIEW  COMPARISON:  03/04/2015 and 01/31/2015  FINDINGS: Left-sided pacemaker unchanged. Lungs are hypoinflated with subtle linear density over the right midlung likely scarring.  No focal consolidation or effusion. Stable prominence of the central pulmonary interstitium. Stable mild cardiomegaly. Calcified plaque over the aortic arch. Mild degenerative change of the spine.  IMPRESSION: Hypoinflation without acute cardiopulmonary disease.  Mild stable prominence of the central pulmonary interstitium. Linear scarring right midlung.   Electronically Signed   By: Marin Olp M.D.   On: 03/15/2015 15:25   Ct Angio Chest Pe W/cm  &/or Wo Cm  03/15/2015   CLINICAL DATA:  Short of breath. Malignancy. Patient evaluated 03/04/2015 and diagnosed with bronchitis. Cough productive of sputum.  EXAM: CT ANGIOGRAPHY CHEST WITH CONTRAST  TECHNIQUE: Multidetector CT imaging of the chest was performed using the standard protocol during bolus administration of intravenous contrast. Multiplanar CT image reconstructions and MIPs were obtained to evaluate the vascular anatomy.  CONTRAST:  60mL OMNIPAQUE IOHEXOL 350 MG/ML SOLN  COMPARISON:  03/15/2015.  03/04/2015.  FINDINGS: Bones: Thoracic spine DISH. Osteopenia. No compression fracture of the thoracic spine.  Cardiovascular: Unchanged single lead LEFT subclavian cardiac pacemaker. No acute aortic abnormality. Negative for pulmonary embolism. Respiratory motion artifact mildly degrades the evaluation.  Lungs: Again, respiratory motion artifact is present. Radiation fibrosis is present in the RIGHT upper lobe along the subpleural surface anteriorly. Partially calcified nodule is present in the LEFT suprahilar lung. Multiple other smaller pulmonary nodules are present, some of which have a peribronchovascular distribution. Pulmonary nodules appears similar to prior.  Central airways: Patent.  Effusions: Small chronic dependently layering RIGHT pleural effusion. No LEFT pleural effusion. Compressive/relaxation atelectasis associated with the effusions.  There is studding of the pulmonary fissures bilaterally suggesting and lymphangitic spread of tumor.  Lymphadenopathy: No axillary adenopathy. Mediastinal adenopathy is present. 1 centimeter pretracheal node is present. Borderline and enlarged prevascular and AP window nodes are present. RIGHT hilar adenopathy is present extending to the RIGHT infrahilar region. The hilar adenopathy appears similar to the prior exam.  No pericardial effusion is identified however there are nodules along the anterior pericardium compatible with metastatic disease. One of these  is lateral to the RIGHT atrium and the other is anterior to the pulmonic valve.  Esophagus: Small hiatal hernia.  Upper abdomen: Infiltrative low-density lesions are present in both hepatic lobes, most compatible with metastatic disease. No change from prior. No acute upper abdominal abnormality.  Other: RIGHT mastectomy.  Review of the MIP images confirms the above findings.  IMPRESSION: 1. Negative for pulmonary embolus or acute aortic abnormality. 2. Atherosclerosis and coronary artery disease. 3. RIGHT mastectomy with radiation fibrosis in the anterior RIGHT upper lobe. 4. Mediastinal and hilar adenopathy with pleural nodularity, most compatible with lymphangitic spread of tumor. Infiltration of the liver compatible with metastatic disease.   Electronically Signed   By: Dereck Ligas M.D.   On: 03/15/2015 18:24    IMPRESSION AND PLAN:  * Acute bronchitis  We will give vancomycin and Zosyn, as she already finished Levaquin as outpatient.  Give IV steroids for now and oxygen supplementation, nebulizer .  Incentive spirometry.  * Nodules in the lung and liver  This is recently diagnosed 10 days ago and she was referred to oncologist as outpatient,  I will call oncology consult as inpatient to help with this, as she will miss her outpatient appointment which is after 2 days.  * History of coronary artery disease, CHF, AICD  Currently stable continue home medications.  * Diabetes  Continue insulin 70/ 30, and will give sliding scale coverage.   All the records are reviewed and case discussed with  ED provider. Management plans discussed with the patient, family and they are in agreement.  CODE STATUS: Full    TOTAL TIME TAKING CARE OF THIS PATIENT:  50 minutes.    Vaughan Basta M.D on 03/15/2015   Between 7am to 6pm - Pager - [phone]  After 6pm go to [email or web address] - password [email or web address] [facility]  Office  [phone]  CC: Primary care physician;  Dion Body, MD

## 2015-03-15 NOTE — ED Provider Notes (Signed)
-----------------------------------------   5:50 PM on 03/15/2015 -----------------------------------------  I was asked by Dr. Edd Fabian to place an IV for planned CT.   Angiocath insertion Performed by: Nance Pear  Consent: Verbal consent obtained. Risks and benefits: risks, benefits and alternatives were discussed Time out: Immediately prior to procedure a "time out" was called to verify the correct patient, procedure, equipment, support staff and site/side marked as required.  Preparation: Patient was prepped and draped in the usual sterile fashion.  Vein Location: left AC fossa  It Was Ultrasound Guided  Gauge: 20  Normal blood return and flush without difficulty Patient tolerance: Patient tolerated the procedure well with no immediate complications.    Nance Pear, MD 03/15/15 [id number]

## 2015-03-15 NOTE — Progress Notes (Signed)
ANTIBIOTIC CONSULT NOTE - INITIAL  Pharmacy Consult for Vancomycin/Zosyn  Indication: pneumonia  Allergies  Allergen Reactions  . Codeine Rash  . Eliquis [Apixaban] Rash and Other (See Comments)    Reaction:  Bleeding     Patient Measurements: Height: 5\' 3"  (160 cm) Weight: 160 lb (72.576 kg) IBW/kg (Calculated) : 52.4 Adjusted Body Weight: 60.5 kg   Vital Signs: Temp: 98.5 F (36.9 C) (08/03 1455) Temp Source: Oral (08/03 1455) BP: 108/50 mmHg (08/03 1915) Pulse Rate: 81 (08/03 1915) Intake/Output from previous day:   Intake/Output from this shift: Total I/O In: -  Out: 300 [Urine:300]  Labs:  Recent Labs  03/15/15 1620  WBC 14.3*  HGB 9.4*  PLT 180  CREATININE 1.52*   Estimated Creatinine Clearance: 28.2 mL/min (by C-G formula based on Cr of 1.52). No results for input(s): VANCOTROUGH, VANCOPEAK, VANCORANDOM, GENTTROUGH, GENTPEAK, GENTRANDOM, TOBRATROUGH, TOBRAPEAK, TOBRARND, AMIKACINPEAK, AMIKACINTROU, AMIKACIN in the last 72 hours.   Microbiology: No results found for this or any previous visit (from the past 720 hour(s)).  Medical History: Past Medical History  Diagnosis Date  . Diabetes mellitus without complication   . Hypertension   . Coronary artery disease   . Cancer   . CHF (congestive heart failure)   . AICD (automatic cardioverter/defibrillator) present     Medications:   (Not in a hospital admission) Assessment: Pseudomonas risk factors: prior use of levaquin  CrCl = 28.2 ml/min Ke = 0.03 hr-1 T1/2 = 23.1 hrs Vd = 42.4 L  Goal of Therapy:  Vancomycin trough level 15-20 mcg/ml  Plan:  Expected duration 7 days with resolution of temperature and/or normalization of WBC   Zosyn 3.375 gm IV X 1 given in ED on 8/3 @ 19:00. Zosyn 4.5 gm IV Q8H EI ordered to start 8/4 @ 3:00.   Vancomycin 1 gm IV X 1 given on 8/3 @ 19:21. Vancomycin 750 mg IV Q24H ordered to start 8/4 @ 11:00, ~ 16 hrs after 1st dose. This pt will reach [facility] by 8/8  @ 19:00. Will draw 1st trough on 8/7 @ 10:30, which will not be at [facility].   Jester Klingberg D 03/15/2015,9:14 PM

## 2015-03-15 NOTE — ED Notes (Signed)
Pt assisted to void with use of bedpan; urine output measured and documented but UA specimen not obtained d/t urine being contaminated with a very small amount of stool.

## 2015-03-15 NOTE — ED Notes (Signed)
Patient transported to X-ray 

## 2015-03-15 NOTE — ED Provider Notes (Signed)
[facility] Emergency Department Provider Note  ____________________________________________  Time seen: Approximately 3:30 PM  I have reviewed the triage vital signs and the nursing notes.   HISTORY  Chief Complaint Cough    HPI Lauren Best is a 81 y.o. female with history of coronary artery disease, diabetes, hypertension, CHF with AICD, metastatic cancer who presents for evaluation of greater than one month of gradual onset constant cough with mild shortness of breath. Lauren Best was treated for pneumonia last month with "an antibiotic". Lauren Best returned to this emergency department on 03/04/2015 for continued cough. Lauren Best had a CTA of her chest at that time which showed diffuse/widely metastatic cancer as well as a right-sided pneumonia. Lauren Best was again discharged with antibiotics. Lauren Best reports her cough and shortness of breath have not improved. No chest pain. No fevers. Lauren Best occasionally has posttussive emesis which is nonbloody, nonbilious. No diarrhea. Lauren Best has been fatigued with poor energy, poor appetite. Current severity of symptoms is moderate.   Past Medical History  Diagnosis Date  . Diabetes mellitus without complication   . Hypertension   . Coronary artery disease   . Cancer   . CHF (congestive heart failure)   . AICD (automatic cardioverter/defibrillator) present     There are no active problems to display for this Lauren Best.   Past Surgical History  Procedure Laterality Date  . Cardiac defibrillator placement Left   . Mastectomy Right   . Joint replacement Right     knee    Current Outpatient Rx  Name  Route  Sig  Dispense  Refill  . albuterol (PROVENTIL HFA;VENTOLIN HFA) 108 (90 BASE) MCG/ACT inhaler   Inhalation   Inhale 2 puffs into the lungs every 6 (six) hours as needed for wheezing or shortness of breath.   1 Inhaler   0   . aspirin 81 MG chewable tablet   Oral   Chew 81 mg by mouth daily.         Marland Kitchen atorvastatin (LIPITOR) 40 MG  tablet   Oral   Take 40 mg by mouth daily.         . benzonatate (TESSALON PERLES) 100 MG capsule   Oral   Take 1 capsule (100 mg total) by mouth every 6 (six) hours as needed for cough.   30 capsule   0   . clopidogrel (PLAVIX) 75 MG tablet   Oral   Take 75 mg by mouth daily.         . insulin lispro protamine-lispro (HUMALOG 75/25 MIX) (75-25) 100 UNIT/ML SUSP injection   Subcutaneous   Inject 40 Units into the skin 2 (two) times daily.         Marland Kitchen lisinopril (PRINIVIL,ZESTRIL) 5 MG tablet   Oral   Take 5 mg by mouth daily.         . metoprolol succinate (TOPROL-XL) 25 MG 24 hr tablet   Oral   Take 12.5 mg by mouth daily.         . Omega-3 Fatty Acids (FISH OIL) 1000 MG CAPS   Oral   Take 1 capsule by mouth daily.         . traMADol (ULTRAM) 50 MG tablet   Oral   Take 1 tablet (50 mg total) by mouth every 8 (eight) hours as needed. Lauren Best taking differently: Take 50 mg by mouth every 8 (eight) hours as needed for moderate pain.    12 tablet   0   . vitamin B-12 (CYANOCOBALAMIN)  1000 MCG tablet   Oral   Take 1,000 mcg by mouth daily.           Allergies Codeine and Eliquis  No family history on file.  Social History History  Substance Use Topics  . Smoking status: Never Smoker   . Smokeless tobacco: Never Used  . Alcohol Use: No    Review of Systems Constitutional: No fever/chills Eyes: No visual changes. ENT: No sore throat. Cardiovascular: Denies chest pain. Respiratory: + shortness of breath. Gastrointestinal: No abdominal pain.  No nausea, +posttussive emesis.  No diarrhea.  No constipation. Genitourinary: Negative for dysuria. Musculoskeletal: Negative for back pain. Skin: Negative for rash. Neurological: Negative for headaches, focal weakness or numbness.  10-point ROS otherwise negative.  ____________________________________________   PHYSICAL EXAM:  VITAL SIGNS: ED Triage Vitals  Enc Vitals Group     BP 03/15/15 1455  114/48 mmHg     Pulse Rate 03/15/15 1455 109     Resp 03/15/15 1455 18     Temp 03/15/15 1455 98.5 F (36.9 C)     Temp Source 03/15/15 1455 Oral     SpO2 03/15/15 1455 96 %     Weight 03/15/15 1455 160 lb (72.576 kg)     Height 03/15/15 1455 5\' 3"  (1.6 m)     Head Cir --      Peak Flow --      Pain Score 03/15/15 1456 8     Pain Loc --      Pain Edu? --      Excl. in [place]? --     Constitutional: Alert and oriented. Fatigued but nontoxic-appearing and in no acute distress Eyes: Conjunctivae are normal. PERRL. EOMI. Head: Atraumatic. Nose: No congestion/rhinnorhea. Mouth/Throat: Mucous membranes are moist.  Oropharynx non-erythematous. Neck: No stridor.   Cardiovascular: mildly tachycardic rate, regular rhythm. Grossly normal heart sounds.  Good peripheral circulation. Respiratory: Normal respiratory effort.  No retractions. Lungs CTAB. Gastrointestinal: Soft and nontender. No distention. No abdominal bruits. No CVA tenderness. Genitourinary: deferred Musculoskeletal: No lower extremity tenderness nor edema.  No joint effusions. Neurologic:  Normal speech and language. No gross focal neurologic deficits are appreciated. No gait instability. Skin:  Skin is warm, dry and intact. No rash noted. Psychiatric: Mood and affect are normal. Speech and behavior are normal.  ____________________________________________   LABS (all labs ordered are listed, but only abnormal results are displayed)  Labs Reviewed  CBC WITH DIFFERENTIAL/PLATELET - Abnormal; Notable for the following:    WBC 14.3 (*)    RBC 3.29 (*)    Hemoglobin 9.4 (*)    HCT 29.2 (*)    RDW 17.6 (*)    Neutro Abs 12.5 (*)    Lymphs Abs 0.6 (*)    Monocytes Absolute 1.2 (*)    All other components within normal limits  BASIC METABOLIC PANEL - Abnormal; Notable for the following:    Sodium 128 (*)    Chloride 94 (*)    Glucose, Bld 495 (*)    BUN 26 (*)    Creatinine, Ser 1.52 (*)    Calcium 8.1 (*)    GFR calc  non Af Amer 31 (*)    GFR calc Af Amer 36 (*)    All other components within normal limits  TROPONIN I - Abnormal; Notable for the following:    Troponin I 0.04 (*)    All other components within normal limits  CULTURE, BLOOD (ROUTINE X 2)  CULTURE, BLOOD (ROUTINE X 2)  URINALYSIS COMPLETEWITH MICROSCOPIC ([place])    ED ECG REPORT I, Joanne Gavel, the attending physician, personally viewed and interpreted this ECG.   Date: 03/15/2015  EKG Time: 16:25  Rate: 90  Rhythm: sinus rhythm with PVCs  Axis: left  Intervals:none  ST&T Change: No acute ST segment elevation. T-wave inversion in V2, V3, Q waves in anterior septal leads.   EKG similar to EKG on 03/04/2015 ____________________________________________  RADIOLOGY  CXR FINDINGS: Left-sided pacemaker unchanged. Lungs are hypoinflated with subtle linear density over the right midlung likely scarring. No focal consolidation or effusion. Stable prominence of the central pulmonary interstitium. Stable mild cardiomegaly. Calcified plaque over the aortic arch. Mild degenerative change of the spine.  IMPRESSION: Hypoinflation without acute cardiopulmonary disease.  Mild stable prominence of the central pulmonary interstitium. Linear scarring right midlung.FINDINGS: Left-sided pacemaker unchanged. Lungs are hypoinflated with subtle linear density over the right midlung likely scarring. No focal consolidation or effusion. Stable prominence of the central pulmonary interstitium. Stable mild cardiomegaly. Calcified plaque over the aortic arch. Mild degenerative change of the spine.  IMPRESSION: Hypoinflation without acute cardiopulmonary disease.  Mild stable prominence of the central pulmonary interstitium. Linear scarring right midlung.   CT angio chest IMPRESSION: 1. Negative for pulmonary embolus or acute aortic abnormality. 2. Atherosclerosis and coronary artery disease. 3. RIGHT mastectomy with radiation  fibrosis in the anterior RIGHT upper lobe. 4. Mediastinal and hilar adenopathy with pleural nodularity, most compatible with lymphangitic spread of tumor. Infiltration of the liver compatible with metastatic disease.  ____________________________________________   PROCEDURES  Procedure(s) performed: None  Critical Care performed: Yes, see critical care note(s). Total critical care time spent 30 minutes.  ____________________________________________   INITIAL IMPRESSION / ASSESSMENT AND PLAN / ED COURSE  Pertinent labs & imaging results that were available during my care of the Lauren Best were reviewed by me and considered in my medical decision making (see chart for details).  KASHLYN SALINAS is a 81 y.o. female with history of coronary artery disease, diabetes, hypertension, CHF with AICD, metastatic cancer who presents for evaluation of greater than one month of gradual onset constant cough with mild shortness of breath. On exam, Lauren Best appears fatigued but nontoxic appearing. Intermittently Lauren Best is mildly tachypnea. Lauren Best is not hypoxic. Lauren Best has frequent intermittent cough. Plan for labs, we'll pursue CTA chest to evaluate for pulmonary embolism or persistent, possibly postobstructive pneumonia related to malignancy that would require admission, prolonged IV antibiotics.  ----------------------------------------- 6:46 PM on 03/15/2015 ----------------------------------------- CTA chest negative for PE or continued pneumonia. Continue findings compatible with metastatic disease. Labs notable for leukocytosis, mild hyponatremia. Lauren Best received IV fluids, IV vancomycin, Zosyn, Levaquin on arrival. Awaiting urinalysis. We'll not give full 30 mL/kg bolus of normal saline due to known compromised ejection fraction. Lauren Best is needing 3 out of 4 Sirs criteria for tachycardia, tachypnea, leukocytosis. Troponin mildly elevated and given history of coronary artery disease this may present an STEMI versus  demand ischemia in the setting of tachycardia. Case discussed with hospitalist for admission at this time.  ____________________________________________   FINAL CLINICAL IMPRESSION(S) / ED DIAGNOSES  Final diagnoses:  SOB (shortness of breath)  Cough  SIRS (systemic inflammatory response syndrome)  NSTEMI (non-ST elevated myocardial infarction)      Joanne Gavel, MD 03/15/15 [phone]

## 2015-03-15 NOTE — ED Notes (Signed)
Pt reports having cough for a couple of weeks.  Pt was seen here in the ER a week and a half ago and was diagnosed with pneumonia and bronchitis and sent home on oral antibiotics.  Pt back today with no improvement and continuing to cough with white, thick sputum.  Pt's husband reports pt has no appetite and is weak.

## 2015-03-16 LAB — BASIC METABOLIC PANEL
ANION GAP: 11 (ref 5–15)
BUN: 28 mg/dL — AB (ref 6–20)
CHLORIDE: 97 mmol/L — AB (ref 101–111)
CO2: 22 mmol/L (ref 22–32)
CREATININE: 1.41 mg/dL — AB (ref 0.44–1.00)
Calcium: 8.1 mg/dL — ABNORMAL LOW (ref 8.9–10.3)
GFR calc non Af Amer: 34 mL/min — ABNORMAL LOW (ref >60)
GFR, EST AFRICAN AMERICAN: 40 mL/min — AB (ref >60)
GLUCOSE: 419 mg/dL — AB (ref 65–99)
POTASSIUM: 3.9 mmol/L (ref 3.5–5.1)
Sodium: 130 mmol/L — ABNORMAL LOW (ref 135–145)

## 2015-03-16 LAB — GLUCOSE, CAPILLARY
GLUCOSE-CAPILLARY: 513 mg/dL — AB (ref 65–99)
GLUCOSE-CAPILLARY: 518 mg/dL — AB (ref 65–99)
GLUCOSE-CAPILLARY: 549 mg/dL — AB (ref 65–99)
Glucose-Capillary: 435 mg/dL — ABNORMAL HIGH (ref 65–99)
Glucose-Capillary: 463 mg/dL — ABNORMAL HIGH (ref 65–99)
Glucose-Capillary: 545 mg/dL — ABNORMAL HIGH (ref 65–99)
Glucose-Capillary: 518 mg/dL — ABNORMAL HIGH (ref 65–99)
Glucose-Capillary: 490 mg/dL — ABNORMAL HIGH (ref 65–99)

## 2015-03-16 LAB — HEMOGLOBIN A1C: HEMOGLOBIN A1C: 9.2 % — AB (ref 4.0–6.0)

## 2015-03-16 LAB — CBC
HCT: 30 % — ABNORMAL LOW (ref 35.0–47.0)
Hemoglobin: 9.8 g/dL — ABNORMAL LOW (ref 12.0–16.0)
MCH: 28.9 pg (ref 26.0–34.0)
MCHC: 32.6 g/dL (ref 32.0–36.0)
MCV: 88.4 fL (ref 80.0–100.0)
Platelets: 183 10*3/uL (ref 150–440)
RBC: 3.4 MIL/uL — ABNORMAL LOW (ref 3.80–5.20)
RDW: 17.6 % — ABNORMAL HIGH (ref 11.5–14.5)
WBC: 12.5 10*3/uL — ABNORMAL HIGH (ref 3.6–11.0)

## 2015-03-16 MED ORDER — INSULIN ASPART 100 UNIT/ML ~~LOC~~ SOLN
16.0000 [IU] | Freq: Once | SUBCUTANEOUS | Status: AC
  Administered 2015-03-16: 16 [IU] via SUBCUTANEOUS
  Filled 2015-03-16: qty 16

## 2015-03-16 MED ORDER — INSULIN ASPART 100 UNIT/ML ~~LOC~~ SOLN: 15.0000 [IU] | Freq: Once | SUBCUTANEOUS | Status: DC

## 2015-03-16 MED ORDER — PIPERACILLIN-TAZOBACTAM 3.375 G IVPB
3.3750 g | Freq: Three times a day (TID) | INTRAVENOUS | Status: DC
  Filled 2015-03-16 (×4): qty 50

## 2015-03-16 MED ORDER — INSULIN ASPART PROT & ASPART (70-30 MIX) 100 UNIT/ML ~~LOC~~ SUSP
40.0000 [IU] | Freq: Two times a day (BID) | SUBCUTANEOUS | Status: DC
  Administered 2015-03-16 – 2015-03-20 (×8): 40 [IU] via SUBCUTANEOUS
  Filled 2015-03-16 (×8): qty 40

## 2015-03-16 MED ORDER — DEXTROSE 5 % IV SOLN
1.0000 g | INTRAVENOUS | Status: DC
  Administered 2015-03-16 – 2015-03-19 (×4): 1 g via INTRAVENOUS
  Filled 2015-03-16 (×8): qty 10

## 2015-03-16 MED ORDER — INSULIN ASPART 100 UNIT/ML ~~LOC~~ SOLN: 0.0000 [IU] | Freq: Three times a day (TID) | SUBCUTANEOUS | Status: DC

## 2015-03-16 MED ORDER — INSULIN ASPART 100 UNIT/ML ~~LOC~~ SOLN
0.0000 [IU] | Freq: Three times a day (TID) | SUBCUTANEOUS | Status: DC
  Administered 2015-03-16: 23:00:00 15 [IU] via SUBCUTANEOUS
  Filled 2015-03-16: qty 15

## 2015-03-16 MED ORDER — IPRATROPIUM-ALBUTEROL 0.5-2.5 (3) MG/3ML IN SOLN
3.0000 mL | Freq: Four times a day (QID) | RESPIRATORY_TRACT | Status: DC
  Administered 2015-03-17: 08:00:00 3 mL via RESPIRATORY_TRACT
  Filled 2015-03-16 (×2): qty 3

## 2015-03-16 MED ORDER — PIPERACILLIN-TAZOBACTAM 3.375 G IVPB
3.3750 g | Freq: Three times a day (TID) | INTRAVENOUS | Status: DC
  Administered 2015-03-16: 3.375 g via INTRAVENOUS
  Filled 2015-03-16 (×4): qty 50

## 2015-03-16 NOTE — Progress Notes (Signed)
Pt's CBG in the upper 500's.  Dr. Volanda Napoleon contacted.  Pt's scheduled insulin and SSI given.  Will recheck in 1 hour.  Clarise Cruz, RN

## 2015-03-16 NOTE — Progress Notes (Signed)
[facility] [place] NAME: Lauren Best    MR#:  [id number]  DATE OF BIRTH:  04-15-1933  SUBJECTIVE:  CHIEF COMPLAINT:   Chief Complaint  Patient presents with  . Cough   Continues to be dyspneic at rest with dry cough. No new complaints. Seems fairly anxious.  REVIEW OF SYSTEMS:   Review of Systems  Constitutional: Negative for fever.  Respiratory: Negative for shortness of breath.   Cardiovascular: Negative for chest pain and palpitations.  Gastrointestinal: Negative for nausea, vomiting and abdominal pain.  Genitourinary: Negative for dysuria.  Psychiatric/Behavioral: The patient is nervous/anxious.     DRUG ALLERGIES:   Allergies  Allergen Reactions  . Codeine Rash  . Eliquis [Apixaban] Rash and Other (See Comments)    Reaction:  Bleeding     VITALS:  Blood pressure 108/48, pulse 97, temperature 98 F (36.7 C), temperature source Oral, resp. rate 20, height 5\' 3"  (1.6 m), weight 71.668 kg (158 lb), SpO2 97 %.  PHYSICAL EXAMINATION:  GENERAL:  81 y.o.-year-old patient lying in the bed, uncomfortable and short of breath LUNGS: Short shallow respirations, fair air movement, diffuse fine wheezes CARDIOVASCULAR: S1, S2 normal. No murmurs, rubs, or gallops.  ABDOMEN: Soft, nontender, nondistended. Bowel sounds present. No organomegaly or mass.  EXTREMITIES: No pedal edema, cyanosis, or clubbing.  NEUROLOGIC: Cranial nerves II through XII are intact. Muscle strength 5/5 in all extremities. Sensation intact. Gait not checked.  PSYCHIATRIC: The patient is alert and oriented x 3. Anxious SKIN: No obvious rash, lesion, or ulcer.    LABORATORY PANEL:   CBC  Recent Labs Lab 03/16/15 0545  WBC 12.5*  HGB 9.8*  HCT 30.0*  PLT 183   ------------------------------------------------------------------------------------------------------------------  Chemistries   Recent Labs Lab 03/16/15 0545  NA 130*  K 3.9   CL 97*  CO2 22  GLUCOSE 419*  BUN 28*  CREATININE 1.41*  CALCIUM 8.1*   ------------------------------------------------------------------------------------------------------------------  Cardiac Enzymes  Recent Labs Lab 03/15/15 2131  TROPONINI 0.04*   ------------------------------------------------------------------------------------------------------------------  RADIOLOGY:  Dg Chest 2 View  03/15/2015   CLINICAL DATA:  Cough, recent pneumonia.  EXAM: CHEST  2 VIEW  COMPARISON:  03/04/2015 and 01/31/2015  FINDINGS: Left-sided pacemaker unchanged. Lungs are hypoinflated with subtle linear density over the right midlung likely scarring. No focal consolidation or effusion. Stable prominence of the central pulmonary interstitium. Stable mild cardiomegaly. Calcified plaque over the aortic arch. Mild degenerative change of the spine.  IMPRESSION: Hypoinflation without acute cardiopulmonary disease.  Mild stable prominence of the central pulmonary interstitium. Linear scarring right midlung.   Electronically Signed   By: Marin Olp M.D.   On: 03/15/2015 15:25   Ct Angio Chest Pe W/cm &/or Wo Cm  03/15/2015   CLINICAL DATA:  Short of breath. Malignancy. Patient evaluated 03/04/2015 and diagnosed with bronchitis. Cough productive of sputum.  EXAM: CT ANGIOGRAPHY CHEST WITH CONTRAST  TECHNIQUE: Multidetector CT imaging of the chest was performed using the standard protocol during bolus administration of intravenous contrast. Multiplanar CT image reconstructions and MIPs were obtained to evaluate the vascular anatomy.  CONTRAST:  12mL OMNIPAQUE IOHEXOL 350 MG/ML SOLN  COMPARISON:  03/15/2015.  03/04/2015.  FINDINGS: Bones: Thoracic spine DISH. Osteopenia. No compression fracture of the thoracic spine.  Cardiovascular: Unchanged single lead LEFT subclavian cardiac pacemaker. No acute aortic abnormality. Negative for pulmonary embolism. Respiratory motion artifact mildly degrades the evaluation.   Lungs: Again, respiratory motion artifact is present. Radiation fibrosis is present  in the RIGHT upper lobe along the subpleural surface anteriorly. Partially calcified nodule is present in the LEFT suprahilar lung. Multiple other smaller pulmonary nodules are present, some of which have a peribronchovascular distribution. Pulmonary nodules appears similar to prior.  Central airways: Patent.  Effusions: Small chronic dependently layering RIGHT pleural effusion. No LEFT pleural effusion. Compressive/relaxation atelectasis associated with the effusions.  There is studding of the pulmonary fissures bilaterally suggesting and lymphangitic spread of tumor.  Lymphadenopathy: No axillary adenopathy. Mediastinal adenopathy is present. 1 centimeter pretracheal node is present. Borderline and enlarged prevascular and AP window nodes are present. RIGHT hilar adenopathy is present extending to the RIGHT infrahilar region. The hilar adenopathy appears similar to the prior exam.  No pericardial effusion is identified however there are nodules along the anterior pericardium compatible with metastatic disease. One of these is lateral to the RIGHT atrium and the other is anterior to the pulmonic valve.  Esophagus: Small hiatal hernia.  Upper abdomen: Infiltrative low-density lesions are present in both hepatic lobes, most compatible with metastatic disease. No change from prior. No acute upper abdominal abnormality.  Other: RIGHT mastectomy.  Review of the MIP images confirms the above findings.  IMPRESSION: 1. Negative for pulmonary embolus or acute aortic abnormality. 2. Atherosclerosis and coronary artery disease. 3. RIGHT mastectomy with radiation fibrosis in the anterior RIGHT upper lobe. 4. Mediastinal and hilar adenopathy with pleural nodularity, most compatible with lymphangitic spread of tumor. Infiltration of the liver compatible with metastatic disease.   Electronically Signed   By: Dereck Ligas M.D.   On: 03/15/2015  18:24    EKG:   Orders placed or performed during the hospital encounter of 03/15/15  . ED EKG  . ED EKG  . EKG 12-Lead  . EKG 12-Lead  . EKG    ASSESSMENT AND PLAN:   #1 acute bronchitis/reactive airway: Shortness of breath, no hypoxia. - Possibly due to lymphangitic spread of cancer versus reactive airway - Continue Solu-Medrol, nebulizers, simplify anti-biotics to Rocephin (he did complete a full course of Levaquin as an outpatient)  #2 nodules in the lung and liver - Oncology to see today, likely recurrence of breast cancer  #3 diabetes mellitus type 2 uncontrolled - Check A1c, resume home dose 70/30+ sliding scale  #4 coronary artery disease, congestive heart failure/AICD in place - Ejection fraction is 30% by echo February 2016 - No chest pain or EKG change, troponins have been stable this is not ACS - No edema or other sign of congestive heart failure exacerbation at this time - Continue home regimen including aspirin, statin, Plavix, Lasix - Continue with strict I's and O's low-salt diet  CODE STATUS: Full   TOTAL TIME TAKING CARE OF THIS PATIENT: 30 minutes.  Greater than 50% of time spent in care coordination and counseling. Spoke with patient and nursing. POSSIBLE D/C IN 1-2, DEPENDING ON CLINICAL CONDITION.   Myrtis Ser M.D on 03/16/2015 at 2:31 PM  Between 7am to 6pm - Pager - [phone]  After 6pm go to [email or web address] - password [email or web address] [facility]  Office  [phone]  CC: Primary care physician; Dion Body, MD

## 2015-03-16 NOTE — Consult Note (Signed)
She was seen by me.  Her CT scan was reviewed.  However patient was seen by Dr.  Ma Hillock  for management of carcinoma of breasts Roel Cluck see if Dr.pandit would like to consult on this patient tomorrow Would recommend CT scan of abdomen to further define the liver lesion Either biopsy of liver lesion versus bronchoscopy would be recommended

## 2015-03-16 NOTE — Progress Notes (Signed)
Repeat FSBS remains 549/490. TC to Dr. Jannifer Franklin with FSBS's reviewed; insulin orders, current meds including solumedrol. Pt denies co's. MD to change sliding scale protocol and I am to give highest sliding scale insulin dose.

## 2015-03-16 NOTE — Plan of Care (Signed)
Problem: Discharge Progression Outcomes Goal: Complications resolved/controlled Outcome: Progressing Pt remains on 02, will wean as tolerated IV abt for acute bronchitis given with solumedrol Pt therapy possibly needed Awaiting for oncology to see pt

## 2015-03-16 NOTE — Plan of Care (Signed)
Problem: Discharge Progression Outcomes Goal: Complications resolved/controlled Outcome: Progressing Pt admitted from the ED last night after failing outpatient treatment. Recent findings of nodules in the lung and liver. Oncology cx pending. Pt has a history of right breast cancer with mastectomy. Periods of dyspnea on exertion with nonproductive cough- admitted with medical dx of acute bronchitis- she is currently on Solu-medrol. A dose of Levaquin and svn tx was administered prior to admission. Now on scheduled doses of Zosyn and Vancomycin. 02 sats are wnl on 2 liters. Pt doesn't wear oxygen at home.  Pt denies pain. Pt reports that she has a poor appetite. Malnutrition screen was 2. Dietary consult placed per protocol. Pt reports that she needs assistance with ADL's. Spouse of great help in the home. UA was positive for UTI. MD notified. No new orders. MD said that Vancomycin and Zosyn should cover.

## 2015-03-16 NOTE — Progress Notes (Signed)
Inpatient Diabetes Program Recommendations  AACE/ADA: New Consensus Statement on Inpatient Glycemic Control (2013)  Target Ranges:  Prepandial:   less than 140 mg/dL      Peak postprandial:   less than 180 mg/dL (1-2 hours)      Critically ill patients:  140 - 180 mg/dL    Results for JAYSA, KISE (MRN [id number]) as of 03/16/2015 09:44  Ref. Range 03/15/2015 23:25 03/16/2015 08:22  Glucose-Capillary Latest Ref Range: 65-99 mg/dL 389 (H) 435 (H)    Reason for assessment: elevated CBG  Diabetes history: Type 2 Outpatient Diabetes medications: Humalog 75/25 30 units bid Current orders for Inpatient glycemic control: Novolog 70/30 30 units bid, Novolog 0-9 units tid and hs  Please consider adding mealtime insulin- Novolog 4 units tid and increasing Novolog correction to moderate correction scale 0-15 units tid while she is on steroids.  Continue Novolog correction scale 0-9 units at hs.   Gentry Fitz, RN, BA, MHA, CDE Diabetes Coordinator Inpatient Diabetes Program  [phone] (Team Pager) [phone] ([place]) 03/16/2015 9:49 AM

## 2015-03-16 NOTE — Progress Notes (Signed)
Pt UA positive. Notified Dr Jannifer Franklin to see if she needed any other antibiotics to cover at this time. He said pt on Zosyn and Vancomycin which should cover.Jeffie Pollock, RN

## 2015-03-16 NOTE — Clinical Documentation Improvement (Signed)
Please specify the type and acuity of the CHF: Possible Clinical Conditions?  Chronic Systolic Congestive Heart Failure Chronic Diastolic Congestive Heart Failure Chronic Systolic & Diastolic Congestive Heart Failure Acute Systolic Congestive Heart Failure Acute Diastolic Congestive Heart Failure Acute Systolic & Diastolic Congestive Heart Failure Acute on Chronic Systolic Congestive Heart Failure Acute on Chronic Diastolic Congestive Heart Failure Acute on Chronic Systolic & Diastolic Congestive Heart Failure Other Condition Cannot Clinically Determine  Supporting Information:(As per notes) "History of coronary artery disease, CHF, AICD"   Thank You, Alessandra Grout, RN, BSN, CCDS,Clinical Documentation Specialist:  [phone]  [phone]=[facility]- Health Information Management

## 2015-03-17 ENCOUNTER — Inpatient Hospital Stay: Payer: Medicare Other | Admitting: Internal Medicine

## 2015-03-17 ENCOUNTER — Inpatient Hospital Stay: Payer: Medicare Other

## 2015-03-17 DIAGNOSIS — Z853 Personal history of malignant neoplasm of breast: Secondary | ICD-10-CM

## 2015-03-17 DIAGNOSIS — E119 Type 2 diabetes mellitus without complications: Secondary | ICD-10-CM

## 2015-03-17 DIAGNOSIS — R0602 Shortness of breath: Secondary | ICD-10-CM

## 2015-03-17 DIAGNOSIS — I2510 Atherosclerotic heart disease of native coronary artery without angina pectoris: Secondary | ICD-10-CM

## 2015-03-17 DIAGNOSIS — K769 Liver disease, unspecified: Secondary | ICD-10-CM

## 2015-03-17 DIAGNOSIS — C50911 Malignant neoplasm of unspecified site of right female breast: Secondary | ICD-10-CM

## 2015-03-17 DIAGNOSIS — Z87891 Personal history of nicotine dependence: Secondary | ICD-10-CM

## 2015-03-17 DIAGNOSIS — Z95810 Presence of automatic (implantable) cardiac defibrillator: Secondary | ICD-10-CM

## 2015-03-17 DIAGNOSIS — Z9221 Personal history of antineoplastic chemotherapy: Secondary | ICD-10-CM

## 2015-03-17 DIAGNOSIS — R599 Enlarged lymph nodes, unspecified: Secondary | ICD-10-CM

## 2015-03-17 DIAGNOSIS — R531 Weakness: Secondary | ICD-10-CM

## 2015-03-17 DIAGNOSIS — Z79811 Long term (current) use of aromatase inhibitors: Secondary | ICD-10-CM

## 2015-03-17 DIAGNOSIS — Z9011 Acquired absence of right breast and nipple: Secondary | ICD-10-CM

## 2015-03-17 DIAGNOSIS — I509 Heart failure, unspecified: Secondary | ICD-10-CM

## 2015-03-17 DIAGNOSIS — Z923 Personal history of irradiation: Secondary | ICD-10-CM

## 2015-03-17 DIAGNOSIS — R918 Other nonspecific abnormal finding of lung field: Secondary | ICD-10-CM

## 2015-03-17 LAB — CBC
HCT: 29.3 % — ABNORMAL LOW (ref 35.0–47.0)
Hemoglobin: 9.8 g/dL — ABNORMAL LOW (ref 12.0–16.0)
MCH: 30 pg (ref 26.0–34.0)
MCHC: 33.5 g/dL (ref 32.0–36.0)
MCV: 89.3 fL (ref 80.0–100.0)
PLATELETS: 210 10*3/uL (ref 150–440)
RBC: 3.28 MIL/uL — AB (ref 3.80–5.20)
RDW: 18.3 % — AB (ref 11.5–14.5)
WBC: 25.2 10*3/uL — ABNORMAL HIGH (ref 3.6–11.0)

## 2015-03-17 LAB — BASIC METABOLIC PANEL
Anion gap: 12 (ref 5–15)
BUN: 48 mg/dL — AB (ref 6–20)
CO2: 21 mmol/L — ABNORMAL LOW (ref 22–32)
CREATININE: 2.49 mg/dL — AB (ref 0.44–1.00)
Calcium: 8.3 mg/dL — ABNORMAL LOW (ref 8.9–10.3)
Chloride: 97 mmol/L — ABNORMAL LOW (ref 101–111)
GFR calc non Af Amer: 17 mL/min — ABNORMAL LOW (ref >60)
GFR, EST AFRICAN AMERICAN: 20 mL/min — AB (ref >60)
Glucose, Bld: 417 mg/dL — ABNORMAL HIGH (ref 65–99)
Potassium: 4.8 mmol/L (ref 3.5–5.1)
SODIUM: 130 mmol/L — AB (ref 135–145)

## 2015-03-17 LAB — GLUCOSE, CAPILLARY
GLUCOSE-CAPILLARY: 449 mg/dL — AB (ref 65–99)
GLUCOSE-CAPILLARY: 435 mg/dL — AB (ref 65–99)
Glucose-Capillary: 390 mg/dL — ABNORMAL HIGH (ref 65–99)
Glucose-Capillary: 297 mg/dL — ABNORMAL HIGH (ref 65–99)
Glucose-Capillary: 258 mg/dL — ABNORMAL HIGH (ref 65–99)

## 2015-03-17 LAB — C DIFFICILE QUICK SCREEN W PCR REFLEX
C DIFFICILE (CDIFF) TOXIN: NEGATIVE
C DIFFICLE (CDIFF) ANTIGEN: NEGATIVE
C Diff interpretation: NEGATIVE

## 2015-03-17 MED ORDER — INSULIN ASPART 100 UNIT/ML ~~LOC~~ SOLN
0.0000 [IU] | Freq: Three times a day (TID) | SUBCUTANEOUS | Status: DC
  Administered 2015-03-17: 16:00:00 11 [IU] via SUBCUTANEOUS
  Administered 2015-03-18: 09:00:00 4 [IU] via SUBCUTANEOUS
  Administered 2015-03-18: 3 [IU] via SUBCUTANEOUS
  Administered 2015-03-19 (×2): 7 [IU] via SUBCUTANEOUS
  Administered 2015-03-19: 17:00:00 15 [IU] via SUBCUTANEOUS
  Administered 2015-03-20 – 2015-03-21 (×2): 3 [IU] via SUBCUTANEOUS
  Filled 2015-03-17: qty 3
  Filled 2015-03-17 (×2): qty 7
  Filled 2015-03-17: qty 3
  Filled 2015-03-17: qty 11
  Filled 2015-03-17: qty 4
  Filled 2015-03-17: qty 15
  Filled 2015-03-17: qty 3

## 2015-03-17 MED ORDER — INSULIN ASPART 100 UNIT/ML ~~LOC~~ SOLN: 0.0000 [IU] | Freq: Three times a day (TID) | SUBCUTANEOUS | Status: DC

## 2015-03-17 MED ORDER — PREDNISONE 50 MG PO TABS
50.0000 mg | ORAL_TABLET | Freq: Every day | ORAL | Status: DC
  Administered 2015-03-18 – 2015-03-19 (×2): 50 mg via ORAL
  Filled 2015-03-17 (×2): qty 1

## 2015-03-17 MED ORDER — SODIUM CHLORIDE 0.9 % IV SOLN
INTRAVENOUS | Status: AC
  Administered 2015-03-17 – 2015-03-18 (×3): via INTRAVENOUS

## 2015-03-17 MED ORDER — IPRATROPIUM-ALBUTEROL 0.5-2.5 (3) MG/3ML IN SOLN
3.0000 mL | Freq: Three times a day (TID) | RESPIRATORY_TRACT | Status: DC
  Administered 2015-03-17 – 2015-03-21 (×13): 3 mL via RESPIRATORY_TRACT
  Filled 2015-03-17 (×12): qty 3

## 2015-03-17 MED ORDER — INSULIN ASPART 100 UNIT/ML ~~LOC~~ SOLN
20.0000 [IU] | Freq: Once | SUBCUTANEOUS | Status: AC
  Administered 2015-03-17: 20 [IU] via SUBCUTANEOUS

## 2015-03-17 MED ORDER — GLUCERNA SHAKE PO LIQD
237.0000 mL | Freq: Three times a day (TID) | ORAL | Status: DC
  Administered 2015-03-17 – 2015-03-21 (×10): 237 mL via ORAL

## 2015-03-17 MED ORDER — RISAQUAD PO CAPS
1.0000 | ORAL_CAPSULE | Freq: Every day | ORAL | Status: DC
  Administered 2015-03-17 – 2015-03-21 (×5): 1 via ORAL
  Filled 2015-03-17 (×5): qty 1

## 2015-03-17 MED ORDER — INSULIN ASPART 100 UNIT/ML ~~LOC~~ SOLN
0.0000 [IU] | Freq: Every day | SUBCUTANEOUS | Status: DC
  Administered 2015-03-17: 3 [IU] via SUBCUTANEOUS
  Administered 2015-03-19: 2 [IU] via SUBCUTANEOUS
  Filled 2015-03-17: qty 3
  Filled 2015-03-17: qty 2

## 2015-03-17 NOTE — Plan of Care (Signed)
Problem: Discharge Progression Outcomes Goal: Other Discharge Outcomes/Goals Plan of Care Progress to Goal:  PT SOB ON EXERTION, NP COUGH NOTED AT INTERVALS, O2 SATS WNL OFF O2 AT THIS TIME

## 2015-03-17 NOTE — Care Management (Signed)
Admitted to [facility] with the diagnosis of acute bronchitis. Breast Cancer in the past, new diagnosis of lung. Husband is  BL [phone]). No home Health. No skilled facility. Uses no home oxygen. States her husband helps with the basic activities of daily living and does all the errands, cooks, and cleans. Uses a rolling walker to aid in ambulation.. Seen Dr. Netty Starring in the past, but sees Drs Ma Hillock and [facility] now.  Encouraged sitting up on the side of the bed. Daughter-in-law at the bedside. Enteric precautions. Shelbie Ammons RN MSN Care Management [phone]

## 2015-03-17 NOTE — Progress Notes (Signed)
[facility] [place] NAME: Lauren Best    MR#:  [id number]  DATE OF BIRTH:  1933-01-03  SUBJECTIVE:  CHIEF COMPLAINT:   Chief Complaint  Patient presents with  . Cough   Respiratory status improved. Now with diarrhea. Blood sugars are elevated.  REVIEW OF SYSTEMS:   Review of Systems  Constitutional: Negative for fever.  Respiratory: Negative for shortness of breath.   Cardiovascular: Negative for chest pain and palpitations.  Gastrointestinal: Negative for nausea, vomiting and abdominal pain.  Genitourinary: Negative for dysuria.  Psychiatric/Behavioral: The patient is nervous/anxious.     DRUG ALLERGIES:   Allergies  Allergen Reactions  . Codeine Rash  . Eliquis [Apixaban] Rash and Other (See Comments)    Reaction:  Bleeding     VITALS:  Blood pressure 119/50, pulse 85, temperature 97.7 F (36.5 C), temperature source Oral, resp. rate 18, height 5\' 3"  (1.6 m), weight 72.439 kg (159 lb 11.2 oz), SpO2 91 %.  PHYSICAL EXAMINATION:  GENERAL:  81 y.o.-year-old patient lying in the bed, on bedpan, no distress LUNGS: Anterior examination, lungs clear to auscultation bilaterally, no dyspnea, but air movement CARDIOVASCULAR: S1, S2 normal. No murmurs, rubs, or gallops.  ABDOMEN: Soft, nontender, nondistended. Bowel sounds present. No organomegaly or mass.  EXTREMITIES: No pedal edema, cyanosis, or clubbing.  NEUROLOGIC: Cranial nerves II through XII are intact. Muscle strength 5/5 in all extremities. Sensation intact. Gait not checked.  PSYCHIATRIC: The patient is alert and oriented x 3. Anxious SKIN: No obvious rash, lesion, or ulcer.    LABORATORY PANEL:   CBC  Recent Labs Lab 03/17/15 0324  WBC 25.2*  HGB 9.8*  HCT 29.3*  PLT 210   ------------------------------------------------------------------------------------------------------------------  Chemistries   Recent Labs Lab 03/17/15 0324  NA 130*  K  4.8  CL 97*  CO2 21*  GLUCOSE 417*  BUN 48*  CREATININE 2.49*  CALCIUM 8.3*   ------------------------------------------------------------------------------------------------------------------  Cardiac Enzymes  Recent Labs Lab 03/15/15 2131  TROPONINI 0.04*   ------------------------------------------------------------------------------------------------------------------  RADIOLOGY:  Dg Chest 2 View  03/15/2015   CLINICAL DATA:  Cough, recent pneumonia.  EXAM: CHEST  2 VIEW  COMPARISON:  03/04/2015 and 01/31/2015  FINDINGS: Left-sided pacemaker unchanged. Lungs are hypoinflated with subtle linear density over the right midlung likely scarring. No focal consolidation or effusion. Stable prominence of the central pulmonary interstitium. Stable mild cardiomegaly. Calcified plaque over the aortic arch. Mild degenerative change of the spine.  IMPRESSION: Hypoinflation without acute cardiopulmonary disease.  Mild stable prominence of the central pulmonary interstitium. Linear scarring right midlung.   Electronically Signed   By: Marin Olp M.D.   On: 03/15/2015 15:25   Ct Angio Chest Pe W/cm &/or Wo Cm  03/15/2015   CLINICAL DATA:  Short of breath. Malignancy. Patient evaluated 03/04/2015 and diagnosed with bronchitis. Cough productive of sputum.  EXAM: CT ANGIOGRAPHY CHEST WITH CONTRAST  TECHNIQUE: Multidetector CT imaging of the chest was performed using the standard protocol during bolus administration of intravenous contrast. Multiplanar CT image reconstructions and MIPs were obtained to evaluate the vascular anatomy.  CONTRAST:  63mL OMNIPAQUE IOHEXOL 350 MG/ML SOLN  COMPARISON:  03/15/2015.  03/04/2015.  FINDINGS: Bones: Thoracic spine DISH. Osteopenia. No compression fracture of the thoracic spine.  Cardiovascular: Unchanged single lead LEFT subclavian cardiac pacemaker. No acute aortic abnormality. Negative for pulmonary embolism. Respiratory motion artifact mildly degrades the  evaluation.  Lungs: Again, respiratory motion artifact is present. Radiation fibrosis is present in  the RIGHT upper lobe along the subpleural surface anteriorly. Partially calcified nodule is present in the LEFT suprahilar lung. Multiple other smaller pulmonary nodules are present, some of which have a peribronchovascular distribution. Pulmonary nodules appears similar to prior.  Central airways: Patent.  Effusions: Small chronic dependently layering RIGHT pleural effusion. No LEFT pleural effusion. Compressive/relaxation atelectasis associated with the effusions.  There is studding of the pulmonary fissures bilaterally suggesting and lymphangitic spread of tumor.  Lymphadenopathy: No axillary adenopathy. Mediastinal adenopathy is present. 1 centimeter pretracheal node is present. Borderline and enlarged prevascular and AP window nodes are present. RIGHT hilar adenopathy is present extending to the RIGHT infrahilar region. The hilar adenopathy appears similar to the prior exam.  No pericardial effusion is identified however there are nodules along the anterior pericardium compatible with metastatic disease. One of these is lateral to the RIGHT atrium and the other is anterior to the pulmonic valve.  Esophagus: Small hiatal hernia.  Upper abdomen: Infiltrative low-density lesions are present in both hepatic lobes, most compatible with metastatic disease. No change from prior. No acute upper abdominal abnormality.  Other: RIGHT mastectomy.  Review of the MIP images confirms the above findings.  IMPRESSION: 1. Negative for pulmonary embolus or acute aortic abnormality. 2. Atherosclerosis and coronary artery disease. 3. RIGHT mastectomy with radiation fibrosis in the anterior RIGHT upper lobe. 4. Mediastinal and hilar adenopathy with pleural nodularity, most compatible with lymphangitic spread of tumor. Infiltration of the liver compatible with metastatic disease.   Electronically Signed   By: Dereck Ligas M.D.   On:  03/15/2015 18:24    EKG:   Orders placed or performed during the hospital encounter of 03/15/15  . ED EKG  . ED EKG  . EKG 12-Lead  . EKG 12-Lead  . EKG    ASSESSMENT AND PLAN:   #1 acute bronchitis/reactive airway: Improving - Possibly due to lymphangitic spread of cancer versus reactive airway - Discontinue Solu-Medrol, start prednisone, nebulizers, continue Rocephin (she did complete a full course of Levaquin as an outpatient)  #2 nodules in the lung and liver - Oncology to see again today, likely recurrence of breast cancer - CT abdomen pelvis is ordered, creatinine elevated today so we'll need to reevaluate in the morning tomorrow for the study - Possible bronchoscopy during this admission due to her current symptoms of shortness of breath, coughing which could be due to the lymphangitic spread of her cancer  #3 diabetes mellitus type 2 uncontrolled - A1c is 9.2, poor control at home - CBG's have been very elevated due to Solu-Medrol and also missing her dose of 7030 yesterday morning - Hydrate, resume 70/30, continue sliding scale resistant scale, carbohydrate modified diet  #4 coronary artery disease, congestive heart failure/AICD in place - Ejection fraction is 30% by echo February 2016 - No chest pain or EKG change, troponins have been stable this is not ACS - No edema or other sign of congestive heart failure exacerbation at this time - Continue home regimen including aspirin, statin, Plavix, hold Plavix for today due to renal injury - Continue with strict I's and O's low-salt diet  #5 acute on chronic kidney failure stage III - Big bump in creatinine today from 1.4-2.49 due to hyperglycemia, steroid, dehydration, CT contrast yesterday - Continue hydration, control blood sugars, monitor renal function  #6 diarrhea - Check C. difficile and stool cultures, if negative start Imodium  #7 leukocytosis - Likely due to steroid's, check C. difficile  CODE STATUS: Full  TOTAL TIME TAKING CARE OF THIS PATIENT: 30 minutes.  Greater than 50% of time spent in care coordination and counseling. Spoke with patient and nursing. POSSIBLE D/C IN 1-2, DEPENDING ON CLINICAL CONDITION.   Myrtis Ser M.D on 03/17/2015 at 7:54 AM  Between 7am to 6pm - Pager - [phone]  After 6pm go to [email or web address] - password [email or web address] [facility]  Office  [phone]  CC: Primary care physician; Dion Body, MD

## 2015-03-17 NOTE — Progress Notes (Signed)
Dr. Volanda Napoleon notified of blood sugar of 435.  Telephone order received to for 20units of novolog SQ to be given and recheck blood sugar in 30 minutes.  Dr. Volanda Napoleon does not want stat lab draw verification for elevated blood sugar.

## 2015-03-17 NOTE — Progress Notes (Signed)
Inpatient Diabetes Program Recommendations  AACE/ADA: New Consensus Statement on Inpatient Glycemic Control (2013)  Target Ranges:  Prepandial:   less than 140 mg/dL      Peak postprandial:   less than 180 mg/dL (1-2 hours)      Critically ill patients:  140 - 180 mg/dL   Results for Lauren Best, Lauren Best (MRN [id number]) as of 03/17/2015 08:05  Ref. Range 03/16/2015 18:56 03/16/2015 18:59 03/16/2015 22:01 03/16/2015 22:05 03/17/2015 07:42  Glucose-Capillary Latest Ref Range: 65-99 mg/dL 518 (H) 518 (H) 549 (H) 490 (H) 449 (H)    Reason for assessment: elevated CBG  Diabetes history: Type 2 Outpatient Diabetes medications: Humalog 75/25 30 units bid Current orders for Inpatient glycemic control: Novolog 70/30 30 units bid, Novolog 0-9 units tid and hs  Please consider adding mealtime insulin- Novolog 5 units tid and increasing Novolog correction to moderate correction scale 0-15 units tid while she is on steroids. Continue Novolog correction scale 0-9 units at hs.   Patient has renal disease therefore I am reluctant to increase the correction for fear it will "stack up" and cause hypoglycemia. Would ideally be treated with an insulin drip in ICU.   Gentry Fitz, RN, BA, MHA, CDE Diabetes Coordinator Inpatient Diabetes Program  [phone] (Team Pager) [phone] ([place]) 03/16/2015 9:49 AM

## 2015-03-17 NOTE — Plan of Care (Signed)
Problem: Discharge Progression Outcomes Goal: Discharge plan in place and appropriate Individualization of Care Outcome: Progressing 1. Pt likes to be called Lauren Best 2. Lives at home with spouse who is an active participant in patient's care 3. Extensive history of cancer 4. Pt has a PCP. Co-morbidities are medically managed.  5. Pt declines spiritual support at this time but recently loss her 79 year old granddaughter unexpectedly.

## 2015-03-17 NOTE — Progress Notes (Signed)
Initial Nutrition Assessment  INTERVENTION:   Meals and Snacks: Cater to patient preferences Medical Food Supplement Therapy: Recommend Glucerna Shake po TID, each supplement provides 220 kcal and 10 grams of protein   NUTRITION DIAGNOSIS:   Inadequate oral intake related to poor appetite as evidenced by per patient/family report.  GOAL:   Patient will meet greater than or equal to 90% of their needs  MONITOR:    (Energy Intake, Anthropometrics, Glucose Profile, Pulmonary Profile)  REASON FOR ASSESSMENT:   Consult Poor PO  ASSESSMENT:   Pt  admitted with bronchitis, per MD note nodules on lung and liver. Pt also with acute on chronic renal failure.  Past Medical History  Diagnosis Date  . Diabetes mellitus without complication   . Hypertension   . Coronary artery disease   . Cancer   . CHF (congestive heart failure)   . AICD (automatic cardioverter/defibrillator) present     Diet Order:  Diet heart healthy/carb modified Room service appropriate?: Yes; Fluid consistency:: Thin    Current Nutrition: Pt reports eating well this am; ate 75% of eggs and some fruit. Pt muffin was uneaten on tray.  Food/Nutrition-Related History: Pt reports eating poorly for one week PTA and reports eating better since she has been admitted. Recorded po intake 100% of meals yesterday.  Pt reports one Boost daily PTA.    Medications: Novolog, Prednisone, vitamin B12, vitamin D, NS at 164mL/hr  Electrolyte/Renal Profile and Glucose Profile:   Recent Labs Lab 03/15/15 1620 03/16/15 0545 03/17/15 0324  NA 128* 130* 130*  K 4.3 3.9 4.8  CL 94* 97* 97*  CO2 22 22 21*  BUN 26* 28* 48*  CREATININE 1.52* 1.41* 2.49*  CALCIUM 8.1* 8.1* 8.3*  GLUCOSE 495* 419* 417*   Protein Profile: No results for input(s): ALBUMIN in the last 168 hours.   Gastrointestinal Profile: Last BM:  03/17/2015 loose stools  Skin:  Reviewed, no issues  Nutrition-Focused Physical Exam Findings:  Nutrition-Focused physical exam completed. Findings are WDL for fat depletion, muscle depletion, and edema.    Weight Change: Pt reports weight of 163lbs a 3-4 weeks ago. Per [facility] 163lbs on 03/04/2015 (2.5% weight loss in 3 weeks)  Height:   Ht Readings from Last 1 Encounters:  03/15/15 5\' 3"  (1.6 m)    Weight:   Wt Readings from Last 1 Encounters:  03/17/15 159 lb 11.2 oz (72.439 kg)   Wt Readings from Last 10 Encounters:  03/17/15    159 lb 11.2 oz (72.439 kg)  03/04/15    163 lb (73.936 kg)  01/31/15    163 lb (73.936 kg)  01/09/15    163 lb (73.936 kg)   BMI:  Body mass index is 28.3 kg/(m^2).  Estimated Nutritional Needs:   Kcal:  1663-1965kcals, BEE: 1163kcals, TEE: (IF 1.1-1.3)(AF 1.3)   Protein:  72-86g protein (1.0-1.2g/kg)   Fluid:  1810-2148mL of fluid (25-42mL/kg)  EDUCATION NEEDS:   Education needs no appropriate at this time   [place], RD, LDN Pager [phone]

## 2015-03-17 NOTE — Care Management Important Message (Signed)
Important Message  Patient Details  Name: YORLEY BUCH MRN: [id number] Date of Birth: 1933/08/01   Medicare Important Message Given:   (1st IM)    Shelbie Ammons 03/17/2015, 12:52 PM

## 2015-03-17 NOTE — Consult Note (Signed)
ONCOLOGY CONSULTATION NOTE -   Reason for Consultation - history of breast cancer, now with multiple liver lesions on CT scan chest.  HISTORY OF PRESENT ILLNESS: Lauren Best is a 81 year old female with a known history of diabetes, hypertension, coronary artery disease, breast cancer, CHF, AICD she had complain of feeling short of breath was admitted to hospital with complaints of recurrent cough, shortness of breath on exertion and progressive weakness. She recently received active by dates for respiratory tract infection symptoms do not improve. She had a CT scan of the chest done upon admission which was negative for pulmonary embolism but reported multiple liver lesions and lung nodules suspicious for metastatic malignancy. Patient has known history of stage IIIB right breast cancer in 2009 initially received neo-adjuvant chemotherapy with Adriamycin/Cytoxan 4, then weekly Taxol 8 after which she discontinued due to severe neuropathy, then had modified radical mastectomy which still showed 3.6 cm residual cancer and to off 11 axillary lymph nodes with metastatic carcinoma with extranodal extension. She then completed radiation therapy and has been on hormonal therapy with aromatase inhibitor since November 2009. States that she continues to take exemestane regularly. She never smoked herself, but gives history of secondhand smoke exposure from her parents in the remote past. No other new abdominal pain of pelvic pain, constipation, bright red blood in stools, melena, hematuria. She denies any new bone pains.   PAST MEDICAL HISTORY:  Past Medical History  Diagnosis Date  . Diabetes mellitus without complication   . Hypertension   . Coronary artery disease   . Cancer   . CHF (congestive heart failure)   . AICD (automatic cardioverter/defibrillator) present     PAST SURGICAL HISTORY:  Past Surgical History  Procedure Laterality Date  . Cardiac defibrillator  placement Left   . Mastectomy Right   . Joint replacement Right     knee    SOCIAL HISTORY:  History  Substance Use Topics  . Smoking status: Never Smoker   . Smokeless tobacco: Never Used  . Alcohol Use: No    FAMILY HISTORY:  Family History  Problem Relation Age of Onset  . Alzheimer's disease Mother   . Congestive Heart Failure Father     DRUG ALLERGIES:  Allergies  Allergen Reactions  . Codeine Rash  . Eliquis [Apixaban] Rash and Other (See Comments)    Reaction: Bleeding     REVIEW OF SYSTEMS:  CONSTITUTIONAL: As in history of present illness above. Denies fevers or chills. No night sweats.  HEENT: Denies headaches or epistaxis. No ear or jaw pain. No new sinus symptoms.  RESPIRATORY: excessive cough and shortness of breath, no wheezing or hemoptysis.  CARDIOVASCULAR: No chest pain, orthopnea, edema.  GASTROINTESTINAL: No nausea, vomiting, diarrhea or abdominal pain.  GENITOURINARY: No dysuria, hematuria.  ENDOCRINE: No polyuria polydipsia. Appetite is fair.  HEMATOLOGY: No anemia, easy bruising or bleeding SKIN: No rash or lesion. MUSCULOSKELETAL: No joint pain or arthritis.  NEUROLOGIC: No tingling, numbness, or new focal weakness.     MEDICATIONS AT HOME:  Prior to Admission medications   Medication Sig      albuterol (PROVENTIL HFA;VENTOLIN HFA) 108 (90 BASE) MCG/ACT inhaler Inhale 2 puffs into the lungs every 6 (six) hours as needed for wheezing or shortness of breath.      atorvastatin (LIPITOR) 40 MG tablet Take 40 mg by mouth daily.      benzonatate (TESSALON PERLES) 100 MG capsule Take 1 capsule (100 mg total) by mouth every 6 (six)  hours as needed for cough.      clopidogrel (PLAVIX) 75 MG tablet Take 75 mg by mouth daily.      furosemide (LASIX) 20 MG tablet Take 20 mg by mouth daily.      insulin lispro protamine-lispro (HUMALOG 75/25 MIX) (75-25) 100 UNIT/ML SUSP  injection Inject 40 Units into the skin 2 (two) times daily.      levofloxacin (LEVAQUIN) 750 MG tablet Take 750 mg by mouth daily.      lisinopril (PRINIVIL,ZESTRIL) 5 MG tablet Take 5 mg by mouth daily.      metoprolol succinate (TOPROL-XL) 25 MG 24 hr tablet Take 12.5 mg by mouth daily.      Omega-3 Fatty Acids (FISH OIL) 1000 MG CAPS Take 1 capsule by mouth daily.      vitamin B-12 (CYANOCOBALAMIN) 1000 MCG tablet Take 1,000 mcg by mouth daily.      Vitamin D, Ergocalciferol, (DRISDOL) 50000 UNITS CAPS capsule Take 50,000 Units by mouth every 7 (seven) days. Pt takes on Monday.      traMADol (ULTRAM) 50 MG tablet Take 1 tablet (50 mg total) by mouth every 8 (eight) hours as needed. Patient not taking: Reported on 03/15/2015         PHYSICAL EXAMINATION:   VITAL SIGNS: 98, 79, 20, 116/49, 94% GENERAL: Patient is moderately built and nourished individual, weak-looking, otherwise alert and oriented and converses appropriately. No acute distress. No icterus. Pallor present.  HEENT: Extraocular movements intact. No cervical adenopathy   LUNGS: Decreased breath sounds at bases bilaterally, otherwise good air entry. No crepitations or rhonchi.  CARDIOVASCULAR: S1, S2, regular. ABDOMEN: Soft, nontender, nondistended. Liver is palpable.  EXTREMITIES: Trace pedal edema.  NEUROLOGIC: Cranial nerves intact, moves all extremities spontaneously  SKIN: No obvious rash or major bruising LYMPHATICS: No axillary or inguinal adenopathy   LABORATORY PANEL:  CBC  Last Labs      Recent Labs Lab 03/15/15 1620  WBC 14.3*  HGB 9.4*  HCT 29.2*  PLT 180  MCV 88.8  MCH 28.5  MCHC 32.1  RDW 17.6*  LYMPHSABS 0.6*  MONOABS 1.2*  EOSABS 0.0  BASOSABS 0.0     ------------------------------------------------------------------------------------------------------------------  Chemistries   Last Labs      Recent Labs Lab  03/15/15 1620  NA 128*  K 4.3  CL 94*  CO2 22  GLUCOSE 495*  BUN 26*  CREATININE 1.52*  CALCIUM 8.1*     ----------------------------------         RADIOLOGY:  Imaging Results (Last 48 hours)    Dg Chest 2 View  03/15/2015 CLINICAL DATA: Cough, recent pneumonia. EXAM: CHEST 2 VIEW COMPARISON: 03/04/2015 and 01/31/2015 FINDINGS: Left-sided pacemaker unchanged. Lungs are hypoinflated with subtle linear density over the right midlung likely scarring. No focal consolidation or effusion. Stable prominence of the central pulmonary interstitium. Stable mild cardiomegaly. Calcified plaque over the aortic arch. Mild degenerative change of the spine. IMPRESSION: Hypoinflation without acute cardiopulmonary disease. Mild stable prominence of the central pulmonary interstitium. Linear scarring right midlung. Electronically Signed By: Marin Olp M.D. On: 03/15/2015 15:25   Ct Angio Chest Pe W/cm &/or Wo Cm  03/15/2015 CLINICAL DATA: Short of breath. Malignancy. Patient evaluated 03/04/2015 and diagnosed with bronchitis. Cough productive of sputum. EXAM: CT ANGIOGRAPHY CHEST WITH CONTRAST TECHNIQUE: Multidetector CT imaging of the chest was performed using the standard protocol during bolus administration of intravenous contrast. Multiplanar CT image reconstructions and MIPs were obtained to evaluate the vascular anatomy. CONTRAST: 50mL OMNIPAQUE IOHEXOL 350  MG/ML SOLN COMPARISON: 03/15/2015. 03/04/2015. FINDINGS: Bones: Thoracic spine DISH. Osteopenia. No compression fracture of the thoracic spine. Cardiovascular: Unchanged single lead LEFT subclavian cardiac pacemaker. No acute aortic abnormality. Negative for pulmonary embolism. Respiratory motion artifact mildly degrades the evaluation. Lungs: Again, respiratory motion artifact is present. Radiation fibrosis is present in the RIGHT upper lobe along the subpleural surface anteriorly. Partially calcified  nodule is present in the LEFT suprahilar lung. Multiple other smaller pulmonary nodules are present, some of which have a peribronchovascular distribution. Pulmonary nodules appears similar to prior. Central airways: Patent. Effusions: Small chronic dependently layering RIGHT pleural effusion. No LEFT pleural effusion. Compressive/relaxation atelectasis associated with the effusions. There is studding of the pulmonary fissures bilaterally suggesting and lymphangitic spread of tumor. Lymphadenopathy: No axillary adenopathy. Mediastinal adenopathy is present. 1 centimeter pretracheal node is present. Borderline and enlarged prevascular and AP window nodes are present. RIGHT hilar adenopathy is present extending to the RIGHT infrahilar region. The hilar adenopathy appears similar to the prior exam. No pericardial effusion is identified however there are nodules along the anterior pericardium compatible with metastatic disease. One of these is lateral to the RIGHT atrium and the other is anterior to the pulmonic valve. Esophagus: Small hiatal hernia. Upper abdomen: Infiltrative low-density lesions are present in both hepatic lobes, most compatible with metastatic disease. No change from prior. No acute upper abdominal abnormality. Other: RIGHT mastectomy. Review of the MIP images confirms the above findings. IMPRESSION: 1. Negative for pulmonary embolus or acute aortic abnormality. 2. Atherosclerosis and coronary artery disease. 3. RIGHT mastectomy with radiation fibrosis in the anterior RIGHT upper lobe. 4. Mediastinal and hilar adenopathy with pleural nodularity, most compatible with lymphangitic spread of tumor. Infiltration of the liver compatible with metastatic disease. Electronically Signed By: Dereck Ligas M.D. On: 03/15/2015 18:24     Impression/Recommendations -  81 year old female patient with history of locally advanced (stage IIIB) right breast invasive ductal carcinoma treated in  2009 as detailed above, who continues to take hormonal therapy with aromatase inhibitor exemestane on a regular basis. Patient is admitted with recurrent respiratory symptoms as described above. CT scan of the chest reports multiple liver lesions compatible with metastatic disease, along with mediastinal and hilar adenopathy, pleural nodularity concerning for lymphangitic spread of tumor. Patient was explained that based upon radiological evaluation, she most likely has stage IV metastatic malignancy which could be recurrent metastatic breast cancer versus other malignancy. She needs further evaluation including biopsy for tissue diagnosis. Plan is to get CA27-29 level, baseline PT and PTT and request ultrasound-guided liver biopsy on Monday August 8 if possible. Will also request a PET scan for restaging breast cancer and evaluate extent of metastatic disease. The patient and her daughter-in-law present at bedside were also explained that if she does have liver metastasis, then it represents stage IV malignancy which is incurable stage disease and treatments offered would be with palliative intent only, and overall prognosis is poor especially if she does not respond to treatment or is unable to take cancer treatment. Will follow up after above workup is completed and make further recommendations. They are agreeable to this plan. Thank you for the referral, please feel free to contact me if any additional questions.

## 2015-03-18 LAB — CBC
HEMATOCRIT: 27.4 % — AB (ref 35.0–47.0)
Hemoglobin: 9.3 g/dL — ABNORMAL LOW (ref 12.0–16.0)
MCH: 29.7 pg (ref 26.0–34.0)
MCHC: 34.1 g/dL (ref 32.0–36.0)
MCV: 87 fL (ref 80.0–100.0)
Platelets: 239 10*3/uL (ref 150–440)
RBC: 3.15 MIL/uL — AB (ref 3.80–5.20)
RDW: 17.6 % — ABNORMAL HIGH (ref 11.5–14.5)
WBC: 20.9 10*3/uL — AB (ref 3.6–11.0)

## 2015-03-18 LAB — APTT: aPTT: 33 seconds (ref 24–36)

## 2015-03-18 LAB — BASIC METABOLIC PANEL
Anion gap: 9 (ref 5–15)
BUN: 60 mg/dL — AB (ref 6–20)
CALCIUM: 8 mg/dL — AB (ref 8.9–10.3)
CO2: 24 mmol/L (ref 22–32)
Chloride: 103 mmol/L (ref 101–111)
Creatinine, Ser: 2.24 mg/dL — ABNORMAL HIGH (ref 0.44–1.00)
GFR, EST AFRICAN AMERICAN: 23 mL/min — AB (ref >60)
GFR, EST NON AFRICAN AMERICAN: 20 mL/min — AB (ref >60)
GLUCOSE: 181 mg/dL — AB (ref 65–99)
POTASSIUM: 3.9 mmol/L (ref 3.5–5.1)
Sodium: 136 mmol/L (ref 135–145)

## 2015-03-18 LAB — GLUCOSE, CAPILLARY
GLUCOSE-CAPILLARY: 160 mg/dL — AB (ref 65–99)
Glucose-Capillary: 179 mg/dL — ABNORMAL HIGH (ref 65–99)
Glucose-Capillary: 142 mg/dL — ABNORMAL HIGH (ref 65–99)
Glucose-Capillary: 60 mg/dL — ABNORMAL LOW (ref 65–99)
Glucose-Capillary: 60 mg/dL — ABNORMAL LOW (ref 65–99)
Glucose-Capillary: 114 mg/dL — ABNORMAL HIGH (ref 65–99)

## 2015-03-18 LAB — PROTIME-INR
INR: 1.33
Prothrombin Time: 16.7 seconds — ABNORMAL HIGH (ref 11.4–15.0)

## 2015-03-18 NOTE — Plan of Care (Signed)
Problem: Discharge Progression Outcomes Goal: Other Discharge Outcomes/Goals Plan of Care Progress to Goal:  Pt without c/o pain, cough at intervals tessalon pearls given for cough with relief, o2 sats wnl on room air at this time.

## 2015-03-18 NOTE — Progress Notes (Signed)
[place] [facility] [place] NAME: Lauren Best    MR#:  [id number]  DATE OF BIRTH:  11/11/1932  SUBJECTIVE:  CHIEF COMPLAINT:   Chief Complaint  Patient presents with  . Cough   Respiratory status improved. Now with diarrhea. Blood sugars are controlled. Feels much better  REVIEW OF SYSTEMS:   Review of Systems  Constitutional: Negative for fever.  Respiratory: Negative for shortness of breath.   Cardiovascular: Negative for chest pain and palpitations.  Gastrointestinal: Negative for nausea, vomiting and abdominal pain.  Genitourinary: Negative for dysuria.  Psychiatric/Behavioral: The patient is nervous/anxious.     DRUG ALLERGIES:   Allergies  Allergen Reactions  . Codeine Rash  . Eliquis [Apixaban] Rash and Other (See Comments)    Reaction:  Bleeding     VITALS:  Blood pressure 120/55, pulse 82, temperature 97.8 F (36.6 C), temperature source Oral, resp. rate 18, height 5\' 3"  (1.6 m), weight 75.615 kg (166 lb 11.2 oz), SpO2 95 %.  PHYSICAL EXAMINATION:  GENERAL:  81 y.o.-year-old patient lying in the bed, eating lunch LUNGS: Anterior examination, lungs clear to auscultation bilaterally, no dyspnea, but air movement CARDIOVASCULAR: S1, S2 normal. No murmurs, rubs, or gallops.  ABDOMEN: Soft, nontender, nondistended. Bowel sounds present. No organomegaly or mass.  EXTREMITIES: No pedal edema, cyanosis, or clubbing.  NEUROLOGIC: Cranial nerves II through XII are intact. Muscle strength 5/5 in all extremities. Sensation intact. Gait not checked.  PSYCHIATRIC: The patient is alert and oriented x 3. Anxious SKIN: No obvious rash, lesion, or ulcer.    LABORATORY PANEL:   CBC  Recent Labs Lab 03/18/15 0445  WBC 20.9*  HGB 9.3*  HCT 27.4*  PLT 239   ------------------------------------------------------------------------------------------------------------------  Chemistries   Recent Labs Lab 03/18/15 0445   NA 136  K 3.9  CL 103  CO2 24  GLUCOSE 181*  BUN 60*  CREATININE 2.24*  CALCIUM 8.0*   ------------------------------------------------------------------------------------------------------------------  Cardiac Enzymes  Recent Labs Lab 03/15/15 2131  TROPONINI 0.04*   ------------------------------------------------------------------------------------------------------------------  RADIOLOGY:  No results found.  EKG:   Orders placed or performed during the hospital encounter of 03/15/15  . ED EKG  . ED EKG  . EKG 12-Lead  . EKG 12-Lead  . EKG    ASSESSMENT AND PLAN:   #1 acute bronchitis/reactive airway: Improving, now breathing comfortably, still with episodes of coughing - Possibly due to lymphangitic spread of cancer versus reactive airway - Continue prednisone, nebulizers, continue Rocephin (she did complete a full course of Levaquin as an outpatient)  #2 nodules in the lung and liver - Oncology following. Agree with plan for tumor markers, liver biopsy, PET scan.  #3 diabetes mellitus type 2 uncontrolled: CBGs improving - A1c is 9.2, poor control at home - CBG's have been very elevated due to Solu-Medrol and also missing her dose of 7030 yesterday morning - Hydrate, resume 70/30, continue sliding scale resistant scale, carbohydrate modified diet  #4 coronary artery disease, congestive heart failure/AICD in place - Ejection fraction is 30% by echo February 2016 - No chest pain or EKG change, troponins have been stable this is not ACS - No edema or other sign of congestive heart failure exacerbation at this time - Continue home regimen including aspirin, statin, Plavix, hold Plavix for today due to renal injury - Continue with strict I's and O's low-salt diet  #5 acute on chronic kidney failure stage III - Creatinine continues to escalate despite hydration. This is likely  due to hyperglycemia, steroid, dehydration, CT contrast on admission. - As it is not  improving I will consult nephrology for assistance   CODE STATUS: Full   TOTAL TIME TAKING CARE OF THIS PATIENT: 30 minutes.  Greater than 50% of time spent in care coordination and counseling. Spoke with patient, her husband and nursing. POSSIBLE D/C IN 2-3 DEPENDING ON CLINICAL CONDITION.   Myrtis Ser M.D on 03/18/2015 at 12:32 PM  Between 7am to 6pm - Pager - [phone]  After 6pm go to [email or web address] - password [email or web address]  Office  [phone]  CC: Primary care physician; Dion Body, MD

## 2015-03-18 NOTE — Consult Note (Signed)
Date: 03/18/2015                  Patient Name:  Lauren Best  MRN: [id number]  DOB: Jun 16, 1933  Age / Sex: 81 y.o., female         PCP: Dion Body, MD                 Service Requesting Consult: Internal Medicine                 Reason for Consult: ARF            History of Present Illness: Patient is a 81 y.o. female with medical problems of breast cancer (metastatic), coronary disease, hypertension, diabetes, congestive heart failure, AICD, who was admitted to [facility] on 03/15/2015 for evaluation of shortness of breath.  Her baseline creatinine appears to be 1.4.  Creatinine increased to 2.5 but now is down to 2.2 today.  Patient had a CT angiogram with IV contrast on August 3 Nephrology consult has been requested for evaluation  Medications: Outpatient medications: Prescriptions prior to admission  Medication Sig Dispense Refill Last Dose  . albuterol (PROVENTIL HFA;VENTOLIN HFA) 108 (90 BASE) MCG/ACT inhaler Inhale 2 puffs into the lungs every 6 (six) hours as needed for wheezing or shortness of breath. 1 Inhaler 0 PRN at PRN  . atorvastatin (LIPITOR) 40 MG tablet Take 40 mg by mouth daily.   03/15/2015 at Unknown time  . benzonatate (TESSALON PERLES) 100 MG capsule Take 1 capsule (100 mg total) by mouth every 6 (six) hours as needed for cough. 30 capsule 0 03/15/2015 at Unknown time  . clopidogrel (PLAVIX) 75 MG tablet Take 75 mg by mouth daily.   03/15/2015 at Unknown time  . furosemide (LASIX) 20 MG tablet Take 20 mg by mouth daily.   03/15/2015 at Unknown time  . insulin lispro protamine-lispro (HUMALOG 75/25 MIX) (75-25) 100 UNIT/ML SUSP injection Inject 40 Units into the skin 2 (two) times daily.   03/15/2015 at Unknown time  . levofloxacin (LEVAQUIN) 750 MG tablet Take 750 mg by mouth daily.   03/15/2015 at Unknown time  . lisinopril (PRINIVIL,ZESTRIL) 5 MG tablet Take 5 mg by mouth daily.   03/15/2015 at Unknown time  . metoprolol succinate (TOPROL-XL) 25 MG 24 hr tablet Take  12.5 mg by mouth daily.   03/15/2015 at 0800  . Omega-3 Fatty Acids (FISH OIL) 1000 MG CAPS Take 1 capsule by mouth daily.   03/15/2015 at Unknown time  . vitamin B-12 (CYANOCOBALAMIN) 1000 MCG tablet Take 1,000 mcg by mouth daily.   03/15/2015 at Unknown time  . Vitamin D, Ergocalciferol, (DRISDOL) 50000 UNITS CAPS capsule Take 50,000 Units by mouth every 7 (seven) days. Pt takes on Monday.   03/13/2015 at unknown  . traMADol (ULTRAM) 50 MG tablet Take 1 tablet (50 mg total) by mouth every 8 (eight) hours as needed. (Patient not taking: Reported on 03/15/2015) 12 tablet 0 01/31/2015 at Unsure    Current medications: Current Facility-Administered Medications  Medication Dose Route Frequency Provider Last Rate Last Dose  . acidophilus (RISAQUAD) capsule 1 capsule  1 capsule Oral Daily Aldean Jewett, MD   1 capsule at 03/18/15 0830  . atorvastatin (LIPITOR) tablet 40 mg  40 mg Oral QHS Vaughan Basta, MD   40 mg at 03/17/15 2251  . benzonatate (TESSALON) capsule 100 mg  100 mg Oral Q6H PRN Vaughan Basta, MD   100 mg at 03/17/15 2256  . cefTRIAXone (ROCEPHIN)  1 g in dextrose 5 % 50 mL IVPB  1 g Intravenous Q24H Aldean Jewett, MD   1 g at 03/17/15 1404  . clopidogrel (PLAVIX) tablet 75 mg  75 mg Oral Daily Vaughan Basta, MD   75 mg at 03/18/15 0830  . feeding supplement (GLUCERNA SHAKE) (GLUCERNA SHAKE) liquid 237 mL  237 mL Oral TID WC Aldean Jewett, MD   237 mL at 03/18/15 0833  . heparin injection 5,000 Units  5,000 Units Subcutaneous 3 times per day Vaughan Basta, MD   5,000 Units at 03/18/15 0516  . insulin aspart (novoLOG) injection 0-20 Units  0-20 Units Subcutaneous TID WC Aldean Jewett, MD   4 Units at 03/18/15 0831  . insulin aspart (novoLOG) injection 0-5 Units  0-5 Units Subcutaneous QHS Aldean Jewett, MD   3 Units at 03/17/15 2251  . insulin aspart protamine- aspart (NOVOLOG MIX 70/30) injection 40 Units  40 Units Subcutaneous BID WC Aldean Jewett, MD   40 Units at 03/18/15 [phone]  . ipratropium-albuterol (DUONEB) 0.5-2.5 (3) MG/3ML nebulizer solution 3 mL  3 mL Nebulization TID Aldean Jewett, MD   3 mL at 03/18/15 0742  . metoprolol succinate (TOPROL-XL) 24 hr tablet 12.5 mg  12.5 mg Oral Daily Vaughan Basta, MD   25 mg at 03/18/15 0830  . predniSONE (DELTASONE) tablet 50 mg  50 mg Oral Q breakfast Aldean Jewett, MD   50 mg at 03/18/15 0830  . traMADol (ULTRAM) tablet 50 mg  50 mg Oral Q6H PRN Vaughan Basta, MD      . vitamin B-12 (CYANOCOBALAMIN) tablet 1,000 mcg  1,000 mcg Oral Daily Vaughan Basta, MD   1,000 mcg at 03/18/15 0830  . [START ON 03/20/2015] Vitamin D (Ergocalciferol) (DRISDOL) capsule 50,000 Units  50,000 Units Oral Q7 days Vaughan Basta, MD          Allergies: Allergies  Allergen Reactions  . Codeine Rash  . Eliquis [Apixaban] Rash and Other (See Comments)    Reaction:  Bleeding       Past Medical History: Past Medical History  Diagnosis Date  . Diabetes mellitus without complication   . Hypertension   . Coronary artery disease   . Cancer   . CHF (congestive heart failure)   . AICD (automatic cardioverter/defibrillator) present      Past Surgical History: Past Surgical History  Procedure Laterality Date  . Cardiac defibrillator placement Left   . Mastectomy Right   . Joint replacement Right     knee     Family History: Family History  Problem Relation Age of Onset  . Alzheimer's disease Mother   . Congestive Heart Failure Father      Social History: History   Social History  . Marital Status: Married    Spouse Name: N/A  . Number of Children: N/A  . Years of Education: N/A   Occupational History  . Not on file.   Social History Main Topics  . Smoking status: Never Smoker   . Smokeless tobacco: Never Used  . Alcohol Use: No  . Drug Use: No  . Sexual Activity: Not on file   Other Topics Concern  . Not on file   Social History  Narrative     Review of Systems: Gen: Denies any fever, chills,  HEENT: No visual complaints, No Epistaxis or Sore throat. No sinusitis.  CV: Denies chest pain, angina, palpitations, Resp: Reports + dyspnea with exercise, cough, sputum, wheezing, and  ain with coughing. GI: Denies vomiting blood, jaundice, and melena. GU : Denies urinary burning, blood in urine, urinary frequency,  MS: Denies joint pain, limitation of movement, and swelling, stiffness, low back pain, extremity pain.  Derm: Denies rash, itching, unhealing ulcers.  Psych: Denies depression, anxiety, memory loss, Heme: Denies bruising, bleeding, and enlarged lymph nodes. Neuro: No headache. No diplopia. No dysarthria. No dysphasia. No history of CVA.   Endocrine No DM. No Thyroid disease. .   Vital Signs: Blood pressure 120/55, pulse 82, temperature 97.8 F (36.6 C), temperature source Oral, resp. rate 18, height 5\' 3"  (1.6 m), weight 75.615 kg (166 lb 11.2 oz), SpO2 95 %.  Weight trends: Filed Weights   03/16/15 2127 03/17/15 0500 03/18/15 0500  Weight: 70.988 kg (156 lb 8 oz) 72.439 kg (159 lb 11.2 oz) 75.615 kg (166 lb 11.2 oz)    Physical Exam: General:  NAD,  Laying in bed  HEENT Anicteric, moist mucous membranes  Neck:  supple no masses  Lungs: Mild diffuse rhonchi, normal effort  Heart:  Regular, no rub or gallop  Abdomen: Soft nontender nondistended  Extremities:  no peripheral edema  Neurologic: Alert, Nonfocal  Skin: No acute rashes             Lab results: Basic Metabolic Panel:  Recent Labs Lab 03/16/15 0545 03/17/15 0324 03/18/15 0445  NA 130* 130* 136  K 3.9 4.8 3.9  CL 97* 97* 103  CO2 22 21* 24  GLUCOSE 419* 417* 181*  BUN 28* 48* 60*  CREATININE 1.41* 2.49* 2.24*  CALCIUM 8.1* 8.3* 8.0*    Liver Function Tests: No results for input(s): AST, ALT, ALKPHOS, BILITOT, PROT, ALBUMIN in the last 168 hours. No results for input(s): LIPASE, AMYLASE in the last 168  hours. No results for input(s): AMMONIA in the last 168 hours.  CBC:  Recent Labs Lab 03/15/15 1620  03/17/15 0324 03/18/15 0445  WBC 14.3*  < > 25.2* 20.9*  NEUTROABS 12.5*  --   --   --   HGB 9.4*  < > 9.8* 9.3*  HCT 29.2*  < > 29.3* 27.4*  MCV 88.8  < > 89.3 87.0  PLT 180  < > 210 239  < > = values in this interval not displayed.  Cardiac Enzymes:  Recent Labs Lab 03/15/15 2131  TROPONINI 0.04*    BNP: Invalid input(s): POCBNP  CBG:  Recent Labs Lab 03/17/15 1305 03/17/15 1612 03/17/15 2236 03/18/15 0755 03/18/15 1151  GLUCAP 390* 297* 258* 179* 142*    Microbiology: Results for orders placed or performed during the hospital encounter of 03/15/15  Blood culture (routine x 2)     Status: None (Preliminary result)   Collection Time: 03/15/15  4:20 PM  Result Value Ref Range Status   Specimen Description BLOOD LEFT ARM  Final   Special Requests   Final    BOTTLES DRAWN AEROBIC AND ANAEROBIC 2CC AEROBIC,2CC ANAEROBIC   Culture NO GROWTH 3 DAYS  Final   Report Status PENDING  Incomplete  Blood culture (routine x 2)     Status: None (Preliminary result)   Collection Time: 03/15/15  4:20 PM  Result Value Ref Range Status   Specimen Description BLOOD LEFT HAND  Final   Special Requests   Final    BOTTLES DRAWN AEROBIC AND ANAEROBIC 2CC AEROBIC,2CC ANAEROBIC   Culture NO GROWTH 3 DAYS  Final   Report Status PENDING  Incomplete  C difficile quick scan w PCR reflex  Status: None   Collection Time: 03/17/15  7:41 AM  Result Value Ref Range Status   C Diff antigen NEGATIVE NEGATIVE Final   C Diff toxin NEGATIVE NEGATIVE Final   C Diff interpretation Negative for C. difficile  Final    Coagulation Studies:  Recent Labs  03/18/15 0445  LABPROT 16.7*  INR 1.33    Urinalysis:  Recent Labs  03/15/15 2313  COLORURINE YELLOW*  LABSPEC 1.035*  PHURINE 5.0  GLUCOSEU >500*  HGBUR 1+*  BILIRUBINUR NEGATIVE  KETONESUR NEGATIVE  PROTEINUR 30*   NITRITE NEGATIVE  LEUKOCYTESUR 3+*      Imaging:  No results found.   Assessment & Plan: Pt is a 81 y.o. yo female with a PMHX of hypertension, diabetes, coronary disease, breast cancer metastatic, AICD, congestive heart failure, was admitted to [facility] on 03/15/2015 with shortness of breath  1. Acute renal failure.  Likely secondary to IV contrast exposure.  Serum creatinine has started to improve.  Today's creatinine is 2.24. Patient is currently also being treated for acute bronchitis with IV Rocephin and steroids. Those medications for creatinine clearance less than 30 2.  Chronic kidney disease stage III.  baseline creatinine 1.4/GFR 34 Multiple factors probably contributing to chronic kidney disease including atherosclerosis, diabetes, hypertension  Will follow

## 2015-03-18 NOTE — Plan of Care (Signed)
Problem: Discharge Progression Outcomes Goal: Other Discharge Outcomes/Goals Pt remains on High Fall Risks; POCT results decreased throughout the shift did not given scheduled 70/30 40 units at dinner; pt scheduled for PET Scan and biopsy on 03/20/15;

## 2015-03-19 LAB — CBC
HCT: 29.5 % — ABNORMAL LOW (ref 35.0–47.0)
Hemoglobin: 10.1 g/dL — ABNORMAL LOW (ref 12.0–16.0)
MCH: 29.8 pg (ref 26.0–34.0)
MCHC: 34.2 g/dL (ref 32.0–36.0)
MCV: 87.2 fL (ref 80.0–100.0)
PLATELETS: 245 10*3/uL (ref 150–440)
RBC: 3.38 MIL/uL — ABNORMAL LOW (ref 3.80–5.20)
RDW: 18 % — ABNORMAL HIGH (ref 11.5–14.5)
WBC: 15.3 10*3/uL — ABNORMAL HIGH (ref 3.6–11.0)

## 2015-03-19 LAB — GLUCOSE, CAPILLARY
GLUCOSE-CAPILLARY: 202 mg/dL — AB (ref 65–99)
GLUCOSE-CAPILLARY: 308 mg/dL — AB (ref 65–99)
GLUCOSE-CAPILLARY: 206 mg/dL — AB (ref 65–99)
Glucose-Capillary: 236 mg/dL — ABNORMAL HIGH (ref 65–99)

## 2015-03-19 LAB — BASIC METABOLIC PANEL
ANION GAP: 8 (ref 5–15)
BUN: 51 mg/dL — ABNORMAL HIGH (ref 6–20)
CHLORIDE: 104 mmol/L (ref 101–111)
CO2: 26 mmol/L (ref 22–32)
CREATININE: 1.51 mg/dL — AB (ref 0.44–1.00)
Calcium: 8.3 mg/dL — ABNORMAL LOW (ref 8.9–10.3)
GFR, EST AFRICAN AMERICAN: 36 mL/min — AB (ref >60)
GFR, EST NON AFRICAN AMERICAN: 31 mL/min — AB (ref >60)
Glucose, Bld: 213 mg/dL — ABNORMAL HIGH (ref 65–99)
POTASSIUM: 4.5 mmol/L (ref 3.5–5.1)
Sodium: 138 mmol/L (ref 135–145)

## 2015-03-19 LAB — VANCOMYCIN, TROUGH: VANCOMYCIN TR: 7 ug/mL — AB (ref 10–20)

## 2015-03-19 LAB — CANCER ANTIGEN 27.29: CA 27.29: 186.9 U/mL — ABNORMAL HIGH (ref 0.0–38.6)

## 2015-03-19 MED ORDER — PREDNISONE 20 MG PO TABS
20.0000 mg | ORAL_TABLET | Freq: Every day | ORAL | Status: DC
  Administered 2015-03-20 – 2015-03-21 (×2): 20 mg via ORAL
  Filled 2015-03-19 (×2): qty 1

## 2015-03-19 MED ORDER — GUAIFENESIN-DM 100-10 MG/5ML PO SYRP
5.0000 mL | ORAL_SOLUTION | ORAL | Status: DC | PRN
  Administered 2015-03-19: 5 mL via ORAL
  Filled 2015-03-19: qty 5

## 2015-03-19 NOTE — Progress Notes (Signed)
[place] [facility] [place] NAME: Lauren Best    MR#:  [id number]  DATE OF BIRTH:  1933-05-24  SUBJECTIVE:  CHIEF COMPLAINT:   Chief Complaint  Patient presents with  . Cough   Feeling much better, still with coughing spells, otherwise no complaints.  REVIEW OF SYSTEMS:   Review of Systems  Constitutional: Negative for fever.  Respiratory: Negative for shortness of breath.   Cardiovascular: Negative for chest pain and palpitations.  Gastrointestinal: Negative for nausea, vomiting and abdominal pain.  Genitourinary: Negative for dysuria.  Psychiatric/Behavioral: The patient is nervous/anxious.     DRUG ALLERGIES:   Allergies  Allergen Reactions  . Codeine Rash  . Eliquis [Apixaban] Rash and Other (See Comments)    Reaction:  Bleeding     VITALS:  Blood pressure 122/50, pulse 82, temperature 97.6 F (36.4 C), temperature source Oral, resp. rate 18, height 5\' 3"  (1.6 m), weight 73.483 kg (162 lb), SpO2 95 %.  PHYSICAL EXAMINATION:  GENERAL:  81 y.o.-year-old patient lying in the bed, eating lunch LUNGS: Anterior examination, lungs clear to auscultation bilaterally, no dyspnea, good air movement CARDIOVASCULAR: S1, S2 normal. No murmurs, rubs, or gallops.  ABDOMEN: Soft, nontender, nondistended. Bowel sounds present. No organomegaly or mass.  EXTREMITIES: No pedal edema, cyanosis, or clubbing.  NEUROLOGIC: Cranial nerves II through XII are intact. Muscle strength 5/5 in all extremities. Sensation intact. Gait not checked.  PSYCHIATRIC: The patient is alert and oriented x 3. Anxious SKIN: No obvious rash, lesion, or ulcer.    LABORATORY PANEL:   CBC  Recent Labs Lab 03/19/15 0431  WBC 15.3*  HGB 10.1*  HCT 29.5*  PLT 245   ------------------------------------------------------------------------------------------------------------------  Chemistries   Recent Labs Lab 03/19/15 0431  NA 138  K 4.5  CL 104   CO2 26  GLUCOSE 213*  BUN 51*  CREATININE 1.51*  CALCIUM 8.3*   ------------------------------------------------------------------------------------------------------------------  Cardiac Enzymes  Recent Labs Lab 03/15/15 2131  TROPONINI 0.04*   ------------------------------------------------------------------------------------------------------------------  RADIOLOGY:  No results found.  EKG:   Orders placed or performed during the hospital encounter of 03/15/15  . ED EKG  . ED EKG  . EKG 12-Lead  . EKG 12-Lead  . EKG    ASSESSMENT AND PLAN:   #1 acute bronchitis/reactive airway: Improving, now breathing comfortably, still with episodes of coughing - Possibly due to lymphangitic spread of cancer versus reactive airway - Continue prednisone taper dose, nebulizers, continue Rocephin (she did complete a full course of Levaquin as an outpatient)  #2 nodules in the lung and liver - Oncology following. Agree with plan for tumor markers, liver biopsy, PET scan.  #3 diabetes mellitus type 2 uncontrolled: CBGs improving - A1c is 9.2, poor control at home - CBG's have been very elevated due to Solu-Medrol and also missing her dose of 7030 yesterday morning - Hydrate, resume 70/30, continue sliding scale resistant scale, carbohydrate modified diet  #4 coronary artery disease, congestive heart failure/AICD in place - Ejection fraction is 30% by echo February 2016 - No chest pain or EKG change, troponins have been stable this is not ACS - No edema or other sign of congestive heart failure exacerbation at this time - Continue home regimen including aspirin, statin, Plavix, hold Plavix for today due to renal injury - Continue with strict I's and O's low-salt diet  #5 acute on chronic kidney failure stage III: improved - Creatinine continues to escalate despite hydration. This is likely due to  hyperglycemia, steroid, dehydration, CT contrast on admission. - appreciate  nephrology consultation   CODE STATUS: Full   TOTAL TIME TAKING CARE OF THIS PATIENT: 20 minutes.  Greater than 50% of time spent in care coordination and counseling. Spoke with patient, her husband and nursing. POSSIBLE D/C IN 2-3 DEPENDING ON CLINICAL CONDITION.   Myrtis Ser M.D on 03/19/2015 at 11:03 AM  Between 7am to 6pm - Pager - [phone]  After 6pm go to [email or web address] - password [email or web address]  Office  [phone]  CC: Primary care physician; Dion Body, MD

## 2015-03-19 NOTE — Plan of Care (Signed)
Problem: Discharge Progression Outcomes Goal: Other Discharge Outcomes/Goals Outcome: Progressing Pt A&O; remains on High Fall Risks; pt will be NPO after midnight tonight for a PET Scan and a biopsy on 03/20/15 (2 separate procedures), pt aware that her POCT should be less than 200 in am; pt refuses to wear SCDs

## 2015-03-19 NOTE — Progress Notes (Signed)
Subjective:   Overall doing better. Serum creatinine improved down to 1.51. Coughing also appears to be slightly improved. No other acute complaints.  Objective:  Vital signs in last 24 hours:  Temp:  [97.6 F (36.4 C)-98.5 F (36.9 C)] 97.6 F (36.4 C) (08/07 0444) Pulse Rate:  [77-89] 82 (08/07 0444) Resp:  [18] 18 (08/07 0444) BP: (121-130)/(47-52) 122/50 mmHg (08/07 0444) SpO2:  [91 %-95 %] 95 % (08/07 0737) Weight:  [73.483 kg (162 lb)] 73.483 kg (162 lb) (08/07 0500)  Weight change: -2.132 kg (-4 lb 11.2 oz) Filed Weights   03/17/15 0500 03/18/15 0500 03/19/15 0500  Weight: 72.439 kg (159 lb 11.2 oz) 75.615 kg (166 lb 11.2 oz) 73.483 kg (162 lb)    Intake/Output: I/O last 3 completed shifts: In: 2197 [P.O.:360; I.V.:1837] Out: 1700 [Urine:1700]     Physical Exam: General:  NAD, laying in the bed   HEENT  anicteric, moist mucous membranes   Neck  supple, no masses   Pulm/lungs  decreased breath sounds at bases, normal effort today, no wheezing crackles   CVS/Heart  no rub or gallop   Abdomen:   soft, nontender   Extremities:  no significant Edema   Neurologic:  alert, oriented   Skin:  no acute rashes   Access:        Basic Metabolic Panel:  Recent Labs Lab 03/15/15 1620 03/16/15 0545 03/17/15 0324 03/18/15 0445 03/19/15 0431  NA 128* 130* 130* 136 138  K 4.3 3.9 4.8 3.9 4.5  CL 94* 97* 97* 103 104  CO2 22 22 21* 24 26  GLUCOSE 495* 419* 417* 181* 213*  BUN 26* 28* 48* 60* 51*  CREATININE 1.52* 1.41* 2.49* 2.24* 1.51*  CALCIUM 8.1* 8.1* 8.3* 8.0* 8.3*     CBC:  Recent Labs Lab 03/15/15 1620 03/16/15 0545 03/17/15 0324 03/18/15 0445 03/19/15 0431  WBC 14.3* 12.5* 25.2* 20.9* 15.3*  NEUTROABS 12.5*  --   --   --   --   HGB 9.4* 9.8* 9.8* 9.3* 10.1*  HCT 29.2* 30.0* 29.3* 27.4* 29.5*  MCV 88.8 88.4 89.3 87.0 87.2  PLT 180 183 210 239 245      Microbiology: Results for orders placed or performed during the hospital encounter of  03/15/15  Blood culture (routine x 2)     Status: None (Preliminary result)   Collection Time: 03/15/15  4:20 PM  Result Value Ref Range Status   Specimen Description BLOOD LEFT ARM  Final   Special Requests   Final    BOTTLES DRAWN AEROBIC AND ANAEROBIC 2CC AEROBIC,2CC ANAEROBIC   Culture NO GROWTH 3 DAYS  Final   Report Status PENDING  Incomplete  Blood culture (routine x 2)     Status: None (Preliminary result)   Collection Time: 03/15/15  4:20 PM  Result Value Ref Range Status   Specimen Description BLOOD LEFT HAND  Final   Special Requests   Final    BOTTLES DRAWN AEROBIC AND ANAEROBIC 2CC AEROBIC,2CC ANAEROBIC   Culture NO GROWTH 3 DAYS  Final   Report Status PENDING  Incomplete  C difficile quick scan w PCR reflex     Status: None   Collection Time: 03/17/15  7:41 AM  Result Value Ref Range Status   C Diff antigen NEGATIVE NEGATIVE Final   C Diff toxin NEGATIVE NEGATIVE Final   C Diff interpretation Negative for C. difficile  Final    Coagulation Studies:  Recent Labs  03/18/15 0445  LABPROT  16.7*  INR 1.33    Urinalysis: No results for input(s): COLORURINE, LABSPEC, PHURINE, GLUCOSEU, HGBUR, BILIRUBINUR, KETONESUR, PROTEINUR, UROBILINOGEN, NITRITE, LEUKOCYTESUR in the last 72 hours.  Invalid input(s): APPERANCEUR    Imaging: No results found.   Medications:     . acidophilus  1 capsule Oral Daily  . atorvastatin  40 mg Oral QHS  . cefTRIAXone (ROCEPHIN)  IV  1 g Intravenous Q24H  . clopidogrel  75 mg Oral Daily  . feeding supplement (GLUCERNA SHAKE)  237 mL Oral TID WC  . heparin  5,000 Units Subcutaneous 3 times per day  . insulin aspart  0-20 Units Subcutaneous TID WC  . insulin aspart  0-5 Units Subcutaneous QHS  . insulin aspart protamine- aspart  40 Units Subcutaneous BID WC  . ipratropium-albuterol  3 mL Nebulization TID  . metoprolol succinate  12.5 mg Oral Daily  . [START ON 03/20/2015] predniSONE  20 mg Oral Q breakfast  . vitamin B-12  1,000  mcg Oral Daily  . [START ON 03/20/2015] Vitamin D (Ergocalciferol)  50,000 Units Oral Q7 days   benzonatate, guaiFENesin-dextromethorphan, traMADol  Assessment/ Plan:  81 y.o. female with a PMHX of hypertension, diabetes, coronary disease, breast cancer metastatic, AICD, congestive heart failure, was admitted to [facility] on 03/15/2015 with shortness of breath  1. Acute renal failure. Likely secondary to IV contrast exposure. Serum creatinine has started to improve. Today's creatinine is down to 1.51 Patient is currently also being treated for acute bronchitis with IV Rocephin and steroids. Dose medications for creatinine clearance less than 30 2. Chronic kidney disease stage III. baseline creatinine 1.4/GFR 34 Multiple factors probably contributing to chronic kidney disease including atherosclerosis, diabetes, hypertension  Will follow   LOS: 4 Lauren Best 8/7/201611:33 AM

## 2015-03-19 NOTE — Plan of Care (Signed)
Problem: Discharge Progression Outcomes Goal: Other Discharge Outcomes/Goals Plan of Care Progress to Goal:  Pt c/o cough,t essalon pearls given per orders with relief until early am, Dr. Jannifer Franklin notified of increased cough and robitussin DM given per orders.  Pt without c/o pain, rested at intervals.  Pt for PET scan and biopsy on Monday 03-20-15

## 2015-03-20 ENCOUNTER — Other Ambulatory Visit: Payer: Medicare Other

## 2015-03-20 ENCOUNTER — Telehealth: Payer: Self-pay | Admitting: *Deleted

## 2015-03-20 DIAGNOSIS — R160 Hepatomegaly, not elsewhere classified: Secondary | ICD-10-CM | POA: Diagnosis present

## 2015-03-20 DIAGNOSIS — E119 Type 2 diabetes mellitus without complications: Secondary | ICD-10-CM

## 2015-03-20 DIAGNOSIS — C787 Secondary malignant neoplasm of liver and intrahepatic bile duct: Secondary | ICD-10-CM | POA: Diagnosis present

## 2015-03-20 DIAGNOSIS — C7800 Secondary malignant neoplasm of unspecified lung: Secondary | ICD-10-CM | POA: Diagnosis present

## 2015-03-20 LAB — BASIC METABOLIC PANEL
ANION GAP: 5 (ref 5–15)
BUN: 47 mg/dL — ABNORMAL HIGH (ref 6–20)
CO2: 26 mmol/L (ref 22–32)
Calcium: 8.2 mg/dL — ABNORMAL LOW (ref 8.9–10.3)
Chloride: 108 mmol/L (ref 101–111)
Creatinine, Ser: 1.11 mg/dL — ABNORMAL HIGH (ref 0.44–1.00)
GFR calc non Af Amer: 46 mL/min — ABNORMAL LOW (ref >60)
GFR, EST AFRICAN AMERICAN: 53 mL/min — AB (ref >60)
Glucose, Bld: 110 mg/dL — ABNORMAL HIGH (ref 65–99)
POTASSIUM: 4.6 mmol/L (ref 3.5–5.1)
Sodium: 139 mmol/L (ref 135–145)

## 2015-03-20 LAB — CULTURE, BLOOD (ROUTINE X 2)
CULTURE: NO GROWTH
Culture: NO GROWTH

## 2015-03-20 LAB — CBC
HCT: 28.7 % — ABNORMAL LOW (ref 35.0–47.0)
Hemoglobin: 9.7 g/dL — ABNORMAL LOW (ref 12.0–16.0)
MCH: 29.5 pg (ref 26.0–34.0)
MCHC: 33.7 g/dL (ref 32.0–36.0)
MCV: 87.5 fL (ref 80.0–100.0)
Platelets: 249 10*3/uL (ref 150–440)
RBC: 3.28 MIL/uL — ABNORMAL LOW (ref 3.80–5.20)
RDW: 17.7 % — ABNORMAL HIGH (ref 11.5–14.5)
WBC: 16.1 10*3/uL — AB (ref 3.6–11.0)

## 2015-03-20 LAB — GLUCOSE, CAPILLARY
Glucose-Capillary: 84 mg/dL (ref 65–99)
Glucose-Capillary: 86 mg/dL (ref 65–99)
Glucose-Capillary: 131 mg/dL — ABNORMAL HIGH (ref 65–99)
Glucose-Capillary: 110 mg/dL — ABNORMAL HIGH (ref 65–99)

## 2015-03-20 LAB — PROTIME-INR
INR: 1.15
PROTHROMBIN TIME: 14.9 s (ref 11.4–15.0)

## 2015-03-20 LAB — APTT: aPTT: 29 seconds (ref 24–36)

## 2015-03-20 LAB — PROTEIN / CREATININE RATIO, URINE
Creatinine, Urine: 37 mg/dL
Protein Creatinine Ratio: 0.62 mg/mg{Cre} — ABNORMAL HIGH (ref 0.00–0.15)
TOTAL PROTEIN, URINE: 23 mg/dL

## 2015-03-20 MED ORDER — GUAIFENESIN-DM 100-10 MG/5ML PO SYRP: 5.0000 mL | ORAL_SOLUTION | ORAL | Status: AC | PRN

## 2015-03-20 MED ORDER — PREDNISONE 20 MG PO TABS: 20.0000 mg | ORAL_TABLET | Freq: Every day | ORAL | Status: DC

## 2015-03-20 NOTE — Plan of Care (Signed)
Problem: Discharge Progression Outcomes Goal: Other Discharge Outcomes/Goals Outcome: Progressing Plan of Care Progress to Goal:  Pt unable to have U/S guided biopsy today b/c she rec'd plavix yesterday.  Soonest she can have procedure is Friday - can be done outpt.  Pt was also supposed to have PET scan today - but couldn't b/c her blood sugar hasn't been under 200 for 24 hours.  BS has been good today and she's scheduled to have PET tomorrow at approx 9:30.  Pt ambulated 1/4 of nurse's stn and did really well.  Pt needs to walk to BR instead of using bedpan. Would suggest asking dr for PT consult. Will probably go home after PET tomorrow.

## 2015-03-20 NOTE — Telephone Encounter (Signed)
Radiology unable to do Liver bx due to her taking Plavix and having to be off of it for 5 days. The soonest it can be done is Friday and she is being discharged today form hospital. They need an order placed from you to schedule biopsy as an outpatient

## 2015-03-20 NOTE — Plan of Care (Signed)
Problem: Discharge Progression Outcomes Goal: Other Discharge Outcomes/Goals Plan of care progress to goal: Dyspnea: no complaints this shift Activity: uses bedpan Hemodynamically: VSS Pt scheduled for US guided biopsy and PET scan today

## 2015-03-20 NOTE — Care Management Important Message (Signed)
Important Message  Patient Details  Name: Lauren Best MRN: [id number] Date of Birth: [birth year]   Medicare Important Message Given:  Yes-third notification given    Juliann Pulse A Allmond 03/20/2015, 10:56 AM

## 2015-03-20 NOTE — Discharge Summary (Signed)
[facility] NAME: Lauren Best    MR#:  [id number]  DATE OF BIRTH:  08-06-1933  DATE OF ADMISSION:  03/15/2015 ADMITTING PHYSICIAN: Vaughan Basta, MD  DATE OF DISCHARGE: 03/20/15  PRIMARY CARE PHYSICIAN: Dion Body, MD    ADMISSION DIAGNOSIS:  Cough [R05] SOB (shortness of breath) [R06.02] NSTEMI (non-ST elevated myocardial infarction) [I21.4] SIRS (systemic inflammatory response syndrome) [A41.9]  DISCHARGE DIAGNOSIS:  Active Problems:   Acute bronchitis   Metastatic cancer to lung   Metastatic cancer to liver   Diabetes   SECONDARY DIAGNOSIS:   Past Medical History  Diagnosis Date  . Diabetes mellitus without complication   . Hypertension   . Coronary artery disease   . Cancer   . CHF (congestive heart failure)   . AICD (automatic cardioverter/defibrillator) present     HOSPITAL COURSE:    #1 acute bronchitis/reactive airway: On presentation she complained of shortness of breath with dry cough. She had already completed a course of Levaquin as an outpatient prior to presentation. On admission she was treated with IV steroid, scheduled nebulizers and Rocephin IV. It is possible that her acute respiratory symptoms were due to lymphangitic spread of cancer versus a reactive airway disease. At the time of discharge she is breathing comfortably on room air with no supplemental oxygen or Carmen. She does have severe coughing spells. She will be discharged with 3 more days of prednisone 20 mg, cough suppression. She has completed 5 days of IV Rocephin. She shows no signs of persistent infection no fever.  #2 nodules in the lung and liver: Very likely this is recurrence of breast cancer with widespread metastases. She was followed by oncology inpatient. She will have a PET scan prior to discharge today and will follow-up with Dr. Rollene Rotunda did in clinic in 2-3 days. She will also have a  liver biopsy scheduled for Friday, August 12.  #3 diabetes mellitus type 2 uncontrolled: Blood sugars were difficult to control in hospital due to high-dose steroid as discussed above for #1. A1c is 9.2 she could use improved glycemic control at home. She is being discharged on her prior dose of 7030 with no changes made to the regimen. She should be in a carbohydrate modified diet. She can follow-up with her primary care provider.  #4 coronary artery disease, congestive heart failure/AICD in place: Ejection fraction is 30% by echo February 2016. She had no chest pain, EKG changes. Troponins negative. She did not show any signs of acute congestive heart failure exacerbation. She continues on her home regimen including a statin. She is holding Plavix in preparation for liver biopsy on Friday but should resume this medication after the biopsy.  #5 acute on chronic kidney failure stage III: In the hospital she did have an acute kidney injury likely due to steroid as well as CT contrast dye on admission. Renal functioning is improving and is almost back to normal. At this point it would be okay to resume lisinopril.  DISCHARGE CONDITIONS:   Stable  CONSULTS OBTAINED:  Treatment Team:  Forest Gleason, MD Aldean Jewett, MD Murlean Iba, MD  DRUG ALLERGIES:   Allergies  Allergen Reactions  . Codeine Rash  . Eliquis [Apixaban] Rash and Other (See Comments)    Reaction:  Bleeding     DISCHARGE MEDICATIONS:   Current Discharge Medication List    START taking these medications   Details  guaiFENesin-dextromethorphan (ROBITUSSIN DM) 100-10 MG/5ML  syrup Take 5 mLs by mouth every 4 (four) hours as needed for cough. Qty: 118 mL, Refills: 0    predniSONE (DELTASONE) 20 MG tablet Take 1 tablet (20 mg total) by mouth daily with breakfast. Qty: 3 tablet, Refills: 0      CONTINUE these medications which have NOT CHANGED   Details  albuterol (PROVENTIL HFA;VENTOLIN HFA) 108 (90 BASE) MCG/ACT  inhaler Inhale 2 puffs into the lungs every 6 (six) hours as needed for wheezing or shortness of breath. Qty: 1 Inhaler, Refills: 0    atorvastatin (LIPITOR) 40 MG tablet Take 40 mg by mouth daily.    benzonatate (TESSALON PERLES) 100 MG capsule Take 1 capsule (100 mg total) by mouth every 6 (six) hours as needed for cough. Qty: 30 capsule, Refills: 0    furosemide (LASIX) 20 MG tablet Take 20 mg by mouth daily.    insulin lispro protamine-lispro (HUMALOG 75/25 MIX) (75-25) 100 UNIT/ML SUSP injection Inject 40 Units into the skin 2 (two) times daily.    lisinopril (PRINIVIL,ZESTRIL) 5 MG tablet Take 5 mg by mouth daily.    metoprolol succinate (TOPROL-XL) 25 MG 24 hr tablet Take 12.5 mg by mouth daily.    Omega-3 Fatty Acids (FISH OIL) 1000 MG CAPS Take 1 capsule by mouth daily.    vitamin B-12 (CYANOCOBALAMIN) 1000 MCG tablet Take 1,000 mcg by mouth daily.    Vitamin D, Ergocalciferol, (DRISDOL) 50000 UNITS CAPS capsule Take 50,000 Units by mouth every 7 (seven) days. Pt takes on Monday.    traMADol (ULTRAM) 50 MG tablet Take 1 tablet (50 mg total) by mouth every 8 (eight) hours as needed. Qty: 12 tablet, Refills: 0      STOP taking these medications     clopidogrel (PLAVIX) 75 MG tablet      levofloxacin (LEVAQUIN) 750 MG tablet          DISCHARGE INSTRUCTIONS:   Stop taking Plavix for now. After liver biopsy on Friday you can restart this medication.   DIET:  Diabetic diet and Low fat, Low cholesterol diet  DISCHARGE CONDITION:  Stable  ACTIVITY:  Activity as tolerated  OXYGEN:  Home Oxygen: No.   Oxygen Delivery: room air  DISCHARGE LOCATION:  home   If you experience worsening of your admission symptoms, develop shortness of breath, life threatening emergency, suicidal or homicidal thoughts you must seek medical attention immediately by calling 911 or calling your MD immediately  if symptoms less severe.  You Must read complete instructions/literature  along with all the possible adverse reactions/side effects for all the Medicines you take and that have been prescribed to you. Take any new Medicines after you have completely understood and accpet all the possible adverse reactions/side effects.   Please note  You were cared for by a hospitalist during your hospital stay. If you have any questions about your discharge medications or the care you received while you were in the hospital after you are discharged, you can call the unit and asked to speak with the hospitalist on call if the hospitalist that took care of you is not available. Once you are discharged, your primary care physician will handle any further medical issues. Please note that NO REFILLS for any discharge medications will be authorized once you are discharged, as it is imperative that you return to your primary care physician (or establish a relationship with a primary care physician if you do not have one) for your aftercare needs so that they can reassess  your need for medications and monitor your lab values.    Today   CHIEF COMPLAINT:   Chief Complaint  Patient presents with  . Cough    HISTORY OF PRESENT ILLNESS:  Lauren Best is a 81 y.o. female with a known history of diabetes, hypertension, coronary artery disease, breast cancer, CHF, AICD she had complain of feeling short of breath so came to emergency room 10 days ago when onset CT scan she was found to have nodularities on the lung in in the liver, she was given antibiotics and sent home with advice to follow with her primary care physician. She had seen Dr. Jacquenette Shone her primary care after that, he thought it was bronchitis. And for the findings of nodules in the lung daily refer her to Dr. Ma Hillock in [place]. She continued to feel short of breath and have dry cough, without any fever or chest pain, and gradually getting more and more weak so decided to come to emergency room today. On repeat CT scan,  there are no findings of infiltrate.  VITAL SIGNS:  Blood pressure 130/65, pulse 71, temperature 97.6 F (36.4 C), temperature source Oral, resp. rate 18, height 5\' 3"  (1.6 m), weight 74.163 kg (163 lb 8 oz), SpO2 92 %.  I/O:   Intake/Output Summary (Last 24 hours) at 03/20/15 1049 Last data filed at 03/20/15 0251  Gross per 24 hour  Intake    403 ml  Output    900 ml  Net   -497 ml    PHYSICAL EXAMINATION:  GENERAL:  81 y.o.-year-old patient lying in the bed with no acute distress. Obese. Eating breakfast EYES: Pupils equal, round, reactive to light and accommodation. No scleral icterus. Extraocular muscles intact.  HEENT: Head atraumatic, normocephalic. Oropharynx and nasopharynx clear. Mucous membranes are moist NECK:  Supple, no jugular venous distention. No thyroid enlargement, no tenderness.  LUNGS: Normal breath sounds bilaterally, no wheezing, rales,rhonchi or crepitation. No use of accessory muscles of respiration. No respiratory distress CARDIOVASCULAR: S1, S2 normal. No murmurs, rubs, or gallops.  ABDOMEN: Soft, non-tender, non-distended. Bowel sounds present. No organomegaly or mass.  EXTREMITIES: No pedal edema, cyanosis, or clubbing.  NEUROLOGIC: Cranial nerves II through XII are intact. Muscle strength 5/5 in all extremities. Sensation intact. Gait not checked.  PSYCHIATRIC: The patient is alert and oriented x 3.  SKIN: No obvious rash, lesion, or ulcer.   DATA REVIEW:   CBC  Recent Labs Lab 03/20/15 0431  WBC 16.1*  HGB 9.7*  HCT 28.7*  PLT 249    Chemistries   Recent Labs Lab 03/20/15 0431  NA 139  K 4.6  CL 108  CO2 26  GLUCOSE 110*  BUN 47*  CREATININE 1.11*  CALCIUM 8.2*    Cardiac Enzymes  Recent Labs Lab 03/15/15 2131  TROPONINI 0.04*    Microbiology Results  Results for orders placed or performed during the hospital encounter of 03/15/15  Blood culture (routine x 2)     Status: None   Collection Time: 03/15/15  4:20 PM   Result Value Ref Range Status   Specimen Description BLOOD LEFT ARM  Final   Special Requests   Final    BOTTLES DRAWN AEROBIC AND ANAEROBIC 2CC AEROBIC,2CC ANAEROBIC   Culture NO GROWTH 5 DAYS  Final   Report Status 03/20/2015 FINAL  Final  Blood culture (routine x 2)     Status: None   Collection Time: 03/15/15  4:20 PM  Result Value Ref Range Status  Specimen Description BLOOD LEFT HAND  Final   Special Requests   Final    BOTTLES DRAWN AEROBIC AND ANAEROBIC 2CC AEROBIC,2CC ANAEROBIC   Culture NO GROWTH 5 DAYS  Final   Report Status 03/20/2015 FINAL  Final  C difficile quick scan w PCR reflex     Status: None   Collection Time: 03/17/15  7:41 AM  Result Value Ref Range Status   C Diff antigen NEGATIVE NEGATIVE Final   C Diff toxin NEGATIVE NEGATIVE Final   C Diff interpretation Negative for C. difficile  Final    RADIOLOGY:  No results found.  EKG:   Orders placed or performed during the hospital encounter of 03/15/15  . ED EKG  . ED EKG  . EKG 12-Lead  . EKG 12-Lead  . EKG      Management plans discussed with the patient, family and they are in agreement.  CODE STATUS:     Code Status Orders        Start     Ordered   03/15/15 2217  Full code   Continuous     03/15/15 2216      TOTAL TIME TAKING CARE OF THIS PATIENT: 35 minutes.  Greater than 50% of time spent in care coordination and counseling.  Myrtis Ser M.D on 03/20/2015 at 10:49 AM  Between 7am to 6pm - Pager - [phone]  After 6pm go to [email or web address] - password [email or web address]  Office  [phone]  CC: Primary care physician; Dion Body, MD

## 2015-03-21 ENCOUNTER — Inpatient Hospital Stay: Payer: Medicare Other

## 2015-03-21 ENCOUNTER — Other Ambulatory Visit: Payer: Self-pay | Admitting: *Deleted

## 2015-03-21 DIAGNOSIS — R932 Abnormal findings on diagnostic imaging of liver and biliary tract: Secondary | ICD-10-CM

## 2015-03-21 DIAGNOSIS — Z853 Personal history of malignant neoplasm of breast: Secondary | ICD-10-CM

## 2015-03-21 LAB — GLUCOSE, CAPILLARY
GLUCOSE-CAPILLARY: 59 mg/dL — AB (ref 65–99)
GLUCOSE-CAPILLARY: 55 mg/dL — AB (ref 65–99)
GLUCOSE-CAPILLARY: 136 mg/dL — AB (ref 65–99)
GLUCOSE-CAPILLARY: 199 mg/dL — AB (ref 65–99)
Glucose-Capillary: 55 mg/dL — ABNORMAL LOW (ref 65–99)
Glucose-Capillary: 57 mg/dL — ABNORMAL LOW (ref 65–99)
Glucose-Capillary: 53 mg/dL — ABNORMAL LOW (ref 65–99)
Glucose-Capillary: 116 mg/dL — ABNORMAL HIGH (ref 65–99)
Glucose-Capillary: 63 mg/dL — ABNORMAL LOW (ref 65–99)

## 2015-03-21 MED ORDER — INSULIN ASPART PROT & ASPART (70-30 MIX) 100 UNIT/ML ~~LOC~~ SUSP
10.0000 [IU] | Freq: Once | SUBCUTANEOUS | Status: AC
  Administered 2015-03-21: 12:00:00 10 [IU] via SUBCUTANEOUS
  Filled 2015-03-21: qty 10

## 2015-03-21 MED ORDER — LIVING WELL WITH DIABETES BOOK
Freq: Once | Status: AC
  Administered 2015-03-21: 11:00:00
  Filled 2015-03-21: qty 1

## 2015-03-21 MED ORDER — LIVING WELL WITH DIABETES BOOK: 1.0000 | Freq: Once | Status: AC

## 2015-03-21 NOTE — Progress Notes (Signed)
Inpatient Diabetes Program Recommendations  AACE/ADA: New Consensus Statement on Inpatient Glycemic Control (2013)  Target Ranges:  Prepandial:   less than 140 mg/dL      Peak postprandial:   less than 180 mg/dL (1-2 hours)      Critically ill patients:  140 - 180 mg/dL   Reason for Visit: low blood sugars  Diabetes history: Type 2  Outpatient Diabetes medications: 75/25 42 units bid (as stated by patient)  Please consider discontinuing current orders for Novolog correction insulin.    Consider 1/2 dose insulin this am - 20 units 70/30 insulin since patient has had multiple lows and resume 40 units 70/30 insulin this evening at supper.    ---Please instruct patient on the insulin needs on the day of PET scan.     Gentry Fitz, RN, BA, MHA, CDE Diabetes Coordinator Inpatient Diabetes Program  [phone] (Team Pager) [phone] ([place]) 03/21/2015 10:22 AM

## 2015-03-21 NOTE — Progress Notes (Signed)
Called Dr Volanda Napoleon about patient BS of 59, per PET Scan rep can not have any sugar before scan via any route, she said she wanted to try do do scan patient is non symptomatic per MD recheck sugar in 30 mins and call her back

## 2015-03-21 NOTE — Discharge Instructions (Signed)
Stop taking Plavix for now. You can restart it on the day after the procedure.  DIET:  Diabetic diet and Low fat, Low cholesterol diet  DISCHARGE CONDITION:  Stable  ACTIVITY:  Activity as tolerated  OXYGEN:  Home Oxygen: No.   Oxygen Delivery: room air  DISCHARGE LOCATION:  home   If you experience worsening of your admission symptoms, develop shortness of breath, life threatening emergency, suicidal or homicidal thoughts you must seek medical attention immediately by calling 911 or calling your MD immediately  if symptoms less severe.  You Must read complete instructions/literature along with all the possible adverse reactions/side effects for all the Medicines you take and that have been prescribed to you. Take any new Medicines after you have completely understood and accpet all the possible adverse reactions/side effects.   Please note  You were cared for by a hospitalist during your hospital stay. If you have any questions about your discharge medications or the care you received while you were in the hospital after you are discharged, you can call the unit and asked to speak with the hospitalist on call if the hospitalist that took care of you is not available. Once you are discharged, your primary care physician will handle any further medical issues. Please note that NO REFILLS for any discharge medications will be authorized once you are discharged, as it is imperative that you return to your primary care physician (or establish a relationship with a primary care physician if you do not have one) for your aftercare needs so that they can reassess your need for medications and monitor your lab values.

## 2015-03-21 NOTE — Plan of Care (Signed)
Problem: Discharge Progression Outcomes Goal: Other Discharge Outcomes/Goals Plan of care progress to goal: - No complaints of pain. - Scheduled for PET scan in am, NPO maintained Will continue to monitor.

## 2015-03-21 NOTE — Progress Notes (Signed)
Patient has no c/o pain this shift PET scan was canceled as patient BS was in the 50's and had to be treated VSS Patient discharged to home with husband with PET scan to be rescheduled on an outpatient basis

## 2015-03-22 ENCOUNTER — Ambulatory Visit: Payer: Medicare Other | Admitting: Internal Medicine

## 2015-03-24 ENCOUNTER — Ambulatory Visit: Admit: 2015-03-24 | Payer: Medicare Other

## 2015-03-24 ENCOUNTER — Other Ambulatory Visit: Payer: Self-pay | Admitting: Radiology

## 2015-03-27 ENCOUNTER — Ambulatory Visit
Admission: RE | Admit: 2015-03-27 | Discharge: 2015-03-27 | Disposition: A | Discharge status: Home / Self Care | Payer: Medicare Other | Source: Ambulatory Visit | Attending: Internal Medicine | Admitting: Internal Medicine

## 2015-03-27 DIAGNOSIS — R932 Abnormal findings on diagnostic imaging of liver and biliary tract: Secondary | ICD-10-CM | POA: Diagnosis present

## 2015-03-27 DIAGNOSIS — Z853 Personal history of malignant neoplasm of breast: Secondary | ICD-10-CM | POA: Diagnosis present

## 2015-03-27 DIAGNOSIS — C787 Secondary malignant neoplasm of liver and intrahepatic bile duct: Secondary | ICD-10-CM | POA: Insufficient documentation

## 2015-03-27 HISTORY — DX: Malignant neoplasm of unspecified site of unspecified female breast: C50.919

## 2015-03-27 HISTORY — DX: Reserved for inherently not codable concepts without codable children: IMO0001

## 2015-03-27 LAB — PROTIME-INR
INR: 1.09
PROTHROMBIN TIME: 14.3 s (ref 11.4–15.0)

## 2015-03-27 LAB — CBC
HCT: 34 % — ABNORMAL LOW (ref 35.0–47.0)
HEMOGLOBIN: 11.1 g/dL — AB (ref 12.0–16.0)
MCH: 28.8 pg (ref 26.0–34.0)
MCHC: 32.8 g/dL (ref 32.0–36.0)
MCV: 87.9 fL (ref 80.0–100.0)
Platelets: 257 10*3/uL (ref 150–440)
RBC: 3.86 MIL/uL (ref 3.80–5.20)
RDW: 18.3 % — ABNORMAL HIGH (ref 11.5–14.5)
WBC: 10.3 10*3/uL (ref 3.6–11.0)

## 2015-03-27 MED ORDER — SODIUM CHLORIDE 0.9 % IV SOLN
INTRAVENOUS | Status: DC
  Administered 2015-03-27: 12:00:00 via INTRAVENOUS

## 2015-03-27 NOTE — Progress Notes (Signed)
FSBS at 1100 am upon arrival-65.  Dr. Golden Circle notified.  Orange juice and graham crackers given.  FSBS at 1130-81.  pple juice given.

## 2015-03-27 NOTE — Procedures (Signed)
US liver biopsy  Complications:  None  Blood Loss: none  See dictation in canopy pacs

## 2015-03-28 ENCOUNTER — Inpatient Hospital Stay: Payer: Medicare Other | Attending: Internal Medicine | Admitting: Internal Medicine

## 2015-03-28 DIAGNOSIS — E119 Type 2 diabetes mellitus without complications: Secondary | ICD-10-CM | POA: Insufficient documentation

## 2015-03-28 DIAGNOSIS — R5383 Other fatigue: Secondary | ICD-10-CM | POA: Insufficient documentation

## 2015-03-28 DIAGNOSIS — I10 Essential (primary) hypertension: Secondary | ICD-10-CM | POA: Insufficient documentation

## 2015-03-28 DIAGNOSIS — I2510 Atherosclerotic heart disease of native coronary artery without angina pectoris: Secondary | ICD-10-CM | POA: Insufficient documentation

## 2015-03-28 DIAGNOSIS — C787 Secondary malignant neoplasm of liver and intrahepatic bile duct: Secondary | ICD-10-CM | POA: Insufficient documentation

## 2015-03-28 DIAGNOSIS — Z79818 Long term (current) use of other agents affecting estrogen receptors and estrogen levels: Secondary | ICD-10-CM | POA: Insufficient documentation

## 2015-03-28 DIAGNOSIS — C7800 Secondary malignant neoplasm of unspecified lung: Secondary | ICD-10-CM | POA: Insufficient documentation

## 2015-03-28 DIAGNOSIS — Z923 Personal history of irradiation: Secondary | ICD-10-CM | POA: Insufficient documentation

## 2015-03-28 DIAGNOSIS — Z9221 Personal history of antineoplastic chemotherapy: Secondary | ICD-10-CM | POA: Insufficient documentation

## 2015-03-28 DIAGNOSIS — Z794 Long term (current) use of insulin: Secondary | ICD-10-CM | POA: Insufficient documentation

## 2015-03-28 DIAGNOSIS — Z79899 Other long term (current) drug therapy: Secondary | ICD-10-CM | POA: Insufficient documentation

## 2015-03-28 DIAGNOSIS — R531 Weakness: Secondary | ICD-10-CM | POA: Insufficient documentation

## 2015-03-28 DIAGNOSIS — C50911 Malignant neoplasm of unspecified site of right female breast: Secondary | ICD-10-CM | POA: Insufficient documentation

## 2015-03-28 DIAGNOSIS — Z9013 Acquired absence of bilateral breasts and nipples: Secondary | ICD-10-CM | POA: Insufficient documentation

## 2015-03-30 ENCOUNTER — Inpatient Hospital Stay (HOSPITAL_BASED_OUTPATIENT_CLINIC_OR_DEPARTMENT_OTHER): Payer: Medicare Other | Admitting: Internal Medicine

## 2015-03-30 ENCOUNTER — Ambulatory Visit: Payer: Medicare Other | Admitting: Internal Medicine

## 2015-03-30 VITALS — BP 126/75 | HR 97 | Temp 98.5°F | Resp 18 | Wt 155.0 lb

## 2015-03-30 DIAGNOSIS — C7800 Secondary malignant neoplasm of unspecified lung: Secondary | ICD-10-CM | POA: Diagnosis not present

## 2015-03-30 DIAGNOSIS — Z794 Long term (current) use of insulin: Secondary | ICD-10-CM

## 2015-03-30 DIAGNOSIS — C50919 Malignant neoplasm of unspecified site of unspecified female breast: Secondary | ICD-10-CM | POA: Insufficient documentation

## 2015-03-30 DIAGNOSIS — C787 Secondary malignant neoplasm of liver and intrahepatic bile duct: Secondary | ICD-10-CM | POA: Diagnosis not present

## 2015-03-30 DIAGNOSIS — M179 Osteoarthritis of knee, unspecified: Secondary | ICD-10-CM | POA: Insufficient documentation

## 2015-03-30 DIAGNOSIS — Z923 Personal history of irradiation: Secondary | ICD-10-CM

## 2015-03-30 DIAGNOSIS — Z9013 Acquired absence of bilateral breasts and nipples: Secondary | ICD-10-CM | POA: Diagnosis not present

## 2015-03-30 DIAGNOSIS — E559 Vitamin D deficiency, unspecified: Secondary | ICD-10-CM | POA: Insufficient documentation

## 2015-03-30 DIAGNOSIS — Z9221 Personal history of antineoplastic chemotherapy: Secondary | ICD-10-CM | POA: Diagnosis not present

## 2015-03-30 DIAGNOSIS — M1710 Unilateral primary osteoarthritis, unspecified knee: Secondary | ICD-10-CM | POA: Insufficient documentation

## 2015-03-30 DIAGNOSIS — Z79818 Long term (current) use of other agents affecting estrogen receptors and estrogen levels: Secondary | ICD-10-CM

## 2015-03-30 DIAGNOSIS — C50911 Malignant neoplasm of unspecified site of right female breast: Secondary | ICD-10-CM | POA: Diagnosis not present

## 2015-03-30 DIAGNOSIS — R5383 Other fatigue: Secondary | ICD-10-CM | POA: Diagnosis not present

## 2015-03-30 DIAGNOSIS — I10 Essential (primary) hypertension: Secondary | ICD-10-CM

## 2015-03-30 DIAGNOSIS — Z79899 Other long term (current) drug therapy: Secondary | ICD-10-CM

## 2015-03-30 DIAGNOSIS — I2510 Atherosclerotic heart disease of native coronary artery without angina pectoris: Secondary | ICD-10-CM | POA: Diagnosis not present

## 2015-03-30 DIAGNOSIS — R531 Weakness: Secondary | ICD-10-CM | POA: Diagnosis not present

## 2015-03-30 DIAGNOSIS — E119 Type 2 diabetes mellitus without complications: Secondary | ICD-10-CM

## 2015-03-30 DIAGNOSIS — M8580 Other specified disorders of bone density and structure, unspecified site: Secondary | ICD-10-CM | POA: Insufficient documentation

## 2015-03-30 DIAGNOSIS — D649 Anemia, unspecified: Secondary | ICD-10-CM | POA: Insufficient documentation

## 2015-03-30 DIAGNOSIS — I429 Cardiomyopathy, unspecified: Secondary | ICD-10-CM | POA: Insufficient documentation

## 2015-03-30 MED ORDER — PSEUDOEPHEDRINE-CODEINE-GG 30-10-100 MG/5ML PO SOLN
ORAL | Status: DC
Start: 2015-03-30 — End: 2015-04-18

## 2015-03-30 NOTE — Progress Notes (Signed)
[facility]  [phone] [phone]     ID: Lauren Best OB: 03/13/1933  MR#: [id number]  YQM#:[id number]  Patient Care Team: Dion Body, MD as PCP - General (Family Medicine)  CHIEF COMPLAINT/DIAGNOSIS:  Stage IV metastatic breast cancer with liver and lung metastasis, diagnosed by ultrasound-guided liver biopsy on 03/27/15 (Pathology: DIAGNOSIS: A. LIVER, LEFT LOBE; BIOPSY: METASTATIC MAMMARY CARCINOMA, SEE COMMENT. Comment:  The patient has a known history of breast cancer and the current findings of liver and lung lesions are noted. A limited panel of immunohistochemical stains was performed to determine the site of origin. The tumor shows diffuse intense nuclear immunoreactivity for GATA3. TTF-1 is negative. The morphologic and immunophenotypic features are consistent with a breast primary. The control slides stain appropriately. ER, PR, and HER-2/neu IHC is pending). [Previously in 2009, had T3/T4, N1, M0 stage IIIB 5-6 cm right breast invasive ductal carcinoma. PET Scan showed uptake in the site of primary breast tumor and also medially under the first/second rib, possibly internal mammary lymph node area. ER and PR positive (both >90%). HER-2/neu 2+, NEGATIVE on gene amplification. Received neoadjuvant chemotherapy - completed Adriamycin/Cytoxan x 4, then Taxol weekly x8 doses (discontinued due to severe neuropathy). April 04, 2008 - RIGHT BREAST, MODIFIED RADICAL MASTECTOMY: INVASIVE DUCTAL CARCINOMA, 3.6 CM. METASTATIC CARCINOMA IN 2 OF 11 AXILLARY LYMPH NODES, LARGER MEASURES 0.9 CM, WITH EXTRANODAL EXTENSION.  Then completed radiation. Started hormonal therapy with Femara November 2009.]   HISTORY OF PRESENT ILLNESS:  Patient returns for continued oncology followup, she was recently hospitalized with possible respiratory tract infection and on radiological workup was found to have multiple liver masses and lung lesions suspicious for metastatic  disease. She had ultrasound-guided liver biopsy done, reports consistent with metastatic breast cancer, ER/PR/HER-2/neu pending. Patient clinically states that she continues to have cough with scanty phlegm, she has minor epistaxis when she blowsotherwise denies other bleeding symptoms. She has chronic fatigue and weakness and ambulates short distances inside home. Denies any dyspnea at rest, orthopnea or PND. Chronic arthritis symptoms in hands is same, otherwise has intermittent back pain.  No fever or chills.  REVIEW OF SYSTEMS:   ROS As in HPI above. In addition, no night sweats. No new headaches or focal weakness.  No new mood disturbances. No hemoptysis or chest pain. No dizziness or palpitation. No abdominal pain, constipation, diarrhea, dysuria or hematuria. No new skin rash. Has chronic paresthesias in extremities. PS ECOG 2  PAST MEDICAL HISTORY: Reviewed. Past Medical History  Diagnosis Date  . Diabetes mellitus without complication   . Hypertension   . Coronary artery disease   . Cancer   . CHF (congestive heart failure)   . AICD (automatic cardioverter/defibrillator) present   . Shortness of breath dyspnea   . Breast cancer right mastectomy    PAST SURGICAL HISTORY: Reviewed. Past Surgical History  Procedure Laterality Date  . Cardiac defibrillator placement Left   . Mastectomy Right   . Joint replacement Right     knee    FAMILY HISTORY: Reviewed. Family History  Problem Relation Age of Onset  . Alzheimer's disease Mother   . Congestive Heart Failure Father   Denies history of breast or ovarian cancer in the family.  SOCIAL HISTORY: Reviewed. Social History  Substance Use Topics  . Smoking status: Never Smoker   . Smokeless tobacco: Never Used  . Alcohol Use: No    Allergies  Allergen Reactions  . Metformin Nausea And Vomiting  . Codeine  Rash  . Eliquis [Apixaban] Rash and Other (See Comments)    Reaction:  Bleeding     Current Outpatient Prescriptions   Medication Sig Dispense Refill  . albuterol (PROVENTIL HFA;VENTOLIN HFA) 108 (90 BASE) MCG/ACT inhaler Inhale 2 puffs into the lungs every 6 (six) hours as needed for wheezing or shortness of breath. 1 Inhaler 0  . atorvastatin (LIPITOR) 40 MG tablet Take 40 mg by mouth daily.    . benzonatate (TESSALON PERLES) 100 MG capsule Take 1 capsule (100 mg total) by mouth every 6 (six) hours as needed for cough. 30 capsule 0  . furosemide (LASIX) 20 MG tablet Take 20 mg by mouth daily.    Marland Kitchen guaiFENesin-dextromethorphan (ROBITUSSIN DM) 100-10 MG/5ML syrup Take 5 mLs by mouth every 4 (four) hours as needed for cough. 118 mL 0  . Insulin Lispro Prot & Lispro (HUMALOG 75/25 MIX) (75-25) 100 UNIT/ML Kwikpen Inject into the skin.    Marland Kitchen lisinopril (PRINIVIL,ZESTRIL) 5 MG tablet Take 5 mg by mouth daily.    Marland Kitchen living well with diabetes book MISC 1 each by Does not apply route once. 1 each 0  . metoprolol succinate (TOPROL-XL) 25 MG 24 hr tablet Take 12.5 mg by mouth daily.    . Omega-3 Fatty Acids (FISH OIL) 1000 MG CAPS Take 1 capsule by mouth daily.    . traMADol (ULTRAM) 50 MG tablet Take 1 tablet (50 mg total) by mouth every 8 (eight) hours as needed. 12 tablet 0  . vitamin B-12 (CYANOCOBALAMIN) 1000 MCG tablet Take 1,000 mcg by mouth daily.    . Vitamin D, Ergocalciferol, (DRISDOL) 50000 UNITS CAPS capsule Take 50,000 Units by mouth every 7 (seven) days. Pt takes on Monday.    . pseudoephedrine-codeine-guaifenesin (CHERATUSSIN DAC) 30-10-100 MG/5ML solution Take 5 - 10 mL orally 4 times a day as needed for cough. 240 mL 0   No current facility-administered medications for this visit.    PHYSICAL EXAM: Filed Vitals:   03/30/15 1530  BP: 126/75  Pulse: 97  Temp: 98.5 F (36.9 C)  Resp: 18     Body mass index is 27.46 kg/(m^2).    ECOG FS:2 - Symptomatic, <50% confined to bed  GENERAL: Patient is overall weak-looking, otherwise alert and oriented and in no acute distress. There is no icterus. HEENT:  EOMs intact. Oral exam negative for thrush or lesions. No cervical lymphadenopathy. CVS: S1S2, regular LUNGS: Bilaterally good air entry, no rhonchi. ABDOMEN: Soft, nontender. Liver is just palpable  NEURO: grossly nonfocal, cranial nerves are intact.  EXTREMITIES: No pedal edema.  LAB RESULTS:    Component Value Date/Time   NA 139 03/20/2015 0431   NA 138 08/27/2014 0500   K 4.6 03/20/2015 0431   K 4.1 08/27/2014 0500   CL 108 03/20/2015 0431   CL 105 08/27/2014 0500   CO2 26 03/20/2015 0431   CO2 25 08/27/2014 0500   GLUCOSE 110* 03/20/2015 0431   GLUCOSE 63* 08/27/2014 0500   BUN 47* 03/20/2015 0431   BUN 23* 08/27/2014 0500   CREATININE 1.11* 03/20/2015 0431   CREATININE 1.21 08/27/2014 0500   CALCIUM 8.2* 03/20/2015 0431   CALCIUM 9.0 08/27/2014 0500   PROT 7.0 01/31/2015 1935   PROT 8.1 08/24/2014 1252   ALBUMIN 2.7* 01/31/2015 1935   ALBUMIN 2.9* 08/24/2014 1252   AST 126* 01/31/2015 1935   AST 45* 08/24/2014 1252   ALT 85* 01/31/2015 1935   ALT 38 08/24/2014 1252   ALKPHOS 193* 01/31/2015 1935  ALKPHOS 226* 08/24/2014 1252   BILITOT 0.8 01/31/2015 1935   BILITOT 0.4 08/24/2014 1252   GFRNONAA 46* 03/20/2015 0431   GFRNONAA 46* 08/27/2014 0500   GFRNONAA 50* 03/16/2014 0926   GFRAA 53* 03/20/2015 0431   GFRAA 55* 08/27/2014 0500   GFRAA 58* 03/16/2014 0926    Lab Results  Component Value Date   WBC 10.3 03/27/2015   NEUTROABS 12.5* 03/15/2015   HGB 11.1* 03/27/2015   HCT 34.0* 03/27/2015   MCV 87.9 03/27/2015   PLT 257 03/27/2015   03/18/15 - serum CA 27-29 is 186.9 (was 31.5 in August 2015, 28.9 in August 2014).  STUDIES: 03/15/15 - CT scan of the chest. IMPRESSION: 1. Negative for pulmonary embolus or acute aortic abnormality. 2. Atherosclerosis and coronary artery disease. 3. RIGHT mastectomy with radiation fibrosis in the anterior RIGHT upper lobe. 4. Mediastinal and hilar adenopathy with pleural nodularity, most compatible with lymphangitic spread of  tumor. Infiltration of the liver compatible with metastatic disease.  03/27/15 - Ultrasound-guided liver biopsy on 03/27/15 Pathology: DIAGNOSIS: A. LIVER, LEFT LOBE; BIOPSY: METASTATIC MAMMARY CARCINOMA, SEE COMMENT. Comment:  The patient has a known history of breast cancer and the current findings of liver and lung lesions are noted. A limited panel of immunohistochemical stains was performed to determine the site of origin. The tumor shows diffuse intense nuclear immunoreactivity for GATA3. TTF-1 is negative. The morphologic and immunophenotypic features are consistent with a breast primary. The control slides stain appropriately. ER, PR, and HER-2/neu IHC is pending.   ASSESSMENT / PLAN:   1. Stage IV metastatic breast cancer with liver and lung metastasis, diagnosed by ultrasound-guided liver biopsy on 03/27/15 (Pathology: DIAGNOSIS: A. LIVER, LEFT LOBE; BIOPSY: METASTATIC MAMMARY CARCINOMA, SEE COMMENT. Comment:  The patient has a known history of breast cancer and the current findings of liver and lung lesions are noted. A limited panel of immunohistochemical stains was performed to determine the site of origin. The tumor shows diffuse intense nuclear immunoreactivity for GATA3. TTF-1 is negative. The morphologic and immunophenotypic features are consistent with a breast primary. The control slides stain appropriately. ER, PR, and HER-2/neu IHC is pending)  -  reviewed recent CT scan and also biopsy report from August 15 and discussed with patient and family present (husband, son, daughter-in-law). Have explained that she has metastatic breast cancer, stage IV disease which is incurable and treatments offered are with palliative intent only. Patient has borderline performance status (at least ECOG 2) and otherwise seems to be mostly asymptomatic from liver metastasis, no major pain issues.  Have discussed treatment options including initially pursuing combination of hormonal/targeted therapy with  Faslodex plus Ibrance versus chemotherapy options, and discussed overall palliative intent of treatment, possible response rates and possible side effects of these treatments. Patient wants to pursue combination Faslodex plus Ibrance (125 mg by mouth daily given 3 weeks on and 1 week off cycle), will schedule for first injection of Faslodex 500 mg IM on August 23 and see her back 2 weeks later with labs and plan continued treatment.  2. Cough - will add Cheratussin DAC 4 times a day when necessary. 3. Get bone scan - if she has bone metastasis, will add bone-related therapy with Xgeva.     4. In between visits, the patient has been advised to call or come to the ER in case of fevers, chills, bleeding, acute sickness or new symptoms. Patient and family present are agreeable to this plan.     Leia Alf, MD  03/30/2015 5:14 PM

## 2015-04-02 ENCOUNTER — Other Ambulatory Visit: Payer: Self-pay | Admitting: *Deleted

## 2015-04-03 ENCOUNTER — Telehealth: Payer: Self-pay | Admitting: *Deleted

## 2015-04-03 LAB — GLUCOSE, CAPILLARY
GLUCOSE-CAPILLARY: 65 mg/dL (ref 65–99)
Glucose-Capillary: 81 mg/dL (ref 65–99)
Glucose-Capillary: 161 mg/dL — ABNORMAL HIGH (ref 65–99)

## 2015-04-03 NOTE — Telephone Encounter (Signed)
Patient insurance requires her Lauren Best be sent to [facility], They are forwarding it to them and will fax information to [place]

## 2015-04-04 ENCOUNTER — Encounter
Admission: RE | Admit: 2015-04-04 | Discharge: 2015-04-04 | Disposition: A | Discharge status: Home / Self Care | Payer: Medicare Other | Source: Ambulatory Visit | Attending: Internal Medicine | Admitting: Internal Medicine

## 2015-04-04 ENCOUNTER — Inpatient Hospital Stay: Payer: Medicare Other

## 2015-04-04 VITALS — BP 116/70 | HR 94 | Temp 97.0°F | Resp 18

## 2015-04-04 DIAGNOSIS — C50919 Malignant neoplasm of unspecified site of unspecified female breast: Secondary | ICD-10-CM | POA: Diagnosis not present

## 2015-04-04 DIAGNOSIS — C50911 Malignant neoplasm of unspecified site of right female breast: Secondary | ICD-10-CM | POA: Diagnosis not present

## 2015-04-04 MED ORDER — FULVESTRANT 250 MG/5ML IM SOLN
500.0000 mg | INTRAMUSCULAR | Status: DC
  Administered 2015-04-04: 500 mg via INTRAMUSCULAR
  Filled 2015-04-04: qty 10

## 2015-04-04 MED ORDER — TECHNETIUM TC 99M MEDRONATE IV KIT
23.2000 | PACK | Freq: Once | INTRAVENOUS | Status: AC | PRN
  Administered 2015-04-04: 23.2 via INTRAVENOUS

## 2015-04-05 ENCOUNTER — Telehealth: Payer: Self-pay | Admitting: *Deleted

## 2015-04-05 NOTE — Telephone Encounter (Signed)
Needs to clarify order received use ref # [id number]

## 2015-04-06 LAB — URINALYSIS W MICROSCOPIC (NOT AT ARMC)
BILIRUBIN URINE: NEGATIVE
GLUCOSE, UA: NEGATIVE mg/dL
KETONES UR: NEGATIVE mg/dL
Nitrite: NEGATIVE
PROTEIN: NEGATIVE mg/dL
SPECIFIC GRAVITY, URINE: 1.011 (ref 1.005–1.030)
pH: 5 (ref 5.0–8.0)

## 2015-04-06 NOTE — Telephone Encounter (Signed)
Called and clarified order

## 2015-04-16 NOTE — Progress Notes (Signed)
[facility]  [phone] [phone]) [phone]     ID: Karma Ganja OB: April 25, 1933  MR#: [id number]  RKY#:[id number]  Patient Care Team: Dion Body, MD as PCP - General (Family Medicine)  CHIEF COMPLAINT/DIAGNOSIS:  Stage IV metastatic breast cancer with liver metastasis diagnosed on 03/27/15 (Ultrasound-guided liver biopsy Pathology Report on 03/27/15.  DIAGNOSIS:  A. LIVER, LEFT LOBE; BIOPSY:  METASTATIC MAMMARY CARCINOMA. Comment: The patient has a known history of breast cancer and the current findings of liver and lung lesions are noted. A limited panel of immunohistochemical stains was performed to determine the site of origin. The tumor shows diffuse intense nuclear immunoreactivity for GATA3. TTF-1 is negative. The morphologic and immunophenotypic features are consistent with a breast primary. The control slides stain appropriately. ER positive (>90%), PR positive (>90%), and HER-2/neu equivocal (2+ on IHC, FISH pending).   (Previously T3/T4, N1, M0 (stage IIIB) 5-6 cm right breast invasive ductal carcinoma. PET Scan shows uptake in the site of primary breast tumor and also medially under the first/second rib, possibly internal mammary lymph node area. ER and PR positive (both greater than 90%).   HER-2/neu 2+, NEGATIVE on gene amplification. Received neoadjuvant chemotherapy - completed Adriamycin/Cytoxan x 4, then Taxol weekly x8 doses (discontinued due to severe neuropathy). April 04, 2008 - RIGHT BREAST, MODIFIED RADICAL MASTECTOMY: INVASIVE DUCTAL CARCINOMA, 3.6 CM. METASTATIC CARCINOMA IN 2 OF 11 AXILLARY LYMPH NODES, LARGER MEASURES 0.9 CM, WITH EXTRANODAL EXTENSION.  Then completed radiation. Started hormonal therapy with Femara November 2009).   HISTORY OF PRESENT ILLNESS:  Patient returns for continued oncology followup and make further treatment planning. She had been on adjuvant hormonal therapy with femara since November 2009. Patient was  recently hospitalized with progressive weakness and cough. CT scan of the chest revealed abnormal liver lesions suspicious for metastatic disease. She therefore had an ultrasound-guided biopsy on August 15 which is positive for metastatic breast cancer as described above. States that she continues to have significant weakness, mostly sitting in chair resting in bed. Trying to eat better since hospital discharge. She has intermittent mild right upper quadrant pain. Denies nausea vomiting or diarrhea. Chronic arthritis symptoms in hands is same. She has dyspnea on exertion, but denies orthopnea or PND.    REVIEW OF SYSTEMS:   ROS As in HPI above. In addition, no fever, chills or sweats. No new headaches or focal weakness.  No new sore throat or dysphagia. No new skin rash or bleeding symptoms. Has chronic paresthesias in extremities. No polyuria or polydipsia. PS ECOG 3.  PAST MEDICAL HISTORY: Reviewed. Past Medical History  Diagnosis Date  . Diabetes mellitus without complication   . Hypertension   . Coronary artery disease   . Cancer   . CHF (congestive heart failure)   . AICD (automatic cardioverter/defibrillator) present   . Shortness of breath dyspnea   . Breast cancer right mastectomy    PAST SURGICAL HISTORY: Reviewed. Past Surgical History  Procedure Laterality Date  . Cardiac defibrillator placement Left   . Mastectomy Right   . Joint replacement Right     knee    FAMILY HISTORY: Reviewed. Family History  Problem Relation Age of Onset  . Alzheimer's disease Mother   . Congestive Heart Failure Father     SOCIAL HISTORY: Reviewed. Social History  Substance Use Topics  . Smoking status: Never Smoker   . Smokeless tobacco: Never Used  . Alcohol Use: No    Allergies  Allergen Reactions  . Metformin  Nausea And Vomiting  . Codeine Rash  . Eliquis [Apixaban] Rash and Other (See Comments)    Reaction:  Bleeding     Current Outpatient Prescriptions  Medication Sig  Dispense Refill  . albuterol (PROVENTIL HFA;VENTOLIN HFA) 108 (90 BASE) MCG/ACT inhaler Inhale 2 puffs into the lungs every 6 (six) hours as needed for wheezing or shortness of breath. 1 Inhaler 0  . atorvastatin (LIPITOR) 40 MG tablet Take 40 mg by mouth daily.    . benzonatate (TESSALON PERLES) 100 MG capsule Take 1 capsule (100 mg total) by mouth every 6 (six) hours as needed for cough. 30 capsule 0  . furosemide (LASIX) 20 MG tablet Take 20 mg by mouth daily.    Marland Kitchen guaiFENesin-dextromethorphan (ROBITUSSIN DM) 100-10 MG/5ML syrup Take 5 mLs by mouth every 4 (four) hours as needed for cough. 118 mL 0  . Insulin Lispro Prot & Lispro (HUMALOG 75/25 MIX) (75-25) 100 UNIT/ML Kwikpen Inject into the skin.    Marland Kitchen lisinopril (PRINIVIL,ZESTRIL) 5 MG tablet Take 5 mg by mouth daily.    Marland Kitchen living well with diabetes book MISC 1 each by Does not apply route once. 1 each 0  . metoprolol succinate (TOPROL-XL) 25 MG 24 hr tablet Take 12.5 mg by mouth daily.    . Omega-3 Fatty Acids (FISH OIL) 1000 MG CAPS Take 1 capsule by mouth daily.    . traMADol (ULTRAM) 50 MG tablet Take 1 tablet (50 mg total) by mouth every 8 (eight) hours as needed. 12 tablet 0  . vitamin B-12 (CYANOCOBALAMIN) 1000 MCG tablet Take 1,000 mcg by mouth daily.    . Vitamin D, Ergocalciferol, (DRISDOL) 50000 UNITS CAPS capsule Take 50,000 Units by mouth every 7 (seven) days. Pt takes on Monday.    . pseudoephedrine-codeine-guaifenesin (CHERATUSSIN DAC) 30-10-100 MG/5ML solution Take 5 - 10 mL orally 4 times a day as needed for cough. 240 mL 0   No current facility-administered medications for this visit.    PHYSICAL EXAM: Filed Vitals:   03/30/15 1530  BP: 126/75  Pulse: 97  Temp: 98.5 F (36.9 C)  Resp: 18     Body mass index is 27.46 kg/(m^2).    ECOG FS:3 - Symptomatic, >50% confined to bed  GENERAL: Patient is weak-looking, otherwise alert and oriented and in no acute distress. There is no icterus. HEENT: EOMs intact. Oral exam  negative for thrush or lesions.   CVS: S1S2, regular LUNGS: Bilaterally clear to auscultation, no rhonchi. ABDOMEN: Soft, nontender.    NEURO: grossly nonfocal, cranial nerves are intact.   EXTREMITIES: No pedal edema.   LAB RESULTS:    Component Value Date/Time   NA 139 03/20/2015 0431   NA 138 08/27/2014 0500   K 4.6 03/20/2015 0431   K 4.1 08/27/2014 0500   CL 108 03/20/2015 0431   CL 105 08/27/2014 0500   CO2 26 03/20/2015 0431   CO2 25 08/27/2014 0500   GLUCOSE 110* 03/20/2015 0431   GLUCOSE 63* 08/27/2014 0500   BUN 47* 03/20/2015 0431   BUN 23* 08/27/2014 0500   CREATININE 1.11* 03/20/2015 0431   CREATININE 1.21 08/27/2014 0500   CALCIUM 8.2* 03/20/2015 0431   CALCIUM 9.0 08/27/2014 0500   PROT 7.0 01/31/2015 1935   PROT 8.1 08/24/2014 1252   ALBUMIN 2.7* 01/31/2015 1935   ALBUMIN 2.9* 08/24/2014 1252   AST 126* 01/31/2015 1935   AST 45* 08/24/2014 1252   ALT 85* 01/31/2015 1935   ALT 38 08/24/2014 1252  ALKPHOS 193* 01/31/2015 1935   ALKPHOS 226* 08/24/2014 1252   BILITOT 0.8 01/31/2015 1935   BILITOT 0.4 08/24/2014 1252   GFRNONAA 46* 03/20/2015 0431   GFRNONAA 46* 08/27/2014 0500   GFRNONAA 50* 03/16/2014 0926   GFRAA 53* 03/20/2015 0431   GFRAA 55* 08/27/2014 0500   GFRAA 58* 03/16/2014 0926    Lab Results  Component Value Date   WBC 10.3 03/27/2015   NEUTROABS 12.5* 03/15/2015   HGB 11.1* 03/27/2015   HCT 34.0* 03/27/2015   MCV 87.9 03/27/2015   PLT 257 03/27/2015     STUDIES: 05/27/13 - DEXA scan reported Osteopenia. The T-score left femoral neck -1.2 (was -0.5 in October 2012). Otherwise mostly unremarkable.  03/15/15 - CT Chest.  IMPRESSION:  1. Negative for pulmonary embolus or acute aortic abnormality.  2. Atherosclerosis and coronary artery disease. 3. RIGHT mastectomy with radiation fibrosis in the anterior RIGHT upper lobe.  4. Mediastinal and hilar adenopathy with pleural nodularity, most compatible with lymphangitic spread of tumor.  Infiltration of the liver compatible with metastatic disease.  03/27/15 - Ultrasound-guided liver biopsy Pathology Report.  DIAGNOSIS:  A. LIVER, LEFT LOBE; BIOPSY:  METASTATIC MAMMARY CARCINOMA, SEE COMMENT.  Comment: The patient has a known history of breast cancer and the current findings of liver and lung lesions are noted. A limited panel of immunohistochemical stains was performed to determine the site of origin. The tumor shows diffuse intense nuclear immunoreactivity for GATA3. TTF-1 is negative. The morphologic and immunophenotypic features are consistent with a breast primary. The control slides stain appropriately. ER positive (>90%), PR positive (>90%), and HER-2/neu equivocal (2+ on IHC, FISH pending).     ASSESSMENT / PLAN:   1. Stage IV metastatic breast cancer with liver metastasis diagnosed on 03/27/15 (Ultrasound-guided liver biopsy Pathology Report on 03/27/15.  DIAGNOSIS:  A. LIVER, LEFT LOBE; BIOPSY:  METASTATIC MAMMARY CARCINOMA. Comment: The patient has a known history of breast cancer and the current findings of liver and lung lesions are noted. A limited panel of immunohistochemical stains was performed to determine the site of origin. The tumor shows diffuse intense nuclear immunoreactivity for GATA3. TTF-1 is negative. The morphologic and immunophenotypic features are consistent with a breast primary. The control slides stain appropriately. ER positive (>90%), PR positive (>90%), and HER-2/neu equivocal (2+ on IHC, FISH pending). Previously has known h/o stage IIIB left breast cancer as described above  -  patient and family present but explained in detail about pathology report of liver biopsy, that she now has stage IV metastatic breast cancer and that disease at this stage is incurable, treatments offered are with palliative intent only. She has poor performance status (PS ECOG 3) and would have difficulty tolerating chemotherapy at this time, patient also does not want to  consider chemotherapy unless necessary in the future. Have discussed option of pursuing different hormonal therapy agent Faslodex along with Ibrance and explained palliative intent of treatment, possible response rates and possible side effects. She is agreeable to this and expressed verbal consent. We will also await FISH test result since HER-2/neu was equivocal 2+ on IHC, although previously also FISH was negative. Will get bone scan and if she has bone metastasis will need to consider bone related therapy like Xgeva. Start Faslodex injection on August 23, next MD follow-up on September 6 with lab and plan next Faslodex injection. 2. Anemia  - mild, continue to monitor at this time.  3. Pain  - continue tramadol when necessary, patient advised to  notify if pain worsens.  4. In between visits, she was advised to call or come. In case of any worsening symptoms or acute sickness. Patient is agreeable to this plan.     Leia Alf, MD   04/16/2015 11:17 AM

## 2015-04-18 ENCOUNTER — Inpatient Hospital Stay: Payer: Medicare Other

## 2015-04-18 ENCOUNTER — Inpatient Hospital Stay: Payer: Medicare Other | Attending: Internal Medicine

## 2015-04-18 ENCOUNTER — Inpatient Hospital Stay (HOSPITAL_BASED_OUTPATIENT_CLINIC_OR_DEPARTMENT_OTHER): Payer: Medicare Other | Admitting: Internal Medicine

## 2015-04-18 VITALS — BP 123/81 | HR 99 | Temp 97.9°F | Resp 18 | Ht 63.0 in | Wt 155.9 lb

## 2015-04-18 DIAGNOSIS — C787 Secondary malignant neoplasm of liver and intrahepatic bile duct: Secondary | ICD-10-CM

## 2015-04-18 DIAGNOSIS — Z79818 Long term (current) use of other agents affecting estrogen receptors and estrogen levels: Secondary | ICD-10-CM | POA: Diagnosis not present

## 2015-04-18 DIAGNOSIS — E119 Type 2 diabetes mellitus without complications: Secondary | ICD-10-CM | POA: Diagnosis not present

## 2015-04-18 DIAGNOSIS — Z79899 Other long term (current) drug therapy: Secondary | ICD-10-CM | POA: Diagnosis not present

## 2015-04-18 DIAGNOSIS — D649 Anemia, unspecified: Secondary | ICD-10-CM | POA: Diagnosis not present

## 2015-04-18 DIAGNOSIS — I2510 Atherosclerotic heart disease of native coronary artery without angina pectoris: Secondary | ICD-10-CM | POA: Diagnosis not present

## 2015-04-18 DIAGNOSIS — Z794 Long term (current) use of insulin: Secondary | ICD-10-CM | POA: Insufficient documentation

## 2015-04-18 DIAGNOSIS — C773 Secondary and unspecified malignant neoplasm of axilla and upper limb lymph nodes: Secondary | ICD-10-CM | POA: Insufficient documentation

## 2015-04-18 DIAGNOSIS — Z923 Personal history of irradiation: Secondary | ICD-10-CM | POA: Insufficient documentation

## 2015-04-18 DIAGNOSIS — Z95810 Presence of automatic (implantable) cardiac defibrillator: Secondary | ICD-10-CM | POA: Diagnosis not present

## 2015-04-18 DIAGNOSIS — R531 Weakness: Secondary | ICD-10-CM | POA: Insufficient documentation

## 2015-04-18 DIAGNOSIS — C50912 Malignant neoplasm of unspecified site of left female breast: Secondary | ICD-10-CM | POA: Diagnosis not present

## 2015-04-18 DIAGNOSIS — Z170 Estrogen receptor positive status [ER+]: Secondary | ICD-10-CM | POA: Diagnosis not present

## 2015-04-18 DIAGNOSIS — I10 Essential (primary) hypertension: Secondary | ICD-10-CM | POA: Insufficient documentation

## 2015-04-18 DIAGNOSIS — C50919 Malignant neoplasm of unspecified site of unspecified female breast: Secondary | ICD-10-CM

## 2015-04-18 DIAGNOSIS — Z9221 Personal history of antineoplastic chemotherapy: Secondary | ICD-10-CM | POA: Insufficient documentation

## 2015-04-18 DIAGNOSIS — R0602 Shortness of breath: Secondary | ICD-10-CM

## 2015-04-18 DIAGNOSIS — R1011 Right upper quadrant pain: Secondary | ICD-10-CM

## 2015-04-18 DIAGNOSIS — C7800 Secondary malignant neoplasm of unspecified lung: Secondary | ICD-10-CM

## 2015-04-18 DIAGNOSIS — Z9012 Acquired absence of left breast and nipple: Secondary | ICD-10-CM | POA: Insufficient documentation

## 2015-04-18 MED ORDER — FULVESTRANT 250 MG/5ML IM SOLN
500.0000 mg | INTRAMUSCULAR | Status: DC
  Administered 2015-04-18: 500 mg via INTRAMUSCULAR
  Filled 2015-04-18: qty 10

## 2015-04-18 MED ORDER — PSEUDOEPHEDRINE-CODEINE-GG 30-10-100 MG/5ML PO SOLN: ORAL | Status: DC

## 2015-04-18 NOTE — Progress Notes (Signed)
Patient is here for follow-up. She has fallen twice in the past couple of weeks. She fell this past Friday and has a knot on her forehead. She also states that she has some pain in her right side where she fell. She states that it only hurt when she moves or lays a certain way. Patient also states that she has still been coughing a lot. She has not started the [place] yet.

## 2015-04-20 ENCOUNTER — Telehealth: Payer: Self-pay | Admitting: *Deleted

## 2015-04-20 NOTE — Telephone Encounter (Signed)
Lauren Best left one message to call his mom and speak with her and then left another and said to call him because sometimes she gets confused on the phone.  I called [facility] and spoek to [facility] and she states they have called several times and once spoke to pt and when they talked about shipping out her medicine she told them to hold it because she gets all her meds from one company and does not want to change companies.  Other times they have called and left messages.  Their records indicate that have called the son Lauren Best and left message but never got rtn phone call.  When I spoke to Lauren Best he states that his mom gets confused and it is best to call Lauren Best and get things worked out so that the medicine can get shipped to her.  He will let his brother know also.

## 2015-04-21 ENCOUNTER — Telehealth: Payer: Self-pay | Admitting: *Deleted

## 2015-04-21 MED ORDER — CIPROFLOXACIN HCL 500 MG PO TABS: 500.0000 mg | ORAL_TABLET | Freq: Two times a day (BID) | ORAL | Status: DC

## 2015-04-21 NOTE — Telephone Encounter (Signed)
Husband called and said that something is causing her sugar to go up. She has been eating right like a diabetic should. I checked to see if the cough syrup causes inc. Sugar level and did not find that was a side effect of the med.  Then husband said that when she goes to the bathroom her urine has bad odor.  I placed him on hold while I spoke to pandit and he said that if she has UTI it can make her sugar go up and since she is having sx we will call in cipro 500 mg bid x 7 days. Husband agreeable to this plan

## 2015-04-24 LAB — SURGICAL PATHOLOGY

## 2015-05-02 ENCOUNTER — Inpatient Hospital Stay: Payer: Medicare Other

## 2015-05-02 VITALS — BP 120/78 | HR 102 | Temp 97.0°F | Resp 18

## 2015-05-02 DIAGNOSIS — C50919 Malignant neoplasm of unspecified site of unspecified female breast: Secondary | ICD-10-CM

## 2015-05-02 DIAGNOSIS — C50912 Malignant neoplasm of unspecified site of left female breast: Secondary | ICD-10-CM | POA: Diagnosis not present

## 2015-05-02 MED ORDER — FULVESTRANT 250 MG/5ML IM SOLN
500.0000 mg | INTRAMUSCULAR | Status: DC
  Administered 2015-05-02: 500 mg via INTRAMUSCULAR
  Filled 2015-05-02: qty 10

## 2015-05-03 NOTE — Progress Notes (Addendum)
[facility]  [phone] [phone]     ID: Lauren Best OB: 07/21/1933  MR#: [id number]  CSN#:[id number]  Patient Care Team: Kanhka Linthavong, MD as PCP - General (Family Medicine)  CHIEF COMPLAINT/DIAGNOSIS:  Stage IV metastatic breast cancer with liver metastasis diagnosed on 03/27/15 (Ultrasound-guided liver biopsy Pathology Report on 03/27/15.  DIAGNOSIS:  A. LIVER, LEFT LOBE; BIOPSY:  METASTATIC MAMMARY CARCINOMA. Comment: The patient has a known history of breast cancer and the current findings of liver and lung lesions are noted. A limited panel of immunohistochemical stains was performed to determine the site of origin. The tumor shows diffuse intense nuclear immunoreactivity for GATA3. TTF-1 is negative. The morphologic and immunophenotypic features are consistent with a breast primary. The control slides stain appropriately. ER positive (>90%), PR positive (>90%), and HER-2/neu equivocal (2+ on IHC, FISH negative, Her2/CEP17 ratio 1.13).   (Previously T3/T4, N1, M0 (stage IIIB) 5-6 cm right breast invasive ductal carcinoma. PET Scan shows uptake in the site of primary breast tumor and also medially under the first/second rib, possibly internal mammary lymph node area. ER and PR positive (both greater than 90%).   HER-2/neu 2+, NEGATIVE on gene amplification. Received neoadjuvant chemotherapy - completed Adriamycin/Cytoxan x 4, then Taxol weekly x8 doses (discontinued due to severe neuropathy). April 04, 2008 - RIGHT BREAST, MODIFIED RADICAL MASTECTOMY: INVASIVE DUCTAL CARCINOMA, 3.6 CM. METASTATIC CARCINOMA IN 2 OF 11 AXILLARY LYMPH NODES, LARGER MEASURES 0.9 CM, WITH EXTRANODAL EXTENSION.  Then completed radiation. Started hormonal therapy with Femara November 2009).  Started Faslodex 04/04/15, also starting Ibrance.    HISTORY OF PRESENT ILLNESS:  Patient returns for continued oncology followup. States she is overall weak and fell x 1 at home but  attributes this to walker tripped in bathroom.  She is mostly sitting in chair or resting in bed. Trying to eat better since hospital discharge. She has intermittent mild right upper quadrant pain. Denies nausea vomiting or diarrhea. Chronic arthritis symptoms in hands is same. She has dyspnea on exertion.    REVIEW OF SYSTEMS:   ROS As in HPI above. In addition, no fever, chills. No new headaches or focal weakness.  No new sore throat or dysphagia. No new skin rash or bleeding symptoms. Has chronic paresthesias in extremities. PS ECOG 3.  PAST MEDICAL HISTORY: Reviewed. Past Medical History  Diagnosis Date  . Diabetes mellitus without complication   . Hypertension   . Coronary artery disease   . Cancer   . CHF (congestive heart failure)   . AICD (automatic cardioverter/defibrillator) present   . Shortness of breath dyspnea   . Breast cancer right mastectomy    PAST SURGICAL HISTORY: Reviewed. Past Surgical History  Procedure Laterality Date  . Cardiac defibrillator placement Left   . Mastectomy Right   . Joint replacement Right     knee    FAMILY HISTORY: Reviewed. Family History  Problem Relation Age of Onset  . Alzheimer's disease Mother   . Congestive Heart Failure Father     SOCIAL HISTORY: Reviewed. Social History  Substance Use Topics  . Smoking status: Never Smoker   . Smokeless tobacco: Never Used  . Alcohol Use: No    Allergies  Allergen Reactions  . Metformin Nausea And Vomiting  . Codeine Rash  . Eliquis [Apixaban] Rash and Other (See Comments)    Reaction:  Bleeding     Current Outpatient Prescriptions  Medication Sig Dispense Refill  . albuterol (PROVENTIL HFA;VENTOLIN HFA) 108 (90 BASE)   MCG/ACT inhaler Inhale 2 puffs into the lungs every 6 (six) hours as needed for wheezing or shortness of breath. 1 Inhaler 0  . atorvastatin (LIPITOR) 40 MG tablet Take 40 mg by mouth daily.    . benzonatate (TESSALON PERLES) 100 MG capsule Take 1 capsule (100 mg  total) by mouth every 6 (six) hours as needed for cough. 30 capsule 0  . furosemide (LASIX) 20 MG tablet Take 20 mg by mouth daily.    Marland Kitchen guaiFENesin-dextromethorphan (ROBITUSSIN DM) 100-10 MG/5ML syrup Take 5 mLs by mouth every 4 (four) hours as needed for cough. 118 mL 0  . Insulin Lispro Prot & Lispro (HUMALOG 75/25 MIX) (75-25) 100 UNIT/ML Kwikpen Inject into the skin.    Marland Kitchen lisinopril (PRINIVIL,ZESTRIL) 5 MG tablet Take 5 mg by mouth daily.    Marland Kitchen living well with diabetes book MISC 1 each by Does not apply route once. 1 each 0  . metoprolol succinate (TOPROL-XL) 25 MG 24 hr tablet Take 12.5 mg by mouth daily.    . Omega-3 Fatty Acids (FISH OIL) 1000 MG CAPS Take 1 capsule by mouth daily.    . pseudoephedrine-codeine-guaifenesin (CHERATUSSIN DAC) 30-10-100 MG/5ML solution Take 5 - 10 mL orally 4 times a day as needed for cough. 240 mL 0  . traMADol (ULTRAM) 50 MG tablet Take 1 tablet (50 mg total) by mouth every 8 (eight) hours as needed. 12 tablet 0  . vitamin B-12 (CYANOCOBALAMIN) 1000 MCG tablet Take 1,000 mcg by mouth daily.    . Vitamin D, Ergocalciferol, (DRISDOL) 50000 UNITS CAPS capsule Take 50,000 Units by mouth every 7 (seven) days. Pt takes on Monday.    . ciprofloxacin (CIPRO) 500 MG tablet Take 1 tablet (500 mg total) by mouth 2 (two) times daily. 14 tablet 0   No current facility-administered medications for this visit.    PHYSICAL EXAM: Filed Vitals:   04/18/15 1510  BP: 123/81  Pulse: 99  Temp: 97.9 F (36.6 C)  Resp: 18     Body mass index is 27.62 kg/(m^2).    ECOG FS:3 - Symptomatic, >50% confined to bed  GENERAL: chronically weak-looking, otherwise alert and oriented and in no acute distress. No icterus. HEENT: EOMs intact. Oral exam negative for thrush or lesions.   CVS: S1S2, regular LUNGS: Bilaterally clear to auscultation, no rhonchi. ABDOMEN: Soft, nontender.    EXTREMITIES: No pedal edema.   LAB RESULTS:    Component Value Date/Time   NA 139 03/20/2015  0431   NA 138 08/27/2014 0500   K 4.6 03/20/2015 0431   K 4.1 08/27/2014 0500   CL 108 03/20/2015 0431   CL 105 08/27/2014 0500   CO2 26 03/20/2015 0431   CO2 25 08/27/2014 0500   GLUCOSE 110* 03/20/2015 0431   GLUCOSE 63* 08/27/2014 0500   BUN 47* 03/20/2015 0431   BUN 23* 08/27/2014 0500   CREATININE 1.11* 03/20/2015 0431   CREATININE 1.21 08/27/2014 0500   CALCIUM 8.2* 03/20/2015 0431   CALCIUM 9.0 08/27/2014 0500   PROT 7.0 01/31/2015 1935   PROT 8.1 08/24/2014 1252   ALBUMIN 2.7* 01/31/2015 1935   ALBUMIN 2.9* 08/24/2014 1252   AST 126* 01/31/2015 1935   AST 45* 08/24/2014 1252   ALT 85* 01/31/2015 1935   ALT 38 08/24/2014 1252   ALKPHOS 193* 01/31/2015 1935   ALKPHOS 226* 08/24/2014 1252   BILITOT 0.8 01/31/2015 1935   BILITOT 0.4 08/24/2014 1252   GFRNONAA 46* 03/20/2015 0431   GFRNONAA 46* 08/27/2014  0500   GFRNONAA 50* 03/16/2014 0926   GFRAA 53* 03/20/2015 0431   GFRAA 55* 08/27/2014 0500   GFRAA 58* 03/16/2014 0926    Lab Results  Component Value Date   WBC 10.3 03/27/2015   NEUTROABS 12.5* 03/15/2015   HGB 11.1* 03/27/2015   HCT 34.0* 03/27/2015   MCV 87.9 03/27/2015   PLT 257 03/27/2015     STUDIES: 05/27/13 - DEXA scan reported Osteopenia. The T-score left femoral neck -1.2 (was -0.5 in October 2012). Otherwise mostly unremarkable.  03/15/15 - CT Chest.  IMPRESSION:  1. Negative for pulmonary embolus or acute aortic abnormality.  2. Atherosclerosis and coronary artery disease. 3. RIGHT mastectomy with radiation fibrosis in the anterior RIGHT upper lobe.  4. Mediastinal and hilar adenopathy with pleural nodularity, most compatible with lymphangitic spread of tumor. Infiltration of the liver compatible with metastatic disease.  03/27/15 - Ultrasound-guided liver biopsy Pathology Report.  DIAGNOSIS:  A. LIVER, LEFT LOBE; BIOPSY:  METASTATIC MAMMARY CARCINOMA, SEE COMMENT.  Comment: The patient has a known history of breast cancer and the current findings  of liver and lung lesions are noted. A limited panel of immunohistochemical stains was performed to determine the site of origin. The tumor shows diffuse intense nuclear immunoreactivity for GATA3. TTF-1 is negative. The morphologic and immunophenotypic features are consistent with a breast primary. The control slides stain appropriately. ER positive (>90%), PR positive (>90%), and HER-2/neu equivocal (2+ on IHC, FISH pending).     ASSESSMENT / PLAN:   1. Stage IV metastatic breast cancer with liver metastasis diagnosed on 03/27/15 (Ultrasound-guided liver biopsy Pathology Report on 03/27/15.  DIAGNOSIS:  A. LIVER, LEFT LOBE; BIOPSY:  METASTATIC MAMMARY CARCINOMA. Comment: The patient has a known history of breast cancer and the current findings of liver and lung lesions are noted. A limited panel of immunohistochemical stains was performed to determine the site of origin. The tumor shows diffuse intense nuclear immunoreactivity for GATA3. TTF-1 is negative. The morphologic and immunophenotypic features are consistent with a breast primary. The control slides stain appropriately. ER positive (>90%), PR positive (>90%), and HER-2/neu equivocal (2+ on IHC, FISH pending). Previously has known h/o stage IIIB left breast cancer as described above  -  Still weak, have advised to take further [precautions to prevent recurrent falls. Otherwise denies side effects from ongoing treatment. Will continue on faslodex and ibrance, and monitor. Will schedule for next faslodex in 2 weeks, next MD f/u at 6 weeks with labs and plan continued faslodex injection.  2. Anemia  - mild, continue to monitor at this time.  3. Pain  - continue tramadol when necessary, patient advised to notify if pain worsens.  4. In between visits, she was advised to call or come. In case of any worsening symptoms or acute sickness. Patient is agreeable to this plan.     Sandeep Pandit, MD   05/03/2015 11:23 PM      

## 2015-05-08 ENCOUNTER — Emergency Department: Payer: Medicare Other

## 2015-05-08 ENCOUNTER — Observation Stay
Admission: EM | Admit: 2015-05-08 | Discharge: 2015-05-11 | Disposition: A | Discharge status: Home / Self Care | Payer: Medicare Other | Attending: Internal Medicine | Admitting: Internal Medicine

## 2015-05-08 ENCOUNTER — Encounter: Payer: Self-pay | Admitting: Emergency Medicine

## 2015-05-08 DIAGNOSIS — Z95810 Presence of automatic (implantable) cardiac defibrillator: Secondary | ICD-10-CM | POA: Insufficient documentation

## 2015-05-08 DIAGNOSIS — E871 Hypo-osmolality and hyponatremia: Secondary | ICD-10-CM | POA: Insufficient documentation

## 2015-05-08 DIAGNOSIS — Z8249 Family history of ischemic heart disease and other diseases of the circulatory system: Secondary | ICD-10-CM | POA: Diagnosis not present

## 2015-05-08 DIAGNOSIS — M179 Osteoarthritis of knee, unspecified: Secondary | ICD-10-CM | POA: Diagnosis not present

## 2015-05-08 DIAGNOSIS — Z7951 Long term (current) use of inhaled steroids: Secondary | ICD-10-CM | POA: Insufficient documentation

## 2015-05-08 DIAGNOSIS — M5489 Other dorsalgia: Secondary | ICD-10-CM

## 2015-05-08 DIAGNOSIS — Z794 Long term (current) use of insulin: Secondary | ICD-10-CM | POA: Diagnosis not present

## 2015-05-08 DIAGNOSIS — I252 Old myocardial infarction: Secondary | ICD-10-CM | POA: Diagnosis not present

## 2015-05-08 DIAGNOSIS — K2970 Gastritis, unspecified, without bleeding: Secondary | ICD-10-CM | POA: Insufficient documentation

## 2015-05-08 DIAGNOSIS — Z885 Allergy status to narcotic agent status: Secondary | ICD-10-CM | POA: Diagnosis not present

## 2015-05-08 DIAGNOSIS — Z888 Allergy status to other drugs, medicaments and biological substances status: Secondary | ICD-10-CM | POA: Insufficient documentation

## 2015-05-08 DIAGNOSIS — M8580 Other specified disorders of bone density and structure, unspecified site: Secondary | ICD-10-CM | POA: Diagnosis not present

## 2015-05-08 DIAGNOSIS — R079 Chest pain, unspecified: Secondary | ICD-10-CM | POA: Diagnosis present

## 2015-05-08 DIAGNOSIS — L853 Xerosis cutis: Secondary | ICD-10-CM | POA: Insufficient documentation

## 2015-05-08 DIAGNOSIS — B3781 Candidal esophagitis: Secondary | ICD-10-CM | POA: Diagnosis not present

## 2015-05-08 DIAGNOSIS — R072 Precordial pain: Secondary | ICD-10-CM | POA: Diagnosis not present

## 2015-05-08 DIAGNOSIS — I2510 Atherosclerotic heart disease of native coronary artery without angina pectoris: Secondary | ICD-10-CM | POA: Insufficient documentation

## 2015-05-08 DIAGNOSIS — E559 Vitamin D deficiency, unspecified: Secondary | ICD-10-CM | POA: Insufficient documentation

## 2015-05-08 DIAGNOSIS — N179 Acute kidney failure, unspecified: Secondary | ICD-10-CM | POA: Diagnosis not present

## 2015-05-08 DIAGNOSIS — M545 Low back pain: Secondary | ICD-10-CM | POA: Diagnosis not present

## 2015-05-08 DIAGNOSIS — R05 Cough: Secondary | ICD-10-CM | POA: Diagnosis not present

## 2015-05-08 DIAGNOSIS — Z820 Family history of epilepsy and other diseases of the nervous system: Secondary | ICD-10-CM | POA: Diagnosis not present

## 2015-05-08 DIAGNOSIS — C787 Secondary malignant neoplasm of liver and intrahepatic bile duct: Secondary | ICD-10-CM | POA: Diagnosis not present

## 2015-05-08 DIAGNOSIS — E162 Hypoglycemia, unspecified: Secondary | ICD-10-CM | POA: Insufficient documentation

## 2015-05-08 DIAGNOSIS — I509 Heart failure, unspecified: Secondary | ICD-10-CM | POA: Insufficient documentation

## 2015-05-08 DIAGNOSIS — C7800 Secondary malignant neoplasm of unspecified lung: Secondary | ICD-10-CM | POA: Diagnosis not present

## 2015-05-08 DIAGNOSIS — K209 Esophagitis, unspecified without bleeding: Secondary | ICD-10-CM | POA: Insufficient documentation

## 2015-05-08 DIAGNOSIS — E780 Pure hypercholesterolemia: Secondary | ICD-10-CM | POA: Insufficient documentation

## 2015-05-08 DIAGNOSIS — D649 Anemia, unspecified: Secondary | ICD-10-CM | POA: Insufficient documentation

## 2015-05-08 DIAGNOSIS — I429 Cardiomyopathy, unspecified: Secondary | ICD-10-CM | POA: Diagnosis not present

## 2015-05-08 DIAGNOSIS — E1165 Type 2 diabetes mellitus with hyperglycemia: Secondary | ICD-10-CM | POA: Diagnosis not present

## 2015-05-08 DIAGNOSIS — R52 Pain, unspecified: Secondary | ICD-10-CM

## 2015-05-08 DIAGNOSIS — Z79899 Other long term (current) drug therapy: Secondary | ICD-10-CM | POA: Diagnosis not present

## 2015-05-08 DIAGNOSIS — M5116 Intervertebral disc disorders with radiculopathy, lumbar region: Secondary | ICD-10-CM | POA: Insufficient documentation

## 2015-05-08 DIAGNOSIS — J209 Acute bronchitis, unspecified: Secondary | ICD-10-CM | POA: Diagnosis not present

## 2015-05-08 DIAGNOSIS — C50919 Malignant neoplasm of unspecified site of unspecified female breast: Secondary | ICD-10-CM | POA: Diagnosis not present

## 2015-05-08 DIAGNOSIS — R1310 Dysphagia, unspecified: Secondary | ICD-10-CM | POA: Diagnosis not present

## 2015-05-08 DIAGNOSIS — I10 Essential (primary) hypertension: Secondary | ICD-10-CM | POA: Diagnosis not present

## 2015-05-08 DIAGNOSIS — Z515 Encounter for palliative care: Secondary | ICD-10-CM | POA: Diagnosis not present

## 2015-05-08 DIAGNOSIS — R918 Other nonspecific abnormal finding of lung field: Secondary | ICD-10-CM | POA: Insufficient documentation

## 2015-05-08 DIAGNOSIS — I214 Non-ST elevation (NSTEMI) myocardial infarction: Secondary | ICD-10-CM

## 2015-05-08 DIAGNOSIS — M549 Dorsalgia, unspecified: Secondary | ICD-10-CM | POA: Diagnosis not present

## 2015-05-08 DIAGNOSIS — I4892 Unspecified atrial flutter: Secondary | ICD-10-CM | POA: Insufficient documentation

## 2015-05-08 DIAGNOSIS — G8929 Other chronic pain: Secondary | ICD-10-CM | POA: Insufficient documentation

## 2015-05-08 DIAGNOSIS — Z96651 Presence of right artificial knee joint: Secondary | ICD-10-CM | POA: Diagnosis not present

## 2015-05-08 DIAGNOSIS — R1314 Dysphagia, pharyngoesophageal phase: Secondary | ICD-10-CM | POA: Insufficient documentation

## 2015-05-08 LAB — CBC
HEMATOCRIT: 33.3 % — AB (ref 35.0–47.0)
Hemoglobin: 11.2 g/dL — ABNORMAL LOW (ref 12.0–16.0)
MCH: 30.1 pg (ref 26.0–34.0)
MCHC: 33.4 g/dL (ref 32.0–36.0)
MCV: 89.9 fL (ref 80.0–100.0)
Platelets: 351 10*3/uL (ref 150–440)
RBC: 3.71 MIL/uL — ABNORMAL LOW (ref 3.80–5.20)
RDW: 18.3 % — AB (ref 11.5–14.5)
WBC: 10.6 10*3/uL (ref 3.6–11.0)

## 2015-05-08 LAB — BASIC METABOLIC PANEL
Anion gap: 9 (ref 5–15)
BUN: 35 mg/dL — AB (ref 6–20)
CHLORIDE: 95 mmol/L — AB (ref 101–111)
CO2: 29 mmol/L (ref 22–32)
Calcium: 8.6 mg/dL — ABNORMAL LOW (ref 8.9–10.3)
Creatinine, Ser: 1.62 mg/dL — ABNORMAL HIGH (ref 0.44–1.00)
GFR calc Af Amer: 33 mL/min — ABNORMAL LOW (ref >60)
GFR calc non Af Amer: 29 mL/min — ABNORMAL LOW (ref >60)
GLUCOSE: 112 mg/dL — AB (ref 65–99)
POTASSIUM: 4.8 mmol/L (ref 3.5–5.1)
Sodium: 133 mmol/L — ABNORMAL LOW (ref 135–145)

## 2015-05-08 LAB — TROPONIN I: TROPONIN I: 0.06 ng/mL — AB (ref <0.031)

## 2015-05-08 MED ORDER — ONDANSETRON HCL 4 MG PO TABS: 4.0000 mg | ORAL_TABLET | Freq: Four times a day (QID) | ORAL | Status: DC | PRN

## 2015-05-08 MED ORDER — DOCUSATE SODIUM 100 MG PO CAPS
100.0000 mg | ORAL_CAPSULE | Freq: Two times a day (BID) | ORAL | Status: DC
  Administered 2015-05-09 – 2015-05-11 (×5): 100 mg via ORAL
  Filled 2015-05-08 (×5): qty 1

## 2015-05-08 MED ORDER — HEPARIN SODIUM (PORCINE) 5000 UNIT/ML IJ SOLN
5000.0000 [IU] | Freq: Three times a day (TID) | INTRAMUSCULAR | Status: DC
  Administered 2015-05-09 (×3): 5000 [IU] via SUBCUTANEOUS
  Filled 2015-05-08 (×3): qty 1

## 2015-05-08 MED ORDER — HYDROMORPHONE HCL 1 MG/ML IJ SOLN: 0.5000 mg | INTRAMUSCULAR | Status: DC | PRN

## 2015-05-08 MED ORDER — FENTANYL CITRATE (PF) 100 MCG/2ML IJ SOLN
25.0000 ug | Freq: Once | INTRAMUSCULAR | Status: AC
  Administered 2015-05-08: 25 ug via INTRAVENOUS
  Filled 2015-05-08: qty 2

## 2015-05-08 MED ORDER — OXYCODONE HCL 5 MG PO TABS: 5.0000 mg | ORAL_TABLET | ORAL | Status: DC | PRN

## 2015-05-08 MED ORDER — ONDANSETRON HCL 4 MG/2ML IJ SOLN: 4.0000 mg | Freq: Four times a day (QID) | INTRAMUSCULAR | Status: DC | PRN

## 2015-05-08 MED ORDER — ACETAMINOPHEN 325 MG PO TABS: 650.0000 mg | ORAL_TABLET | Freq: Four times a day (QID) | ORAL | Status: DC | PRN

## 2015-05-08 MED ORDER — POLYETHYLENE GLYCOL 3350 17 G PO PACK: 17.0000 g | PACK | Freq: Every day | ORAL | Status: DC | PRN

## 2015-05-08 MED ORDER — ONDANSETRON HCL 4 MG/2ML IJ SOLN
4.0000 mg | Freq: Once | INTRAMUSCULAR | Status: AC
Start: 2015-05-08 — End: 2015-05-08
  Administered 2015-05-08: 4 mg via INTRAVENOUS
  Filled 2015-05-08: qty 2

## 2015-05-08 MED ORDER — ASPIRIN 81 MG PO CHEW
324.0000 mg | CHEWABLE_TABLET | Freq: Once | ORAL | Status: AC
  Administered 2015-05-08: 324 mg via ORAL
  Filled 2015-05-08: qty 4

## 2015-05-08 MED ORDER — SODIUM CHLORIDE 0.9 % IV SOLN
INTRAVENOUS | Status: DC
  Administered 2015-05-09 – 2015-05-10 (×3): via INTRAVENOUS

## 2015-05-08 MED ORDER — MORPHINE SULFATE (PF) 4 MG/ML IV SOLN
4.0000 mg | Freq: Once | INTRAVENOUS | Status: AC
  Administered 2015-05-08: 4 mg via INTRAVENOUS
  Filled 2015-05-08: qty 1

## 2015-05-08 MED ORDER — DIPHENHYDRAMINE HCL 25 MG PO CAPS: 25.0000 mg | ORAL_CAPSULE | Freq: Every evening | ORAL | Status: DC | PRN

## 2015-05-08 MED ORDER — GUAIFENESIN 100 MG/5ML PO SOLN
10.0000 mL | ORAL | Status: DC | PRN
  Filled 2015-05-08: qty 10

## 2015-05-08 MED ORDER — SODIUM CHLORIDE 0.9 % IV BOLUS (SEPSIS)
250.0000 mL | Freq: Once | INTRAVENOUS | Status: AC
  Administered 2015-05-08: 250 mL via INTRAVENOUS

## 2015-05-08 MED ORDER — SODIUM CHLORIDE 0.9 % IJ SOLN
3.0000 mL | Freq: Two times a day (BID) | INTRAMUSCULAR | Status: DC
  Administered 2015-05-09 – 2015-05-10 (×3): 3 mL via INTRAVENOUS

## 2015-05-08 MED ORDER — ACETAMINOPHEN 650 MG RE SUPP: 650.0000 mg | Freq: Four times a day (QID) | RECTAL | Status: DC | PRN

## 2015-05-08 NOTE — ED Notes (Addendum)
Pt presents with low back pain, cough and chest pain for two months.  Pt was seen at [facility] by her cardiologist today for check up and they performed an ekg and was normal. Pt also states its hard for her to swallow food.

## 2015-05-08 NOTE — ED Provider Notes (Signed)
[facility] Emergency Department Provider Note  ____________________________________________  Time seen: Approximately 8:07 PM  I have reviewed the triage vital signs and the nursing notes.   HISTORY  Chief Complaint Back Pain and Chest Pain    HPI STELLAR Lauren Best is a 81 y.o. female with stage IV metastatic breast cancer on oral chemotherapy with metastases to liver, lung, history of coronary artery disease, history of CHF with AICD placement, history of diabetes who presents for evaluation of several weeks of worsening back pain and aching chest pain, gradual onset, constant since onset. Currently her pain is severe. She also reports chronic nonproductive cough which is been ongoing for 2 months and does not ever seem to stop. Today her chest pain is more severe. It is constant, does not radiate to her back and she feels as if the chest pain and back pain are separate. She denies any recent falls with injury to the back. She denies any bowel or bladder incontinence and she denies any fevers. She reports the pain in her chest today as so severe "it is worse than a heart attack". She is also reporting painful swallowing though she is able to eat and drink. Pain in her chest and back are worse with movement. She has been following up with her cancer doctor. She was seen by her cardiologist at [facility] with an EKG which is unchanged from prior. No vomiting, or diarrhea.   Past Medical History  Diagnosis Date  . Diabetes mellitus without complication   . Hypertension   . Coronary artery disease   . Cancer   . CHF (congestive heart failure)   . AICD (automatic cardioverter/defibrillator) present   . Shortness of breath dyspnea   . Breast cancer right mastectomy    Patient Active Problem List   Diagnosis Date Noted  . Breast CA 03/30/2015  . Cardiomyopathy 03/30/2015  . Essential (primary) hypertension 03/30/2015  . Normocytic anemia 03/30/2015  . Arthritis of knee,  degenerative 03/30/2015  . Osteopenia 03/30/2015  . Avitaminosis D 03/30/2015  . Metastatic cancer to lung 03/20/2015  . Metastatic cancer to liver 03/20/2015  . Diabetes 03/20/2015  . Acute bronchitis 03/15/2015  . Automatic implantable cardioverter-defibrillator in situ 01/16/2015  . Asteatotic dermatitis 08/17/2014  . Atrial flutter 07/19/2014  . CAD in native artery 07/12/2014  . Acute anterior myocardial infarction 07/08/2014  . Diabetes mellitus type 2, uncontrolled 01/25/2014  . Long term current use of insulin 01/25/2014  . Microalbuminuria 01/25/2014  . Hypercholesterolemia without hypertriglyceridemia 01/25/2014    Past Surgical History  Procedure Laterality Date  . Cardiac defibrillator placement Left   . Mastectomy Right   . Joint replacement Right     knee    Current Outpatient Rx  Name  Route  Sig  Dispense  Refill  . albuterol (PROVENTIL HFA;VENTOLIN HFA) 108 (90 BASE) MCG/ACT inhaler   Inhalation   Inhale 2 puffs into the lungs every 6 (six) hours as needed for wheezing or shortness of breath.   1 Inhaler   0   . atorvastatin (LIPITOR) 40 MG tablet   Oral   Take 40 mg by mouth daily.         . benzonatate (TESSALON PERLES) 100 MG capsule   Oral   Take 1 capsule (100 mg total) by mouth every 6 (six) hours as needed for cough.   30 capsule   0   . ciprofloxacin (CIPRO) 500 MG tablet   Oral   Take 1 tablet (  500 mg total) by mouth 2 (two) times daily.   14 tablet   0   . furosemide (LASIX) 20 MG tablet   Oral   Take 20 mg by mouth daily.         Marland Kitchen guaiFENesin-dextromethorphan (ROBITUSSIN DM) 100-10 MG/5ML syrup   Oral   Take 5 mLs by mouth every 4 (four) hours as needed for cough.   118 mL   0   . Insulin Lispro Prot & Lispro (HUMALOG 75/25 MIX) (75-25) 100 UNIT/ML Kwikpen   Subcutaneous   Inject into the skin.         Marland Kitchen lisinopril (PRINIVIL,ZESTRIL) 5 MG tablet   Oral   Take 5 mg by mouth daily.         Marland Kitchen living well with  diabetes book MISC   Does not apply   1 each by Does not apply route once.   1 each   0   . metoprolol succinate (TOPROL-XL) 25 MG 24 hr tablet   Oral   Take 12.5 mg by mouth daily.         . Omega-3 Fatty Acids (FISH OIL) 1000 MG CAPS   Oral   Take 1 capsule by mouth daily.         . pseudoephedrine-codeine-guaifenesin (CHERATUSSIN DAC) 30-10-100 MG/5ML solution      Take 5 - 10 mL orally 4 times a day as needed for cough.   240 mL   0   . traMADol (ULTRAM) 50 MG tablet   Oral   Take 1 tablet (50 mg total) by mouth every 8 (eight) hours as needed.   12 tablet   0   . vitamin B-12 (CYANOCOBALAMIN) 1000 MCG tablet   Oral   Take 1,000 mcg by mouth daily.         . Vitamin D, Ergocalciferol, (DRISDOL) 50000 UNITS CAPS capsule   Oral   Take 50,000 Units by mouth every 7 (seven) days. Pt takes on Monday.           Allergies Metformin; Codeine; and Eliquis  Family History  Problem Relation Age of Onset  . Alzheimer's disease Mother   . Congestive Heart Failure Father     Social History Social History  Substance Use Topics  . Smoking status: Never Smoker   . Smokeless tobacco: Never Used  . Alcohol Use: No    Review of Systems Constitutional: No fever/chills Eyes: No visual changes. ENT: No sore throat. Cardiovascular: + chest pain. Respiratory: +shortness of breath. Gastrointestinal: No abdominal pain.  No nausea, no vomiting.  No diarrhea.  No constipation. Genitourinary: Negative for dysuria. Musculoskeletal: Negative for back pain. Skin: Negative for rash. Neurological: Negative for headaches, focal weakness or numbness.  10-point ROS otherwise negative.  ____________________________________________   PHYSICAL EXAM:  VITAL SIGNS: ED Triage Vitals  Enc Vitals Group     BP 05/08/15 1845 96/79 mmHg     Pulse Rate 05/08/15 1843 90     Resp 05/08/15 1843 20     Temp 05/08/15 1843 98.3 F (36.8 C)     Temp Source 05/08/15 1843 Oral      SpO2 05/08/15 1843 98 %     Weight 05/08/15 1843 150 lb (68.04 kg)     Height 05/08/15 1843 5\' 3"  (1.6 m)     Head Cir --      Peak Flow --      Pain Score --      Pain Loc --  Pain Edu? --      Excl. in [place]? --     Constitutional: Alert and oriented. Fatigued but nontoxic appearing. Frequent dry cough. Eyes: Conjunctivae are normal. PERRL. EOMI. Head: Atraumatic. Nose: No congestion/rhinnorhea. Mouth/Throat: Mucous membranes are moist.  Oropharynx non-erythematous. Neck: No stridor.   Cardiovascular: Normal rate, regular rhythm. Grossly normal heart sounds.  Good peripheral circulation. Respiratory: Normal respiratory effort.  No retractions. Lungs CTAB. Gastrointestinal: Soft and nontender. No distention. No abdominal bruits. No CVA tenderness. Genitourinary: deferred Musculoskeletal: No lower extremity tenderness nor edema.  No joint effusions. Mild midline tenderness to palpation throughout the T and L-spine. Neurologic:  Normal speech and language. No gross focal neurologic deficits are appreciated.  Skin:  Skin is warm, dry and intact. No rash noted. Psychiatric: Mood and affect are normal. Speech and behavior are normal.  ____________________________________________   LABS (all labs ordered are listed, but only abnormal results are displayed)  Labs Reviewed  BASIC METABOLIC PANEL - Abnormal; Notable for the following:    Sodium 133 (*)    Chloride 95 (*)    Glucose, Bld 112 (*)    BUN 35 (*)    Creatinine, Ser 1.62 (*)    Calcium 8.6 (*)    GFR calc non Af Amer 29 (*)    GFR calc Af Amer 33 (*)    All other components within normal limits  CBC - Abnormal; Notable for the following:    RBC 3.71 (*)    Hemoglobin 11.2 (*)    HCT 33.3 (*)    RDW 18.3 (*)    All other components within normal limits  TROPONIN I - Abnormal; Notable for the following:    Troponin I 0.06 (*)    All other components within normal limits  URINALYSIS COMPLETEWITH MICROSCOPIC ([facility]  ONLY)   ____________________________________________  EKG  ED ECG REPORT I, Joanne Gavel, the attending physician, personally viewed and interpreted this ECG.   Date: 05/08/2015  EKG Time: 18:53  Rate: 92  Rhythm: normal sinus rhythm  Axis: Left  Intervals:none  ST&T Change: No acute ST elevation. Q waves in lead V1, V2, V3, V4, V5, V6. EKG unchanged from prior.  ____________________________________________  RADIOLOGY  FINDINGS: Single lead left-sided pacemaker remains in place. Cardiomediastinal contours are unchanged, heart at the upper limits of normal in size. Mild right hilar prominence again seen. The small pulmonary nodules on recent CT are not well seen radiographically. Scarring in the right midlung zone is again seen. No pulmonary edema, confluent airspace disease, large pleural effusion or pneumothorax. The bones are under mineralized with mild degenerative change in the spine.  IMPRESSION: Unchanged radiographic appearance of the chest.  Thoracic spine xrays IMPRESSION: No acute abnormality or interval change.   Lumbar spine xrays IMPRESSION: Disc degeneration at L4-5. No fracture or metastatic disease identified.  ____________________________________________   PROCEDURES  Procedure(s) performed: None  Critical Care performed: No  ____________________________________________   INITIAL IMPRESSION / ASSESSMENT AND PLAN / ED COURSE  Pertinent labs & imaging results that were available during my care of the patient were reviewed by me and considered in my medical decision making (see chart for details).  Lauren Best is a 81 y.o. female with stage IV metastatic breast cancer on oral chemotherapy with metastases to liver, lung, on oral chemotherapy who presents for evaluation of several weeks of worsening back pain and aching chest pain. On exam, she is chronically ill and fatigued-appearing but in no acute distress. Her vital  signs are stable,  she is afebrile. She has mild tenderness to palpation throughout the T and L-spine so will obtain plain films. Labs notable for chronic kidney disease, mild stable anemia. Her troponin is 0.06 which is mildly increased from prior. EKG is unchanged from prior. Her troponin has not been this elevated in the past and her symptoms may be cardiac in nature however my concern is that they are likely related to her worsening cancer. We'll treat her pain. Chest x-ray unchanged from prior. She underwent CTA chest and month ago when I saw her for worsening shortness of breath and cough which was negative for PE. I doubt acute aortic dissection or pulmonary embolism in this patient.  ----------------------------------------- 9:58 PM on 05/08/2015 ----------------------------------------- Despite morphine and fentanyl, the patient is reporting continued pain and does not feel she is able to go home. Case discussed with hospitalist, Dr. Nadara Mustard for admission at this time. plain films negative for any acute fracture.   ____________________________________________   FINAL CLINICAL IMPRESSION(S) / ED DIAGNOSES  Final diagnoses:  Chest pain, unspecified chest pain type  NSTEMI (non-ST elevated myocardial infarction)  Midline back pain, unspecified location      Joanne Gavel, MD 05/08/15 2200

## 2015-05-08 NOTE — H&P (Signed)
[facility] [place] NAME: Lauren Best    MR#:  [id number]  DATE OF BIRTH:  June 12, 1933   DATE OF ADMISSION:  05/08/2015  PRIMARY CARE PHYSICIAN: Dion Body, MD   REQUESTING/REFERRING PHYSICIAN: Edd Fabian  CHIEF COMPLAINT:   Chief Complaint  Patient presents with  . Back Pain  . Chest Pain    HISTORY OF PRESENT ILLNESS:  Lauren Best  is a 81 y.o. female with a known history of stage IV breast cancer with known metastases to lung and liver presenting with pain. She describes chest pain which has been chronic for approximately 2 months total duration with associated cough nonproductive denies fevers or chills. Occasional shortness of breath although this is unchanged 2 months total duration. She states she simply "can't take it anymore". In the emergency department despite multiple doses and medications states her pain is essentially the same  PAST MEDICAL HISTORY:   Past Medical History  Diagnosis Date  . Diabetes mellitus without complication   . Hypertension   . Coronary artery disease   . Cancer   . CHF (congestive heart failure)   . AICD (automatic cardioverter/defibrillator) present   . Shortness of breath dyspnea   . Breast cancer right mastectomy    PAST SURGICAL HISTORY:   Past Surgical History  Procedure Laterality Date  . Cardiac defibrillator placement Left   . Mastectomy Right   . Joint replacement Right     knee    SOCIAL HISTORY:   Social History  Substance Use Topics  . Smoking status: Never Smoker   . Smokeless tobacco: Never Used  . Alcohol Use: No    FAMILY HISTORY:   Family History  Problem Relation Age of Onset  . Alzheimer's disease Mother   . Congestive Heart Failure Father     DRUG ALLERGIES:   Allergies  Allergen Reactions  . Metformin Nausea And Vomiting  . Codeine Rash  . Eliquis [Apixaban] Rash and Other (See Comments)    Reaction:  Bleeding     REVIEW OF  SYSTEMS:  REVIEW OF SYSTEMS:  CONSTITUTIONAL: Denies fevers, chills, positive fatigue, weakness.  EYES: Denies blurred vision, double vision, or eye pain.  EARS, NOSE, THROAT: Denies tinnitus, ear pain, hearing loss.  RESPIRATORY: Positive cough, denies shortness of breath, wheezing  CARDIOVASCULAR: Positive chest pain, denies palpitations, edema.  GASTROINTESTINAL: Denies nausea, vomiting, diarrhea, abdominal pain.  GENITOURINARY: Denies dysuria, hematuria.  ENDOCRINE: Denies nocturia or thyroid problems. HEMATOLOGIC AND LYMPHATIC: Denies easy bruising or bleeding.  SKIN: Denies rash or lesions.  MUSCULOSKELETAL: Denies pain in neck, back, shoulder, knees, hips, or further arthritic symptoms.  NEUROLOGIC: Denies paralysis, paresthesias.  PSYCHIATRIC: Denies anxiety or depressive symptoms. Otherwise full review of systems performed by me is negative.   MEDICATIONS AT HOME:   Prior to Admission medications   Medication Sig Start Date End Date Taking? Authorizing Mekenzie Modeste  albuterol (PROVENTIL HFA;VENTOLIN HFA) 108 (90 BASE) MCG/ACT inhaler Inhale 2 puffs into the lungs every 6 (six) hours as needed for wheezing or shortness of breath. 03/04/15  Yes Nance Pear, MD  atorvastatin (LIPITOR) 40 MG tablet Take 40 mg by mouth daily.   Yes Historical Cyndia Degraff, MD  furosemide (LASIX) 20 MG tablet Take 20 mg by mouth daily.   Yes Historical Insiya Oshea, MD  Insulin Lispro Prot & Lispro (HUMALOG 75/25 MIX) (75-25) 100 UNIT/ML Kwikpen Inject 42 Units into the skin 2 (two) times daily.  03/28/15  Yes Historical Dezarai Prew, MD  metoprolol succinate (TOPROL-XL) 25 MG 24 hr tablet Take 12.5 mg by mouth daily.   Yes Historical Telvin Reinders, MD  Omega-3 Fatty Acids (FISH OIL) 1000 MG CAPS Take 1 capsule by mouth daily.   Yes Historical Kadian Barcellos, MD  palbociclib (IBRANCE) 125 MG capsule Take 125 mg by mouth daily with breakfast. Take daily for three weeks and stop for one week. Take whole with food. 05/04/15  Yes  Historical Deya Bigos, MD  vitamin B-12 (CYANOCOBALAMIN) 1000 MCG tablet Take 1,000 mcg by mouth daily.   Yes Historical Mariangela Heldt, MD  Vitamin D, Ergocalciferol, (DRISDOL) 50000 UNITS CAPS capsule Take 50,000 Units by mouth every 7 (seven) days. Pt takes on Monday.   Yes Historical Collyn Selk, MD  benzonatate (TESSALON PERLES) 100 MG capsule Take 1 capsule (100 mg total) by mouth every 6 (six) hours as needed for cough. Patient not taking: Reported on 05/08/2015 03/04/15 03/03/16  Nance Pear, MD  ciprofloxacin (CIPRO) 500 MG tablet Take 1 tablet (500 mg total) by mouth 2 (two) times daily. Patient not taking: Reported on 05/08/2015 04/21/15   Leia Alf, MD  guaiFENesin-dextromethorphan [facility] DM) 100-10 MG/5ML syrup Take 5 mLs by mouth every 4 (four) hours as needed for cough. Patient not taking: Reported on 05/08/2015 03/20/15   Aldean Jewett, MD  living well with diabetes book MISC 1 each by Does not apply route once. Patient not taking: Reported on 05/08/2015 03/21/15   Aldean Jewett, MD  pseudoephedrine-codeine-guaifenesin (CHERATUSSIN DAC) 30-10-100 MG/5ML solution Take 5 - 10 mL orally 4 times a day as needed for cough. Patient not taking: Reported on 05/08/2015 04/18/15   Leia Alf, MD  traMADol (ULTRAM) 50 MG tablet Take 1 tablet (50 mg total) by mouth every 8 (eight) hours as needed. Patient not taking: Reported on 05/08/2015 01/09/15   Victorino Dike, FNP      VITAL SIGNS:  Blood pressure 125/62, pulse 87, temperature 98.3 F (36.8 C), temperature source Oral, resp. rate 20, height 5\' 3"  (1.6 m), weight 150 lb (68.04 kg), SpO2 93 %.  PHYSICAL EXAMINATION:  VITAL SIGNS: Filed Vitals:   05/08/15 2230  BP: 125/62  Pulse: 87  Temp:   Resp: 20   GENERAL:82 y.o.female currently in no acute distress.  HEAD: Normocephalic, atraumatic.  EYES: Pupils equal, round, reactive to light. Extraocular muscles intact. No scleral icterus.  MOUTH: Moist mucosal membrane. Dentition  intact. No abscess noted.  EAR, NOSE, THROAT: Clear without exudates. No external lesions.  NECK: Supple. No thyromegaly. No nodules. No JVD.  PULMONARY: Clear to ascultation, without wheeze rails or rhonci. No use of accessory muscles, Good respiratory effort. good air entry bilaterally CHEST: Nontender to palpation.  CARDIOVASCULAR: S1 and S2. Regular rate and rhythm. 3/6 systolic ejection murmurs, rubs, or gallops. No edema. Pedal pulses 2+ bilaterally.  GASTROINTESTINAL: Soft, nontender, nondistended. No masses. Positive bowel sounds. No hepatosplenomegaly.  MUSCULOSKELETAL: No swelling, clubbing, or edema. Range of motion full in all extremities.  NEUROLOGIC: Cranial nerves II through XII are intact. No gross focal neurological deficits. Sensation intact. Reflexes intact.  SKIN: No ulceration, lesions, rashes, or cyanosis. Skin warm and dry. Turgor intact.  PSYCHIATRIC: Mood, affect within normal limits. The patient is awake, alert and oriented x 3. Insight, judgment intact.    LABORATORY PANEL:   CBC  Recent Labs Lab 05/08/15 1847  WBC 10.6  HGB 11.2*  HCT 33.3*  PLT 351   ------------------------------------------------------------------------------------------------------------------  Chemistries   Recent Labs Lab 05/08/15 1847  NA 133*  K 4.8  CL 95*  CO2 29  GLUCOSE 112*  BUN 35*  CREATININE 1.62*  CALCIUM 8.6*   ------------------------------------------------------------------------------------------------------------------  Cardiac Enzymes  Recent Labs Lab 05/08/15 1847  TROPONINI 0.06*   ------------------------------------------------------------------------------------------------------------------  RADIOLOGY:  Dg Chest 2 View  05/08/2015   CLINICAL DATA:  Cough for 2 months. Mid chest and mid back pain for 3 weeks. Breast cancer with history of metastatic disease to lung and liver.  EXAM: CHEST  2 VIEW  COMPARISON:  Radiographs and chest CT  03/15/2015  FINDINGS: Single lead left-sided pacemaker remains in place. Cardiomediastinal contours are unchanged, heart at the upper limits of normal in size. Mild right hilar prominence again seen. The small pulmonary nodules on recent CT are not well seen radiographically. Scarring in the right midlung zone is again seen. No pulmonary edema, confluent airspace disease, large pleural effusion or pneumothorax. The bones are under mineralized with mild degenerative change in the spine.  IMPRESSION: Unchanged radiographic appearance of the chest.   Electronically Signed   By: Jeb Levering M.D.   On: 05/08/2015 19:38   Dg Thoracic Spine 2 View  05/08/2015   CLINICAL DATA:  Several days of mid and lumbago/low back pain.  EXAM: THORACIC SPINE 2 VIEWS  COMPARISON:  03/15/2015.  FINDINGS: Thoracic spine DISH. No compression fractures are identified. Swimmer's view is submitted for interpretation. The superior aspect of the T1 vertebra is poorly visible because of bony overlap. No gross fracture or malalignment. Paraspinal lines appear within normal limits. LEFT subclavian AICD noted.  IMPRESSION: No acute abnormality or interval change.   Electronically Signed   By: Dereck Ligas M.D.   On: 05/08/2015 21:11   Dg Lumbar Spine 2-3 Views  05/08/2015   CLINICAL DATA:  Back pain.  Breast cancer  EXAM: LUMBAR SPINE - 2-3 VIEW  COMPARISON:  None.  FINDINGS: Negative for fracture or mass.  No lytic or sclerotic mass lesion.  Disc and facet degeneration L4-5 with grade 1 anterior slip of L4 on L5. Remaining disc spaces are normal.  IMPRESSION: Disc degeneration at L4-5. No fracture or metastatic disease identified.   Electronically Signed   By: Franchot Gallo M.D.   On: 05/08/2015 21:09    EKG:   Orders placed or performed during the hospital encounter of 05/08/15  . ED EKG within 10 minutes  . ED EKG within 10 minutes  . EKG 12-Lead  . EKG 12-Lead    IMPRESSION AND PLAN:   81 year old Caucasian female  history of stage IV breast cancer no metastases to liver as well as lung presenting with pain  1. Intractable pain: Most likely secondary to ongoing oncology issues we'll provide pain medication as well as initiate bowel regimen 2. Chest pain, central: Aspirin/statin therapy telemetry cardiac enzymes once again this is unlikely to be cardiac in nature given its 2 months duration 3. Acute kidney injury: IV fluid hydration normal saline avoid further nephrotoxic agent to diuretic 4. Hyponatremia IV fluid hydration normal saline and follow sodium levels 5. Venous thromboembolism prophylactic: Heparin subcutaneous    All the records are reviewed and case discussed with ED Yomar Mejorado. Management plans discussed with the patient, family and they are in agreement.  CODE STATUS: Full  TOTAL TIME TAKING CARE OF THIS PATIENT: 35 minutes.    Hower,  Karenann Cai.D on 05/08/2015 at 11:15 PM  Between 7am to 6pm - Pager - [phone]  After 6pm: House Pager: - [phone]  [facility]  Office  [phone]  CC: Primary care physician; Dion Body, MD

## 2015-05-09 DIAGNOSIS — C50911 Malignant neoplasm of unspecified site of right female breast: Secondary | ICD-10-CM | POA: Diagnosis not present

## 2015-05-09 DIAGNOSIS — Z9011 Acquired absence of right breast and nipple: Secondary | ICD-10-CM

## 2015-05-09 DIAGNOSIS — Z95810 Presence of automatic (implantable) cardiac defibrillator: Secondary | ICD-10-CM

## 2015-05-09 DIAGNOSIS — B3781 Candidal esophagitis: Secondary | ICD-10-CM | POA: Diagnosis not present

## 2015-05-09 DIAGNOSIS — C7800 Secondary malignant neoplasm of unspecified lung: Secondary | ICD-10-CM

## 2015-05-09 DIAGNOSIS — Z170 Estrogen receptor positive status [ER+]: Secondary | ICD-10-CM

## 2015-05-09 DIAGNOSIS — Z79811 Long term (current) use of aromatase inhibitors: Secondary | ICD-10-CM

## 2015-05-09 DIAGNOSIS — I2510 Atherosclerotic heart disease of native coronary artery without angina pectoris: Secondary | ICD-10-CM

## 2015-05-09 DIAGNOSIS — R0602 Shortness of breath: Secondary | ICD-10-CM

## 2015-05-09 DIAGNOSIS — C787 Secondary malignant neoplasm of liver and intrahepatic bile duct: Secondary | ICD-10-CM

## 2015-05-09 DIAGNOSIS — R1011 Right upper quadrant pain: Secondary | ICD-10-CM

## 2015-05-09 DIAGNOSIS — M5136 Other intervertebral disc degeneration, lumbar region: Secondary | ICD-10-CM

## 2015-05-09 DIAGNOSIS — R079 Chest pain, unspecified: Secondary | ICD-10-CM

## 2015-05-09 DIAGNOSIS — I509 Heart failure, unspecified: Secondary | ICD-10-CM

## 2015-05-09 DIAGNOSIS — E119 Type 2 diabetes mellitus without complications: Secondary | ICD-10-CM

## 2015-05-09 DIAGNOSIS — R05 Cough: Secondary | ICD-10-CM

## 2015-05-09 DIAGNOSIS — Z66 Do not resuscitate: Secondary | ICD-10-CM

## 2015-05-09 LAB — GLUCOSE, CAPILLARY
GLUCOSE-CAPILLARY: 128 mg/dL — AB (ref 65–99)
GLUCOSE-CAPILLARY: 14 mg/dL — AB (ref 65–99)
GLUCOSE-CAPILLARY: 73 mg/dL (ref 65–99)
GLUCOSE-CAPILLARY: 93 mg/dL (ref 65–99)
GLUCOSE-CAPILLARY: 98 mg/dL (ref 65–99)
Glucose-Capillary: 19 mg/dL — CL (ref 65–99)
Glucose-Capillary: 22 mg/dL — CL (ref 65–99)
Glucose-Capillary: 69 mg/dL (ref 65–99)

## 2015-05-09 LAB — CBC
HCT: 31.8 % — ABNORMAL LOW (ref 35.0–47.0)
HEMOGLOBIN: 10.7 g/dL — AB (ref 12.0–16.0)
MCH: 30.1 pg (ref 26.0–34.0)
MCHC: 33.8 g/dL (ref 32.0–36.0)
MCV: 89 fL (ref 80.0–100.0)
PLATELETS: 330 10*3/uL (ref 150–440)
RBC: 3.57 MIL/uL — AB (ref 3.80–5.20)
RDW: 18.3 % — ABNORMAL HIGH (ref 11.5–14.5)
WBC: 11.1 10*3/uL — AB (ref 3.6–11.0)

## 2015-05-09 LAB — BASIC METABOLIC PANEL
ANION GAP: 7 (ref 5–15)
BUN: 36 mg/dL — ABNORMAL HIGH (ref 6–20)
CHLORIDE: 105 mmol/L (ref 101–111)
CO2: 25 mmol/L (ref 22–32)
CREATININE: 1.46 mg/dL — AB (ref 0.44–1.00)
Calcium: 8.1 mg/dL — ABNORMAL LOW (ref 8.9–10.3)
GFR calc non Af Amer: 33 mL/min — ABNORMAL LOW (ref >60)
GFR, EST AFRICAN AMERICAN: 38 mL/min — AB (ref >60)
Glucose, Bld: 31 mg/dL — CL (ref 65–99)
POTASSIUM: 4.5 mmol/L (ref 3.5–5.1)
SODIUM: 137 mmol/L (ref 135–145)

## 2015-05-09 LAB — URINALYSIS COMPLETE WITH MICROSCOPIC (ARMC ONLY)
Bacteria, UA: NONE SEEN
Bilirubin Urine: NEGATIVE
GLUCOSE, UA: NEGATIVE mg/dL
HGB URINE DIPSTICK: NEGATIVE
Ketones, ur: NEGATIVE mg/dL
Leukocytes, UA: NEGATIVE
Nitrite: NEGATIVE
Protein, ur: NEGATIVE mg/dL
Specific Gravity, Urine: 1.013 (ref 1.005–1.030)
pH: 5 (ref 5.0–8.0)

## 2015-05-09 LAB — TROPONIN I
TROPONIN I: 0.05 ng/mL — AB (ref <0.031)
TROPONIN I: 0.05 ng/mL — AB (ref <0.031)
TROPONIN I: 0.06 ng/mL — AB (ref <0.031)

## 2015-05-09 MED ORDER — INSULIN ASPART 100 UNIT/ML ~~LOC~~ SOLN
0.0000 [IU] | Freq: Every day | SUBCUTANEOUS | Status: DC
  Administered 2015-05-10: 2 [IU] via SUBCUTANEOUS
  Filled 2015-05-09: qty 2

## 2015-05-09 MED ORDER — BENZONATATE 100 MG PO CAPS: 100.0000 mg | ORAL_CAPSULE | Freq: Four times a day (QID) | ORAL | Status: DC | PRN

## 2015-05-09 MED ORDER — INSULIN DETEMIR 100 UNIT/ML ~~LOC~~ SOLN
31.0000 [IU] | Freq: Two times a day (BID) | SUBCUTANEOUS | Status: DC
  Administered 2015-05-09: 02:00:00 31 [IU] via SUBCUTANEOUS
  Filled 2015-05-09 (×4): qty 0.31

## 2015-05-09 MED ORDER — VITAMIN B-12 1000 MCG PO TABS
1000.0000 ug | ORAL_TABLET | Freq: Every day | ORAL | Status: DC
  Administered 2015-05-09 – 2015-05-11 (×3): 1000 ug via ORAL
  Filled 2015-05-09 (×3): qty 1

## 2015-05-09 MED ORDER — INSULIN ASPART 100 UNIT/ML ~~LOC~~ SOLN
11.0000 [IU] | Freq: Two times a day (BID) | SUBCUTANEOUS | Status: DC
  Administered 2015-05-09: 02:00:00 11 [IU] via SUBCUTANEOUS
  Filled 2015-05-09: qty 11

## 2015-05-09 MED ORDER — OXYCODONE HCL 5 MG PO TABS
5.0000 mg | ORAL_TABLET | ORAL | Status: DC | PRN
  Administered 2015-05-10: 5 mg via ORAL
  Filled 2015-05-09: qty 1

## 2015-05-09 MED ORDER — PALBOCICLIB 125 MG PO CAPS
125.0000 mg | ORAL_CAPSULE | Freq: Every day | ORAL | Status: DC
  Administered 2015-05-10 – 2015-05-11 (×2): 125 mg via ORAL
  Filled 2015-05-09: qty 1

## 2015-05-09 MED ORDER — ENSURE ENLIVE PO LIQD
237.0000 mL | Freq: Three times a day (TID) | ORAL | Status: DC
  Administered 2015-05-09 – 2015-05-10 (×3): 237 mL via ORAL

## 2015-05-09 MED ORDER — ATORVASTATIN CALCIUM 20 MG PO TABS
40.0000 mg | ORAL_TABLET | Freq: Every day | ORAL | Status: DC
  Administered 2015-05-09 – 2015-05-10 (×3): 40 mg via ORAL
  Filled 2015-05-09 (×3): qty 2

## 2015-05-09 MED ORDER — METOPROLOL SUCCINATE ER 25 MG PO TB24
12.5000 mg | ORAL_TABLET | Freq: Every day | ORAL | Status: DC
  Administered 2015-05-09 – 2015-05-10 (×2): 12.5 mg via ORAL
  Filled 2015-05-09 (×3): qty 1

## 2015-05-09 MED ORDER — BENZONATATE 100 MG PO CAPS
100.0000 mg | ORAL_CAPSULE | Freq: Three times a day (TID) | ORAL | Status: DC
  Administered 2015-05-09 – 2015-05-11 (×5): 100 mg via ORAL
  Filled 2015-05-09 (×5): qty 1

## 2015-05-09 MED ORDER — INSULIN LISPRO PROT & LISPRO (75-25 MIX) 100 UNIT/ML KWIKPEN: 42.0000 [IU] | PEN_INJECTOR | Freq: Two times a day (BID) | SUBCUTANEOUS | Status: DC

## 2015-05-09 MED ORDER — ENOXAPARIN SODIUM 30 MG/0.3ML ~~LOC~~ SOLN
30.0000 mg | SUBCUTANEOUS | Status: DC
Start: 2015-05-09 — End: 2015-05-11
  Administered 2015-05-10: 18:00:00 30 mg via SUBCUTANEOUS
  Filled 2015-05-09: qty 0.3

## 2015-05-09 MED ORDER — INSULIN ASPART 100 UNIT/ML ~~LOC~~ SOLN
0.0000 [IU] | Freq: Three times a day (TID) | SUBCUTANEOUS | Status: DC
  Administered 2015-05-10 (×2): 2 [IU] via SUBCUTANEOUS
  Administered 2015-05-11: 3 [IU] via SUBCUTANEOUS
  Administered 2015-05-11: 2 [IU] via SUBCUTANEOUS
  Filled 2015-05-09 (×3): qty 2
  Filled 2015-05-09: qty 3

## 2015-05-09 MED ORDER — HYDROCOD POLST-CPM POLST ER 10-8 MG/5ML PO SUER
5.0000 mL | Freq: Two times a day (BID) | ORAL | Status: DC | PRN
Start: 2015-05-09 — End: 2015-05-11

## 2015-05-09 NOTE — Plan of Care (Signed)
Problem: Discharge Progression Outcomes Goal: Other Discharge Outcomes/Goals Outcome: Progressing Plan of care progress to goal: Pt states pain has resolved since ED VSS Pt has non-productive cough Pt remains in bed.  Foam dressing in place on buttocks and heels prophalactically

## 2015-05-09 NOTE — Progress Notes (Signed)
Inpatient Diabetes Program Recommendations  AACE/ADA: New Consensus Statement on Inpatient Glycemic Control (2015)  Target Ranges:  Prepandial:   less than 140 mg/dL      Peak postprandial:   less than 180 mg/dL (1-2 hours)      Critically ill patients:  140 - 180 mg/dL   Review of Glycemic Control:  Results for ARRIONA, PREST (MRN [id number]) as of 05/09/2015 11:05  Ref. Range 05/09/2015 00:54 05/09/2015 01:08 05/09/2015 07:15 05/09/2015 07:20 05/09/2015 07:36 05/09/2015 09:04  Glucose-Capillary Latest Ref Range: 65-99 mg/dL 128 (H)  14 (LL) 19 (LL) 22 (LL) 73    Diabetes history: Type 2  Outpatient Diabetes medications: Humalog 75/25 42 units bid Current orders for Inpatient glycemic control:  Novolog moderate tid with meals and HS  Inpatient Diabetes Program Recommendations:   Note that Humalog 75/25 not on formulary therefore substitution was made.  Patient received Levemir 31 units at 0212 and Novolog 11 units at 0212.  Blood sugar dropped this morning.  Orders were changed.  Agree with use of only Novolog correction at this time.  May need low dose basal and meal coverage added if blood sugars increase.  Will follow.  Thanks, Adah Perl, RN, BC-ADM Inpatient Diabetes Coordinator Pager [phone] (8a-5p)

## 2015-05-09 NOTE — Evaluation (Signed)
Physical Therapy Evaluation Patient Details Name: Lauren Best MRN: [id number] DOB: 02/13/1933 Today's Date: 05/09/2015   History of Present Illness  Pt is a 81 yo female who was admitted to the hospital with chest pain over the past two months secondary to breast cancer. Pt also has metastases to the liver and lungs  Clinical Impression  Pt presents with hx of DM, HTN, CAD, cancer, CHF, and SOB. Examination reveals that pt performs bed mobility at mod assist, transfers at min assist, and ambulation at min assist. Pt appears to have general weakness and decreased activity tolerance to go along with her acute pain. Pt appears at time to be depressed with her situation and would perhaps benefit from a psych consult. She requires encouragement to complete therapy tasks, but will try activities with encouragement. Pt appreciative of therapy helping her. She will continue to benefit from skilled PT in order to address her deficits and return her to optimal PLOF. This entire session was guided, instructed, and directly supervised by Greggory Stallion, DPT.     Follow Up Recommendations Home health PT;Supervision/Assistance - 24 hour    Equipment Recommendations  None recommended by PT    Recommendations for Other Services  (Psych (possible depression))     Precautions / Restrictions Precautions Precautions: Fall Restrictions Weight Bearing Restrictions: No      Mobility  Bed Mobility Overal bed mobility: Needs Assistance Bed Mobility: Supine to Sit     Supine to sit: Mod assist     General bed mobility comments: Pt requires trunk assist and very minor LE management. Needs cues for hand placement to assist therapist with getting to EOB   Transfers Overall transfer level: Needs assistance Equipment used: Rolling walker (2 wheeled) Transfers: Sit to/from Stand Sit to Stand: Min assist         General transfer comment: Pt needs assist to get up into standing, but shows fair  functional strenght. Needs cues for hand placement prior to transfer.   Ambulation/Gait Ambulation/Gait assistance: Min assist Ambulation Distance (Feet): 5 Feet Assistive device: Rolling walker (2 wheeled) Gait Pattern/deviations: Step-to pattern;Decreased step length - right;Decreased step length - left;Shuffle Gait velocity: significantly decreased Gait velocity interpretation: <1.8 ft/sec, indicative of risk for recurrent falls General Gait Details: Pt performs ambulation with increased time and heavy cues for sequencing of RW with gait. Needs cues to walk within walker. Needs assist for appropriate direction of RW.   Stairs            Wheelchair Mobility    Modified Rankin (Stroke Patients Only)       Balance Overall balance assessment: Needs assistance Sitting-balance support: Bilateral upper extremity supported Sitting balance-Leahy Scale: Good     Standing balance support: Bilateral upper extremity supported Standing balance-Leahy Scale: Fair Standing balance comment: Slight posterior lean                              Pertinent Vitals/Pain Pain Assessment: 0-10 Pain Score: 8  Pain Location: substernal Pain Intervention(s): Limited activity within patient's tolerance;Premedicated before session;Monitored during session    [place] expects to be discharged to:: Private residence Living Arrangements: Spouse/significant other Available Help at Discharge: Family;Available 24 hours/day (husband) Type of Home: House Home Access: Level entry     Home Layout: One level Home Equipment: Walker - 2 wheels      Prior Function Level of Independence: Needs assistance   Gait / Transfers Assistance  Needed: Mod I with household ambulation   ADL's / Homemaking Assistance Needed: indep        Hand Dominance        Extremity/Trunk Assessment   Upper Extremity Assessment: Generalized weakness           Lower Extremity  Assessment: Generalized weakness         Communication   Communication: No difficulties  Cognition Arousal/Alertness: Awake/alert Behavior During Therapy: WFL for tasks assessed/performed Overall Cognitive Status: Within Functional Limits for tasks assessed                      General Comments      Exercises        Assessment/Plan    PT Assessment Patient needs continued PT services  PT Diagnosis Abnormality of gait;Generalized weakness;Difficulty walking;Acute pain   PT Problem List Decreased strength;Decreased activity tolerance;Decreased mobility;Pain  PT Treatment Interventions DME instruction;Gait training;Stair training;Therapeutic activities;Functional mobility training;Therapeutic exercise;Balance training;Neuromuscular re-education;Patient/family education;Wheelchair mobility training;Manual techniques;Modalities   PT Goals (Current goals can be found in the Care Plan section) Acute Rehab PT Goals Patient Stated Goal: to go home PT Goal Formulation: With patient Time For Goal Achievement: 05/23/15 Potential to Achieve Goals: Good    Frequency Min 2X/week   Barriers to discharge        Co-evaluation               End of Session Equipment Utilized During Treatment: Gait belt Activity Tolerance: Patient limited by fatigue Patient left: in chair;with call bell/phone within reach;with chair alarm set;with family/visitor present Nurse Communication: Mobility status         Time: 8675-4492 PT Time Calculation (min) (ACUTE ONLY): 18 min   Charges:         PT G CodesJanyth Contes May 25, 2015, 3:37 PM  Janyth Contes, SPT. [phone]

## 2015-05-09 NOTE — Progress Notes (Signed)
   05/09/15 0915  Clinical Encounter Type  Visited With Patient  Visit Type Initial  Spiritual Encounters  Spiritual Needs Emotional  Provided pastoral presence and support to patient on unit.  [place] [phone]

## 2015-05-09 NOTE — Evaluation (Signed)
Clinical/Bedside Swallow Evaluation Patient Details  Name: TOPAZ RAGLIN MRN: [id number] Date of Birth: 18-Aug-1932  Today's Date: 05/09/2015 Time:12:30 - 1:30        Past Medical History:  Past Medical History  Diagnosis Date  . Diabetes mellitus without complication   . Hypertension   . Coronary artery disease   . Cancer   . CHF (congestive heart failure)   . AICD (automatic cardioverter/defibrillator) present   . Shortness of breath dyspnea   . Breast cancer right mastectomy   Past Surgical History:  Past Surgical History  Procedure Laterality Date  . Cardiac defibrillator placement Left   . Mastectomy Right   . Joint replacement Right     knee   HPI:  Yarisa Lynam is a 81 y.o. female with a known history of stage IV breast cancer with known metastases to lung and liver presenting with pain. She describes chest pain which has been chronic for approximately 2 months total duration with associated cough nonproductive denies fevers or chills. Occasional shortness of breath although this is unchanged 2 months total duration. She states she simply "can't take it anymore". In the emergency department despite multiple doses and medications states her pain is essentially the same.   Assessment / Plan / Recommendation Clinical Impression  Pt presented with suspected esophageal phase dysphagia c/b reported tightness/pain and discomfort in chest during PO trials with ST. Pt has baseline congested coughing also noted intermittently during PO trials given. Coughing and throat clearing noted intermittently; although respiration rate and vocal quality did not decline during PO trials/meal. She reported spitting out chewed food (meat/bread) due to pain and discomfort in chest.  Boluses were expectorated and found on tray. Baseline pt does have wet cough, worse at night than day, and episodes of frothy phlegme per husband's report.  Inconsistent immediate cough post PO trials, suspect pt is  close to her baseline status in this regard. Possible GI involvement as pt. complains of pain in her chest after taking 3 or 4 bites of food; explains pain goes through to her back. Counseled patient on aspiration precautioned and recommended avoiding tough meats and breads at this time. Recommend mechanical soft diet (with ground meats and gravy) with aspiration precautions. NSG updated.    Aspiration Risk  Mild    Diet Recommendation Dysphagia 3 (Mech soft);Thin (ground meats with gravy (if meat is selected by pt from menu)   Medication Administration: Whole meds with liquid Compensations: Slow rate;Small sips/bites;Follow solids with liquid    Other  Recommendations Recommended Consults: Consider GI evaluation;Consider esophageal assessment Oral Care Recommendations: Oral care BID;Patient independent with oral care   Follow Up Recommendations       Frequency and Duration min 2x/week  1 week   Pertinent Vitals/Pain Pt reports pain and discomfort in chest while eating.    SLP Swallow Goals   See plan of care   Swallow Study Prior Functional Status    Pt at home with husband on regular diet prior to admission. Currently receiving chemotherapy.    General Date of Onset: 05/08/15 Other Pertinent Information: Mallie Giambra is a 81 y.o. female with a known history of stage IV breast cancer with known metastases to lung and liver presenting with pain. She describes chest pain which has been chronic for approximately 2 months total duration with associated cough nonproductive denies fevers or chills. Occasional shortness of breath although this is unchanged 2 months total duration. She states she simply "can't take it anymore". In the  emergency department despite multiple doses and medications states her pain is essentially the same. Type of Study: Bedside swallow evaluation Diet Prior to this Study: Dysphagia 3 (soft);Thin liquids (ground meats with gravy) Temperature Spikes Noted:  No Respiratory Status: Room air History of Recent Intubation: No Behavior/Cognition: Alert;Cooperative (Expressed feelings of defeat and saddness) Oral Cavity - Dentition: Adequate natural dentition/normal for age Self-Feeding Abilities: Able to feed self;Needs assist Patient Positioning: Upright in bed Baseline Vocal Quality: Low vocal intensity Volitional Cough:  (NT) Volitional Swallow:  (NT)    Oral/Motor/Sensory Function Overall Oral Motor/Sensory Function: Appears within functional limits for tasks assessed Labial ROM: Within Functional Limits Labial Symmetry: Within Functional Limits Labial Strength:  (NT) Labial Sensation:  (NT) Lingual ROM:  (appears WFL in bolus management) Lingual Symmetry:  (NT) Lingual Strength:  (NT) Lingual Sensation:  (NT) Facial ROM:  (NT) Facial Symmetry: Within Functional Limits Facial Strength:  (Nt) Facial Sensation:  (NT) Velum:  (NT) Mandible:  (appears WFL in managment of bolus)   Ice Chips Ice chips: Impaired Presentation: Spoon Pharyngeal Phase Impairments: Cough - Immediate;Cough - Delayed;Throat Clearing - Delayed Other Comments: immediate/overt s/s of aspiration after ice chip trial   Thin Liquid Thin Liquid: Impaired Presentation: Cup;Straw Pharyngeal  Phase Impairments: Cough - Immediate;Cough - Delayed;Throat Clearing - Delayed Other Comments: On several sips of thin liquid, had intermittent episodes of coughing (husband reports this is common at baseline, especially last few months). Pt drank ~2-3 ounces of water and tea throughout ST visit.    Nectar Thick Nectar Thick Liquid: Not tested   Honey Thick Honey Thick Liquid: Not tested   Puree Puree: Within functional limits Presentation: Self Fed;Spoon Other Comments: Pt. appeared to tolerate 10+ bites of applesauce with no immediate overt s/s of aspiration noted during trials.   Solid   GO    Solid: Impaired Presentation: Self Fed Other Comments: Just prior to ST entering  room, Pt had attempted to eat meat and bread on her lunch tray (burger). She reported it feeling tight and painful in her chest, so much so that she chewed up several bites then felt as if she couldn't swallow them and so spit back out. Suspect possible GI involvement.       Meghan Esper 05/09/2015,2:37 PM

## 2015-05-09 NOTE — Progress Notes (Signed)
Lauren Best at [place] NAME: Lauren Best    MR#:  [id number]  DATE OF BIRTH:  03-19-1933  SUBJECTIVE:  CHIEF COMPLAINT:   Chief Complaint  Patient presents with  . Back Pain  . Chest Pain  dysphagia, cough and weakness. low blood sugar and 14 this morning.  REVIEW OF SYSTEMS:  CONSTITUTIONAL: No fever,generalized weakness.  EYES: No blurred or double vision.  EARS, NOSE, AND THROAT: No tinnitus or ear pain. Dysphagia. RESPIRATORY: No cough, shortness of breath, wheezing or hemoptysis.  CARDIOVASCULAR: No chest pain, orthopnea, edema.  GASTROINTESTINAL: No nausea, vomiting, diarrhea or abdominal pain. Substernal chest pain with eating. GENITOURINARY: No dysuria, hematuria.  ENDOCRINE: No polyuria, nocturia,  HEMATOLOGY: No anemia, easy bruising or bleeding SKIN: No rash or lesion. MUSCULOSKELETAL: No joint pain or arthritis.   NEUROLOGIC: No tingling, numbness, weakness.  PSYCHIATRY: No anxiety or depression.   DRUG ALLERGIES:   Allergies  Allergen Reactions  . Metformin Nausea And Vomiting  . Codeine Rash  . Eliquis [Apixaban] Rash and Other (See Comments)    Reaction:  Bleeding     VITALS:  Blood pressure 119/42, pulse 96, temperature 98.4 F (36.9 C), temperature source Oral, resp. rate 18, height 5\' 4"  (1.626 m), weight 71.442 kg (157 lb 8 oz), SpO2 93 %.  PHYSICAL EXAMINATION:  GENERAL:  81 y.o.-year-old patient lying in the bed with no acute distress.  EYES: Pupils equal, round, reactive to light and accommodation. No scleral icterus. Extraocular muscles intact.  HEENT: Head atraumatic, normocephalic. Oropharynx and nasopharynx clear. Moist oral mucosa. NECK:  Supple, no jugular venous distention. No thyroid enlargement, no tenderness.  LUNGS: Normal breath sounds bilaterally, no wheezing, rales,rhonchi or crepitation. No use of accessory muscles of respiration.  CARDIOVASCULAR: S1, S2 normal. No murmurs, rubs,  or gallops.  ABDOMEN: Soft, nontender, nondistended. Bowel sounds present. No organomegaly or mass.  EXTREMITIES: No pedal edema, cyanosis, or clubbing.  NEUROLOGIC: Cranial nerves II through XII are intact. Muscle strength 4/5 in all extremities. Sensation intact. Gait not checked.  PSYCHIATRIC: The patient is alert and oriented x 3.  SKIN: No obvious rash, lesion, or ulcer.    LABORATORY PANEL:   CBC  Recent Labs Lab 05/09/15 0736  WBC 11.1*  HGB 10.7*  HCT 31.8*  PLT 330   ------------------------------------------------------------------------------------------------------------------  Chemistries   Recent Labs Lab 05/09/15 0736  NA 137  K 4.5  CL 105  CO2 25  GLUCOSE 31*  BUN 36*  CREATININE 1.46*  CALCIUM 8.1*   ------------------------------------------------------------------------------------------------------------------  Cardiac Enzymes  Recent Labs Lab 05/09/15 0736  TROPONINI 0.05*   ------------------------------------------------------------------------------------------------------------------  RADIOLOGY:  Dg Chest 2 View  05/08/2015   CLINICAL DATA:  Cough for 2 months. Mid chest and mid back pain for 3 weeks. Breast cancer with history of metastatic disease to lung and liver.  EXAM: CHEST  2 VIEW  COMPARISON:  Radiographs and chest CT 03/15/2015  FINDINGS: Single lead left-sided pacemaker remains in place. Cardiomediastinal contours are unchanged, heart at the upper limits of normal in size. Mild right hilar prominence again seen. The small pulmonary nodules on recent CT are not well seen radiographically. Scarring in the right midlung zone is again seen. No pulmonary edema, confluent airspace disease, large pleural effusion or pneumothorax. The bones are under mineralized with mild degenerative change in the spine.  IMPRESSION: Unchanged radiographic appearance of the chest.   Electronically Signed   By: Jeb Levering M.D.   On:  05/08/2015 19:38    Dg Thoracic Spine 2 View  05/08/2015   CLINICAL DATA:  Several days of mid and lumbago/low back pain.  EXAM: THORACIC SPINE 2 VIEWS  COMPARISON:  03/15/2015.  FINDINGS: Thoracic spine DISH. No compression fractures are identified. Swimmer's view is submitted for interpretation. The superior aspect of the T1 vertebra is poorly visible because of bony overlap. No gross fracture or malalignment. Paraspinal lines appear within normal limits. LEFT subclavian AICD noted.  IMPRESSION: No acute abnormality or interval change.   Electronically Signed   By: Dereck Ligas M.D.   On: 05/08/2015 21:11   Dg Lumbar Spine 2-3 Views  05/08/2015   CLINICAL DATA:  Back pain.  Breast cancer  EXAM: LUMBAR SPINE - 2-3 VIEW  COMPARISON:  None.  FINDINGS: Negative for fracture or mass.  No lytic or sclerotic mass lesion.  Disc and facet degeneration L4-5 with grade 1 anterior slip of L4 on L5. Remaining disc spaces are normal.  IMPRESSION: Disc degeneration at L4-5. No fracture or metastatic disease identified.   Electronically Signed   By: Franchot Gallo M.D.   On: 05/08/2015 21:09    EKG:   Orders placed or performed during the hospital encounter of 05/08/15  . ED EKG within 10 minutes  . ED EKG within 10 minutes  . EKG 12-Lead  . EKG 12-Lead    ASSESSMENT AND PLAN:   1. Intractable pain, history ofbreast cancer with metastasis:   Bettercontrolled, continue pain control and follow-up oncologist.  2. Chest pain, central: Aspirin/statin therapy.  3. Acute kidney injury: IV fluid hydration normal salineand a follow-up BMP. 4. Hyponatremia.improved with IV fluid hydration normal saline.   * severe hypoglycemia, the patient's blood sugar decreased to 14 reported this morning and improved to 73 after treatment.hypoglycemia is possibly due to poor oral intake and insulin. I discontinued Levemir and NovoLog twice a day this morning, continue sliding scale.  *substernal chest pain while eating,  Per speech  study staff, she has no dysphagia but complains of substernal pain while eating, she only eats one quarter of the food. I will request GI consult to rule out esophagitis or stricture.  All the records are reviewed and case discussed with Care Management/Social Workerr. Management plans discussed with the patient, family and they are in agreement.  CODE STATUS: full code  TOTAL TIME TAKING CARE OF THIS PATIENT: 42 minutes.   POSSIBLE D/C IN 2 DAYS, DEPENDING ON CLINICAL CONDITION.   Demetrios Loll M.D on 05/09/2015 at 1:03 PM  Between 7am to 6pm - Pager - [phone]  After 6pm go to [email or web address] - password [email or web address] [facility]  Office  [phone]  CC: Primary care physician; Dion Body, MD

## 2015-05-09 NOTE — Progress Notes (Signed)
Initial Nutrition Assessment   INTERVENTION:   Meals and Snacks: Cater to patient preferences, will recommend Dysphagia III diet order as pt complains of dysphagia and wanting softer foods Medical Food Supplement Therapy: will recommend Ensure Enlive po BID, each supplement provides 350 kcal and 20 grams of protein Coordination of Care: if pt at risk for aspiration and/or with c/o difficulty swallowing, recommend SLP evaulation. RN Merry Proud to address with MD   NUTRITION DIAGNOSIS:   Inadequate oral intake related to chronic illness, poor appetite as evidenced by per patient/family report.  GOAL:   Patient will meet greater than or equal to 90% of their needs  MONITOR:    (Energy Intake, Anthropometrics, Digestive system, Glucose Profile)  REASON FOR ASSESSMENT:   Malnutrition Screening Tool    ASSESSMENT:   Pt admitted with CP, difficulty swallowing with h/o stage IV breast cancer with mets to liver and lung.   Past Medical History  Diagnosis Date  . Diabetes mellitus without complication   . Hypertension   . Coronary artery disease   . Cancer   . CHF (congestive heart failure)   . AICD (automatic cardioverter/defibrillator) present   . Shortness of breath dyspnea   . Breast cancer right mastectomy   Diet Order:  DIET DYS 3 Room service appropriate?: Yes; Fluid consistency:: Thin    Current Nutrition: Pt reports eating 25% of eggs this am. Per RN Merry Proud pt had quite a bit of orange juice this am secondary to low blood sugars.  Food/Nutrition-Related History: Pt reports poor po intake for months PTA secondary to poor appetite and difficulty swallowing. Pt reports eating about 50% of what she normally is used to eating. Pt reports drinking Ensure daily, likes chocolate.    Medications: Colace, Novolog, vitamin B12, NS at 7mL/hr  Electrolyte/Renal Profile and Glucose Profile:   Recent Labs Lab 05/08/15 1847 05/09/15 0736  NA 133* 137  K 4.8 4.5  CL 95* 105  CO2  29 25  BUN 35* 36*  CREATININE 1.62* 1.46*  CALCIUM 8.6* 8.1*  GLUCOSE 112* 31*   Protein Profile: No results for input(s): ALBUMIN in the last 168 hours.  Gastrointestinal Profile: Last BM: 05/08/2015   Nutrition-Focused Physical Exam Findings: Nutrition-Focused physical exam completed. Findings are WDL for fat depletion, muscle depletion, and edema.    Weight Change: Pt reports weighing 160lbs a couple of months ago and thinking now she was 150lbs. Current measured weight documented of 157lbs.  Per [facility] 4% weight loss in 2 months.  Skin:  Reviewed, no issues   Height:   Ht Readings from Last 1 Encounters:  05/09/15 5\' 4"  (1.626 m)    Weight:   Wt Readings from Last 1 Encounters:  05/09/15 157 lb 8 oz (71.442 kg)    Wt Readings from Last 10 Encounters:  05/09/15 157 lb 8 oz (71.442 kg)  04/18/15 155 lb 13.8 oz (70.7 kg)  03/30/15 154 lb 15.7 oz (70.3 kg)  03/27/15 150 lb (68.04 kg)  03/21/15 160 lb 11.2 oz (72.893 kg)  03/04/15 163 lb (73.936 kg)  01/31/15 163 lb (73.936 kg)  01/09/15 163 lb (73.936 kg)    BMI:  Body mass index is 27.02 kg/(m^2).  Estimated Nutritional Needs:   Kcal:  BEE: 1165kcals, TEE: (IF 1.1-1.3)(AF 1.2) 1538-1837kcals  Protein:  78-92g protein (1.1-1.3g/kg)   Fluid:  1775-2162mL of fluid (25-65mL/kg)   EDUCATION NEEDS:   No education needs identified at this time   [place], RD, LDN  Pager [phone]

## 2015-05-09 NOTE — Progress Notes (Signed)
Physical Therapy Treatment Patient Details Name: Lauren Best MRN: [id number] DOB: 10/28/1932 Today's Date: 05/09/2015    History of Present Illness Pt is a 81 yo female who was admitted to the hospital with chest pain over the past two months secondary to breast cancer. Pt also has metastases to the liver and lungs    PT Comments     Pt presents with hx of DM, HTN, CAD, cancer, CHF, and SOB. Examination reveals that pt performs bed mobility at mod assist, transfers at min assist, and ambulation at min assist. Pt appears to have general weakness and decreased activity tolerance to go along with her acute pain. Pt appears at time to be depressed with her situation and would perhaps benefit from a psych consult. She requires encouragement to complete therapy tasks, but will try activities with encouragement. Pt appreciative of therapy helping her. She will continue to benefit from skilled PT in order to address her deficits and return her to optimal PLOF. This entire session was guided, instructed, and directly supervised by Greggory Stallion, DPT.         Follow Up Recommendations  Home health PT;Supervision/Assistance - 24 hour     Equipment Recommendations  None recommended by PT    Recommendations for Other Services  (Psych (possible depression))     Precautions / Restrictions Precautions Precautions: Fall Restrictions Weight Bearing Restrictions: No    Mobility  Bed Mobility Overal bed mobility: Needs Assistance Bed Mobility: Supine to Sit     Supine to sit: Mod assist     General bed mobility comments: Pt requires trunk assist and very minor LE management. Needs cues for hand placement to assist therapist with getting to EOB   Transfers Overall transfer level: Needs assistance Equipment used: Rolling walker (2 wheeled) Transfers: Sit to/from Stand Sit to Stand: Min assist         General transfer comment: Pt needs assist to get up into standing, but shows fair  functional strenght. Needs cues for hand placement prior to transfer.   Ambulation/Gait Ambulation/Gait assistance: Min assist Ambulation Distance (Feet): 5 Feet Assistive device: Rolling walker (2 wheeled) Gait Pattern/deviations: Step-to pattern;Decreased step length - right;Decreased step length - left;Shuffle Gait velocity: significantly decreased Gait velocity interpretation: <1.8 ft/sec, indicative of risk for recurrent falls General Gait Details: Pt performs ambulation with increased time and heavy cues for sequencing of RW with gait. Needs cues to walk within walker. Needs assist for appropriate direction of RW.    Stairs            Wheelchair Mobility    Modified Rankin (Stroke Patients Only)       Balance Overall balance assessment: Needs assistance Sitting-balance support: Bilateral upper extremity supported Sitting balance-Leahy Scale: Good     Standing balance support: Bilateral upper extremity supported Standing balance-Leahy Scale: Fair Standing balance comment: Slight posterior lean                     Cognition Arousal/Alertness: Awake/alert Behavior During Therapy: WFL for tasks assessed/performed Overall Cognitive Status: Within Functional Limits for tasks assessed                      Exercises      General Comments        Pertinent Vitals/Pain Pain Assessment: 0-10 Pain Score: 8  Pain Location: substernal Pain Intervention(s): Limited activity within patient's tolerance;Premedicated before session;Monitored during session    Home Living Family/patient expects to be  discharged to:: Private residence Living Arrangements: Spouse/significant other Available Help at Discharge: Family;Available 24 hours/day (husband) Type of Home: House Home Access: Level entry   Home Layout: One level Home Equipment: Walker - 2 wheels      Prior Function Level of Independence: Needs assistance  Gait / Transfers Assistance Needed: Mod I  with household ambulation  (No greater than 10 ft amb at a time prior ) ADL's / Homemaking Assistance Needed: indep     PT Goals (current goals can now be found in the care plan section) Acute Rehab PT Goals Patient Stated Goal: to go home PT Goal Formulation: With patient Time For Goal Achievement: 05/23/15 Potential to Achieve Goals: Good    Frequency  Min 2X/week    PT Plan      Co-evaluation             End of Session Equipment Utilized During Treatment: Gait belt Activity Tolerance: Patient limited by fatigue Patient left: in chair;with call bell/phone within reach;with chair alarm set;with family/visitor present     Time: 5929-2446 PT Time Calculation (min) (ACUTE ONLY): 18 min  Charges:                       G Codes:  Functional Assessment Tool Used: clinical judgement Functional Limitation: Mobility: Walking and moving around Mobility: Walking and Moving Around Current Status ([id number]): At least 1 percent but less than 20 percent impaired, limited or restricted Mobility: Walking and Moving Around Goal Status [phone]): 0 percent impaired, limited or restricted   [facility] 05/09/2015, 4:27 PM  Janyth Contes, SPT. [phone]

## 2015-05-10 ENCOUNTER — Observation Stay: Payer: Medicare Other | Admitting: Anesthesiology

## 2015-05-10 ENCOUNTER — Encounter: Payer: Self-pay | Admitting: *Deleted

## 2015-05-10 ENCOUNTER — Encounter
Admission: EM | Disposition: A | Discharge status: Home / Self Care | Payer: Self-pay | Source: Home / Self Care | Attending: Student

## 2015-05-10 DIAGNOSIS — R1314 Dysphagia, pharyngoesophageal phase: Secondary | ICD-10-CM

## 2015-05-10 DIAGNOSIS — K2970 Gastritis, unspecified, without bleeding: Secondary | ICD-10-CM | POA: Diagnosis not present

## 2015-05-10 DIAGNOSIS — K209 Esophagitis, unspecified without bleeding: Secondary | ICD-10-CM | POA: Insufficient documentation

## 2015-05-10 DIAGNOSIS — B3781 Candidal esophagitis: Secondary | ICD-10-CM | POA: Diagnosis not present

## 2015-05-10 DIAGNOSIS — R52 Pain, unspecified: Secondary | ICD-10-CM | POA: Diagnosis not present

## 2015-05-10 DIAGNOSIS — R1310 Dysphagia, unspecified: Secondary | ICD-10-CM | POA: Diagnosis not present

## 2015-05-10 HISTORY — PX: ESOPHAGOGASTRODUODENOSCOPY (EGD) WITH PROPOFOL: SHX5813

## 2015-05-10 LAB — BASIC METABOLIC PANEL
Anion gap: 8 (ref 5–15)
BUN: 32 mg/dL — ABNORMAL HIGH (ref 6–20)
CALCIUM: 7.7 mg/dL — AB (ref 8.9–10.3)
CO2: 26 mmol/L (ref 22–32)
CREATININE: 1.52 mg/dL — AB (ref 0.44–1.00)
Chloride: 100 mmol/L — ABNORMAL LOW (ref 101–111)
GFR calc non Af Amer: 31 mL/min — ABNORMAL LOW (ref >60)
GFR, EST AFRICAN AMERICAN: 36 mL/min — AB (ref >60)
Glucose, Bld: 88 mg/dL (ref 65–99)
Potassium: 4.9 mmol/L (ref 3.5–5.1)
Sodium: 134 mmol/L — ABNORMAL LOW (ref 135–145)

## 2015-05-10 LAB — GLUCOSE, CAPILLARY
GLUCOSE-CAPILLARY: 194 mg/dL — AB (ref 65–99)
GLUCOSE-CAPILLARY: 158 mg/dL — AB (ref 65–99)
Glucose-Capillary: 93 mg/dL (ref 65–99)
Glucose-Capillary: 239 mg/dL — ABNORMAL HIGH (ref 65–99)

## 2015-05-10 LAB — KOH PREP: KOH PREP: POSITIVE

## 2015-05-10 SURGERY — ESOPHAGOGASTRODUODENOSCOPY (EGD) WITH PROPOFOL
Anesthesia: General

## 2015-05-10 MED ORDER — PANTOPRAZOLE SODIUM 40 MG PO TBEC
40.0000 mg | DELAYED_RELEASE_TABLET | Freq: Two times a day (BID) | ORAL | Status: DC
  Administered 2015-05-10 – 2015-05-11 (×2): 40 mg via ORAL
  Filled 2015-05-10 (×2): qty 1

## 2015-05-10 MED ORDER — SODIUM CHLORIDE 0.9 % IV SOLN
INTRAVENOUS | Status: DC
  Administered 2015-05-10: 1000 mL via INTRAVENOUS

## 2015-05-10 MED ORDER — PANTOPRAZOLE SODIUM 40 MG PO TBEC: 40.0000 mg | DELAYED_RELEASE_TABLET | Freq: Every day | ORAL | Status: DC

## 2015-05-10 MED ORDER — ETOMIDATE 2 MG/ML IV SOLN
INTRAVENOUS | Status: DC | PRN
  Administered 2015-05-10: 10 mg via INTRAVENOUS
  Administered 2015-05-10: 4 mg via INTRAVENOUS

## 2015-05-10 NOTE — Anesthesia Preprocedure Evaluation (Addendum)
Anesthesia Evaluation  Patient identified by MRN, date of birth, ID band Patient awake    Reviewed: Allergy & Precautions, H&P , NPO status , Patient's Chart, lab work & pertinent test results  History of Anesthesia Complications Negative for: history of anesthetic complications  Airway Mallampati: III  TM Distance: >3 FB Neck ROM: limited    Dental  (+) Poor Dentition, Chipped, Missing   Pulmonary shortness of breath,    Pulmonary exam normal breath sounds clear to auscultation       Cardiovascular hypertension, + CAD, + Past MI and +CHF  Normal cardiovascular exam+ Cardiac Defibrillator  Rhythm:regular Rate:Normal     Neuro/Psych negative neurological ROS  negative psych ROS   GI/Hepatic negative GI ROS, Neg liver ROS,   Endo/Other  diabetes, Type 2  Renal/GU negative Renal ROS  negative genitourinary   Musculoskeletal  (+) Arthritis ,   Abdominal   Peds  Hematology negative hematology ROS (+)   Anesthesia Other Findings Past Medical History:   Diabetes mellitus without complication                       Hypertension                                                 Coronary artery disease                                      Cancer                                                       CHF (congestive heart failure)                               AICD (automatic cardioverter/defibrillator) pr*              Shortness of breath dyspnea                                  Breast cancer                                   right mas*  Patient reports that she does that think that any food or pills are stuck in her throat at this time.   Reproductive/Obstetrics negative OB ROS                           Anesthesia Physical Anesthesia Plan  ASA: IV  Anesthesia Plan: General   Post-op Pain Management:    Induction:   Airway Management Planned:   Additional Equipment:   Intra-op Plan:    Post-operative Plan:   Informed Consent: I have reviewed the patients History and Physical, chart, labs and discussed the procedure including the risks, benefits and alternatives for the proposed anesthesia with the patient or authorized representative who has indicated his/her understanding and  acceptance.   Dental Advisory Given  Plan Discussed with: Anesthesiologist, CRNA and Surgeon  Anesthesia Plan Comments: (Patient informed that they are higher risk for complications from anesthesia during this procedure due to their medical history.  Patient voiced understanding. )       Anesthesia Quick Evaluation

## 2015-05-10 NOTE — Anesthesia Postprocedure Evaluation (Signed)
  Anesthesia Post-op Note  Patient: Lauren Best  Procedure(s) Performed: Procedure(s): ESOPHAGOGASTRODUODENOSCOPY (EGD) WITH PROPOFOL (N/A)  Anesthesia type:General  Patient location: PACU  Post pain: Pain level controlled  Post assessment: Post-op Vital signs reviewed, Patient's Cardiovascular Status Stable, Respiratory Function Stable, Patent Airway and No signs of Nausea or vomiting  Post vital signs: Reviewed and stable  Last Vitals:  Filed Vitals:   05/10/15 2045  BP: 105/52  Pulse: 85  Temp: 36.6 C  Resp:     Level of consciousness: awake, alert  and patient cooperative  Complications: No apparent anesthesia complications

## 2015-05-10 NOTE — Progress Notes (Signed)
Pt noted with o2 sat of 72% on return from endo, pt a/o x 4, no distress noted, o2 at 2lpm applied sat increased to 92%.  Dr. Bridgett Larsson and dr. Ether Griffins attempted to be notified, spoke with dr. Ether Griffins, orders to d/c discharge and keep o2 given.  Will monitor.

## 2015-05-10 NOTE — Discharge Instructions (Signed)
Heart healthy and ADA diet. °Activity as tolerated. °

## 2015-05-10 NOTE — Transfer of Care (Signed)
Immediate Anesthesia Transfer of Care Note  Patient: Lauren Best  Procedure(s) Performed: Procedure(s): ESOPHAGOGASTRODUODENOSCOPY (EGD) WITH PROPOFOL (N/A)  Patient Location: PACU and Endoscopy Unit  Anesthesia Type:General  Level of Consciousness: sedated and responds to stimulation  Airway & Oxygen Therapy: Patient Spontanous Breathing and Patient connected to nasal cannula oxygen  Post-op Assessment: Report given to RN and Post -op Vital signs reviewed and stable  Post vital signs: Reviewed and stable  Last Vitals:  Filed Vitals:   05/10/15 1627  BP: 94/50  Pulse: 78  Temp: 36.2 C  Resp: 21    Complications: No apparent anesthesia complications

## 2015-05-10 NOTE — Plan of Care (Signed)
Problem: Discharge Progression Outcomes Goal: Other Discharge Outcomes/Goals Outcome: Progressing Plan of care progress to goal: No pain complaints VSS pts glucose levels WNL tonight Tolerating diet Pt remains in bed at night, but is working with PT

## 2015-05-10 NOTE — Consult Note (Signed)
ONCOLOGY CONSULTATION NOTE -   Reason for Consultation: metastatic breast cancer, pain.   HISTORY OF PRESENT ILLNESS:  HISTORY OF PRESENT ILLNESS: Lauren Best is a 81 year old female with known stage IV breast cancer with known metastases to lung and liver. She is currently on combination treatment with faslodex and ibrance which she started recently. States she has been tolerating this well so far. She is admitted with progressive pain in right upper abdomen and intermittently in mid-chest, also has bothersome cough but no sputum or hemoptysis. Denies fevers or chills. Occasional shortness of breath. No new bowel or bladder symptoms.  PAST MEDICAL HISTORY:   Past Medical History  Diagnosis   . Diabetes mellitus without complication   . Hypertension   . Coronary artery disease   . Cancer   . CHF (congestive heart failure)   . AICD (automatic cardioverter/defibrillator) present   . Shortness of breath dyspnea   . Breast cancer right mastectomy    PAST SURGICAL HISTORY:   Past Surgical History  Procedure Laterality Date  . Cardiac defibrillator placement Left   . Mastectomy Right   . Joint replacement Right     knee    SOCIAL HISTORY:   Social History  Substance Use Topics  . Smoking status: Never Smoker   . Smokeless tobacco: Never Used  . Alcohol Use: No    FAMILY HISTORY:   Family History  Problem Relation Age of Onset  . Alzheimer's disease Mother   . Congestive Heart Failure Father     DRUG ALLERGIES:   Allergies  Allergen Reactions  . Metformin Nausea And Vomiting  . Codeine Rash  . Eliquis [Apixaban] Rash and Other (See Comments)    Reaction: Bleeding     REVIEW OF SYSTEMS:  REVIEW OF SYSTEMS:  CONSTITUTIONAL: Denies fevers, chills, positive fatigue, weakness.  HEENT: Denies headache, epistaxis, ear or jaw pain.   LUNGS: Positive  cough, denies shortness of breath, wheezing  CARDIOVASCULAR: Positive chest pain, denies palpitations, edema.  GASTROINTESTINAL: Denies nausea, vomiting, diarrhea, abdominal pain.  GENITOURINARY: Denies dysuria, hematuria.  HEMATOLOGIC: Denies easy bruising or bleeding.  SKIN: Denies rash or lesions.  MUSCULOSKELETAL: Denies new bone pain. NEUROLOGIC: Denies paralysis, paresthesias.  PSYCHIATRIC: Denies anxiety or depressive symptoms.  MEDICATIONS AT HOME:   Prior to Admission medications   Medication Sig      albuterol (PROVENTIL HFA;VENTOLIN HFA) 108 (90 BASE) MCG/ACT inhaler Inhale 2 puffs into the lungs every 6 (six) hours as needed for wheezing or shortness of breath.      atorvastatin (LIPITOR) 40 MG tablet Take 40 mg by mouth daily.      furosemide (LASIX) 20 MG tablet Take 20 mg by mouth daily.      Insulin Lispro Prot & Lispro (HUMALOG 75/25 MIX) (75-25) 100 UNIT/ML Kwikpen Inject 42 Units into the skin 2 (two) times daily.       metoprolol succinate (TOPROL-XL) 25 MG 24 hr tablet Take 12.5 mg by mouth daily.      Omega-3 Fatty Acids (FISH OIL) 1000 MG CAPS Take 1 capsule by mouth daily.      palbociclib (IBRANCE) 125 MG capsule Take 125 mg by mouth daily with breakfast. Take daily for three weeks and stop for one week. Take whole with food.      vitamin B-12 (CYANOCOBALAMIN) 1000 MCG tablet Take 1,000 mcg by mouth daily.      Vitamin D, Ergocalciferol, (DRISDOL) 50000 UNITS CAPS capsule Take 50,000 Units by mouth every 7 (seven)  days. Pt takes on Monday.      benzonatate (TESSALON PERLES) 100 MG capsule Take 1 capsule (100 mg total) by mouth every 6 (six) hours as needed for cough. Patient not taking: Reported on 05/08/2015      ciprofloxacin (CIPRO) 500 MG tablet Take 1 tablet (500 mg total) by mouth 2 (two) times daily. Patient not taking: Reported on 05/08/2015      guaiFENesin-dextromethorphan (ROBITUSSIN DM) 100-10 MG/5ML syrup Take 5 mLs  by mouth every 4 (four) hours as needed for cough. Patient not taking: Reported on 05/08/2015      living well with diabetes book MISC 1 each by Does not apply route once. Patient not taking: Reported on 05/08/2015      pseudoephedrine-codeine-guaifenesin (CHERATUSSIN DAC) 30-10-100 MG/5ML solution Take 5 - 10 mL orally 4 times a day as needed for cough. Patient not taking: Reported on 05/08/2015      traMADol (ULTRAM) 50 MG tablet Take 1 tablet (50 mg total) by mouth every 8 (eight) hours as needed. Patient not taking: Reported on 05/08/2015         PHYSICAL EXAM: Vitals - 99.4, 87, 20, 96/40, 91% GENERAL: Alert and oriented, NAD.   HEENT: EOMs intact. No cervical adenopathy.   LUNGS: b/l Clear to ascultation, without creps or rhonci.  CHEST: Nontender to palpation.  CVS: S1 and S2. Regular.  ABDOMEN: Soft, nontender, nondistended. Positive bowel sounds. No hepatomegaly.  MUSCULOSKELETAL: No swelling, clubbing, or edema.    NEURO: grossly nonfocal, cranial nerves intact.  SKIN: No rash or major bruising.   LABORATORY PANEL:   CBC  Last Labs      Recent Labs Lab 05/08/15 1847  WBC 10.6  HGB 11.2*  HCT 33.3*  PLT 351     ------------------------------------------------------------------------------------------------------------------  Chemistries   Last Labs      Recent Labs Lab 05/08/15 1847  NA 133*  K 4.8  CL 95*  CO2 29  GLUCOSE 112*  BUN 35*  CREATININE 1.62*  CALCIUM 8.6*     ------------------------------------------------------------------------------------------------------------------  Cardiac Enzymes  Last Labs      Recent Labs Lab 05/08/15 1847  TROPONINI 0.06*     ------------------------------------------------------------------------------------------------------------------  RADIOLOGY:   Imaging Results (Last 48 hours)    Dg Chest 2 View  05/08/2015 CLINICAL DATA: Cough for 2  months. Mid chest and mid back pain for 3 weeks. Breast cancer with history of metastatic disease to lung and liver. EXAM: CHEST 2 VIEW COMPARISON: Radiographs and chest CT 03/15/2015 FINDINGS: Single lead left-sided pacemaker remains in place. Cardiomediastinal contours are unchanged, heart at the upper limits of normal in size. Mild right hilar prominence again seen. The small pulmonary nodules on recent CT are not well seen radiographically. Scarring in the right midlung zone is again seen. No pulmonary edema, confluent airspace disease, large pleural effusion or pneumothorax. The bones are under mineralized with mild degenerative change in the spine. IMPRESSION: Unchanged radiographic appearance of the chest. Electronically Signed By: Jeb Levering M.D.  On: 05/08/2015 19:38   Dg Thoracic Spine 2 View  05/08/2015 CLINICAL DATA: Several days of mid and lumbago/low back pain. EXAM: THORACIC SPINE 2 VIEWS COMPARISON: 03/15/2015. FINDINGS: Thoracic spine DISH. No compression fractures are identified. Swimmer's view is submitted for interpretation. The superior aspect of the T1 vertebra is poorly visible because of bony overlap. No gross fracture or malalignment. Paraspinal lines appear within normal limits. LEFT subclavian AICD noted. IMPRESSION: No acute abnormality or interval change. Electronically Signed By: Arvilla Market.D.  On: 05/08/2015 21:11   Dg Lumbar Spine 2-3 Views  05/08/2015 CLINICAL DATA: Back pain. Breast cancer EXAM: LUMBAR SPINE - 2-3 VIEW COMPARISON: None. FINDINGS: Negative for fracture or mass. No lytic or sclerotic mass lesion. Disc and facet degeneration L4-5 with grade 1 anterior slip of L4 on L5. Remaining disc spaces are normal. IMPRESSION: Disc degeneration at L4-5. No fracture or metastatic disease identified. Electronically Signed By: Franchot Gallo M.D. On: 05/08/2015 21:09    IMPRESSION / RECOMMENDATIONS: 81 year old Caucasian  female history of stage IV breast cancer with metastases to liver and lung admited with poorly controlled pain. She is on Ibrance + Faslodex for palliative treatment, recently started taking Ibrance. Agree with ongoing supportive management, continue Fentanyl patch and titrate as indicated for pain control. Will change Oxycodone to 5-10 mg q 3 hours prn for breakthrough pain.Having persistent cough, add Tussionex prn alongwith Tessalon perles TID. If symptoms persist, consider CT scan of chest. Hyponatremia is better. Also discussed code status, patient does not want mechanical ventilation or resuscitation in case of cardiopulmonary failure/arrest, will order DNR status accordingly. Will continue to follow.

## 2015-05-10 NOTE — Progress Notes (Signed)
Speech Therapy Note:   Reviewed chart notes, consulted NSG, consulted with Pt and family.  Pt is NPO awaiting GI consult.  ST will follow-up after GI assessment / recommendations with toleration of diet/POs. Pt family agreed.

## 2015-05-10 NOTE — Care Management (Signed)
Admitted to [facility] with diagnosis of intractable pain. Lives with husband BL [phone]). States she has had home health in the past, doesn't remember name of agency. No skilled facility. No home oxygen. Uses a rolling walker to aid in ambulation. Seen Dr. Netty Starring since last admission of 03/21/15. Takes care of all activities of daily living herself, but husband helps. Husband takes care of all instrumental activities of daily living. No Life Alert. No falls "lately," Usually has a good appetite.  Physical therapy evaluation completed yesterday. Recommends home with home health/physical therapy, Declining services at this time.  Husband will transport. Shelbie Ammons RN MSN Care Management [phone]

## 2015-05-10 NOTE — Discharge Summary (Signed)
[facility] NAME: Fama Muenchow    MR#:  [id number]  DATE OF BIRTH:  1932/12/28  DATE OF ADMISSION:  05/08/2015 ADMITTING PHYSICIAN: Lytle Butte, MD  DATE OF DISCHARGE: No discharge date for patient encounter.  PRIMARY CARE PHYSICIAN: Dion Body, MD    ADMISSION DIAGNOSIS:  NSTEMI (non-ST elevated myocardial infarction) [I21.4] Chest pain, unspecified chest pain type [R07.9] Midline back pain, unspecified location [M54.89]   DISCHARGE DIAGNOSIS:  Intractable pain, history ofbreast cancer with metastasis Hypoglycemia Acute renal failure Esophagitis, gastritis, duodenitis SECONDARY DIAGNOSIS:   Past Medical History  Diagnosis Date  . Diabetes mellitus without complication   . Hypertension   . Coronary artery disease   . Cancer   . CHF (congestive heart failure)   . AICD (automatic cardioverter/defibrillator) present   . Shortness of breath dyspnea   . Breast cancer right mastectomy    HOSPITAL COURSE:   1. Intractable pain, history ofbreast cancer with metastasis:  The patient, the pain control medication. She has no complaint of the body pain since yesterday.  2. Chest pain, central: Aspirin/statin therapy.  3. Acute kidney injury: The patient got IV fluid hydration normal saline, Lasix is on hold. Renal function is better. She needs  follow-up BMP as outpatient.  4. Hyponatremia.improved with IV fluid hydration normal saline.   * severe hypoglycemia, the patient's blood sugar decreased to 14 reported yesterday morning improved to 73 after treatment.hypoglycemia is possibly due to poor oral intake and insulin. I discontinued Levemir and NovoLog and she is on sliding scale. Blood sugar is normal.  *substernal chest pain while eating, due to esophagitis, gastritis and duodenitis.  Per speech study staff, she has no dysphagia but complains of substernal pain while eating, she only eats one quarter  of the food. I  requested GI consult to rule out esophagitis or stricture. Dr. Allen Norris did the EGD today, which show  esophagitis, gastritis and duodenitis. He suggests Protonix twice a day. 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  DISCHARGE CONDITIONS:   Stable, discharged home today.  CONSULTS OBTAINED:  Treatment Team:  Lytle Butte, MD Leia Alf, MD Lucilla Lame, MD  DRUG ALLERGIES:   Allergies  Allergen Reactions  . Metformin Nausea And Vomiting  . Codeine Rash  . Eliquis [Apixaban] Rash and Other (See Comments)    Reaction:  Bleeding     DISCHARGE MEDICATIONS:   Current Discharge Medication List    START taking these medications   Details  pantoprazole (PROTONIX) 40 MG tablet Take 1 tablet (40 mg total) by mouth daily. Qty: 60 tablet, Refills: 0      CONTINUE these medications which have NOT CHANGED   Details  albuterol (PROVENTIL HFA;VENTOLIN HFA) 108 (90 BASE) MCG/ACT inhaler Inhale 2 puffs into the lungs every 6 (six) hours as needed for wheezing or shortness of breath. Qty: 1 Inhaler, Refills: 0    atorvastatin (LIPITOR) 40 MG tablet Take 40 mg by mouth daily.    furosemide (LASIX) 20 MG tablet Take 20 mg by mouth daily.    metoprolol succinate (TOPROL-XL) 25 MG 24 hr tablet Take 12.5 mg by mouth daily.    Omega-3 Fatty Acids (FISH OIL) 1000 MG CAPS Take 1 capsule by mouth daily.    palbociclib (IBRANCE) 125 MG capsule Take 125 mg by mouth daily with breakfast. Take daily for three weeks and stop for one week. Take whole with food.    vitamin  B-12 (CYANOCOBALAMIN) 1000 MCG tablet Take 1,000 mcg by mouth daily.    Vitamin D, Ergocalciferol, (DRISDOL) 50000 UNITS CAPS capsule Take 50,000 Units by mouth every 7 (seven) days. Pt takes on Monday.    benzonatate (TESSALON PERLES) 100 MG capsule Take 1 capsule (100 mg total) by mouth every 6 (six) hours as needed for cough. Qty: 30 capsule, Refills: 0    guaiFENesin-dextromethorphan  (ROBITUSSIN DM) 100-10 MG/5ML syrup Take 5 mLs by mouth every 4 (four) hours as needed for cough. Qty: 118 mL, Refills: 0    living well with diabetes book MISC 1 each by Does not apply route once. Qty: 1 each, Refills: 0    pseudoephedrine-codeine-guaifenesin (CHERATUSSIN DAC) 30-10-100 MG/5ML solution Take 5 - 10 mL orally 4 times a day as needed for cough. Qty: 240 mL, Refills: 0   Associated Diagnoses: Breast CA, unspecified laterality    traMADol (ULTRAM) 50 MG tablet Take 1 tablet (50 mg total) by mouth every 8 (eight) hours as needed. Qty: 12 tablet, Refills: 0      STOP taking these medications     Insulin Lispro Prot & Lispro (HUMALOG 75/25 MIX) (75-25) 100 UNIT/ML Kwikpen      ciprofloxacin (CIPRO) 500 MG tablet          DISCHARGE INSTRUCTIONS:    If you experience worsening of your admission symptoms, develop shortness of breath, life threatening emergency, suicidal or homicidal thoughts you must seek medical attention immediately by calling 911 or calling your MD immediately  if symptoms less severe.  You Must read complete instructions/literature along with all the possible adverse reactions/side effects for all the Medicines you take and that have been prescribed to you. Take any new Medicines after you have completely understood and accept all the possible adverse reactions/side effects.   Please note  You were cared for by a hospitalist during your hospital stay. If you have any questions about your discharge medications or the care you received while you were in the hospital after you are discharged, you can call the unit and asked to speak with the hospitalist on call if the hospitalist that took care of you is not available. Once you are discharged, your primary care physician will handle any further medical issues. Please note that NO REFILLS for any discharge medications will be authorized once you are discharged, as it is imperative that you return to your  primary care physician (or establish a relationship with a primary care physician if you do not have one) for your aftercare needs so that they can reassess your need for medications and monitor your lab values.    Today   SUBJECTIVE   Substernal pain while eating this morning.   VITAL SIGNS:  Blood pressure 94/50, pulse 78, temperature 97.2 F (36.2 C), temperature source Tympanic, resp. rate 21, height 5\' 4"  (1.626 m), weight 71.442 kg (157 lb 8 oz), SpO2 100 %.  I/O:   Intake/Output Summary (Last 24 hours) at 05/10/15 1633 Last data filed at 05/10/15 1627  Gross per 24 hour  Intake   1354 ml  Output    950 ml  Net    404 ml    PHYSICAL EXAMINATION:  GENERAL:  81 y.o.-year-old patient lying in the bed with no acute distress.  EYES: Pupils equal, round, reactive to light and accommodation. No scleral icterus. Extraocular muscles intact.  HEENT: Head atraumatic, normocephalic. Oropharynx and nasopharynx clear. Moist oral mucosa. NECK:  Supple, no jugular venous distention. No thyroid  enlargement, no tenderness.  LUNGS: Normal breath sounds bilaterally, no wheezing, rales,rhonchi or crepitation. No use of accessory muscles of respiration.  CARDIOVASCULAR: S1, S2 normal. No murmurs, rubs, or gallops.  ABDOMEN: Soft, non-tender, non-distended. Bowel sounds present. No organomegaly or mass.  EXTREMITIES: No pedal edema, cyanosis, or clubbing.  NEUROLOGIC: Cranial nerves II through XII are intact. Muscle strength 5/5 in all extremities. Sensation intact. Gait not checked.  PSYCHIATRIC: The patient is alert and oriented x 3.  SKIN: No obvious rash, lesion, or ulcer.   DATA REVIEW:   CBC  Recent Labs Lab 05/09/15 0736  WBC 11.1*  HGB 10.7*  HCT 31.8*  PLT 330    Chemistries   Recent Labs Lab 05/10/15 0427  NA 134*  K 4.9  CL 100*  CO2 26  GLUCOSE 88  BUN 32*  CREATININE 1.52*  CALCIUM 7.7*    Cardiac Enzymes  Recent Labs Lab 05/09/15 1315  TROPONINI  0.06*    Microbiology Results  Results for orders placed or performed during the hospital encounter of 03/15/15  Blood culture (routine x 2)     Status: None   Collection Time: 03/15/15  4:20 PM  Result Value Ref Range Status   Specimen Description BLOOD LEFT ARM  Final   Special Requests   Final    BOTTLES DRAWN AEROBIC AND ANAEROBIC 2CC AEROBIC,2CC ANAEROBIC   Culture NO GROWTH 5 DAYS  Final   Report Status 03/20/2015 FINAL  Final  Blood culture (routine x 2)     Status: None   Collection Time: 03/15/15  4:20 PM  Result Value Ref Range Status   Specimen Description BLOOD LEFT HAND  Final   Special Requests   Final    BOTTLES DRAWN AEROBIC AND ANAEROBIC 2CC AEROBIC,2CC ANAEROBIC   Culture NO GROWTH 5 DAYS  Final   Report Status 03/20/2015 FINAL  Final  C difficile quick scan w PCR reflex     Status: None   Collection Time: 03/17/15  7:41 AM  Result Value Ref Range Status   C Diff antigen NEGATIVE NEGATIVE Final   C Diff toxin NEGATIVE NEGATIVE Final   C Diff interpretation Negative for C. difficile  Final    RADIOLOGY:  Dg Chest 2 View  05/08/2015   CLINICAL DATA:  Cough for 2 months. Mid chest and mid back pain for 3 weeks. Breast cancer with history of metastatic disease to lung and liver.  EXAM: CHEST  2 VIEW  COMPARISON:  Radiographs and chest CT 03/15/2015  FINDINGS: Single lead left-sided pacemaker remains in place. Cardiomediastinal contours are unchanged, heart at the upper limits of normal in size. Mild right hilar prominence again seen. The small pulmonary nodules on recent CT are not well seen radiographically. Scarring in the right midlung zone is again seen. No pulmonary edema, confluent airspace disease, large pleural effusion or pneumothorax. The bones are under mineralized with mild degenerative change in the spine.  IMPRESSION: Unchanged radiographic appearance of the chest.   Electronically Signed   By: Jeb Levering M.D.   On: 05/08/2015 19:38   Dg Thoracic  Spine 2 View  05/08/2015   CLINICAL DATA:  Several days of mid and lumbago/low back pain.  EXAM: THORACIC SPINE 2 VIEWS  COMPARISON:  03/15/2015.  FINDINGS: Thoracic spine DISH. No compression fractures are identified. Swimmer's view is submitted for interpretation. The superior aspect of the T1 vertebra is poorly visible because of bony overlap. No gross fracture or malalignment. Paraspinal lines appear within normal  limits. LEFT subclavian AICD noted.  IMPRESSION: No acute abnormality or interval change.   Electronically Signed   By: Dereck Ligas M.D.   On: 05/08/2015 21:11   Dg Lumbar Spine 2-3 Views  05/08/2015   CLINICAL DATA:  Back pain.  Breast cancer  EXAM: LUMBAR SPINE - 2-3 VIEW  COMPARISON:  None.  FINDINGS: Negative for fracture or mass.  No lytic or sclerotic mass lesion.  Disc and facet degeneration L4-5 with grade 1 anterior slip of L4 on L5. Remaining disc spaces are normal.  IMPRESSION: Disc degeneration at L4-5. No fracture or metastatic disease identified.   Electronically Signed   By: Franchot Gallo M.D.   On: 05/08/2015 21:09        Management plans discussed with the patient, her son and they are in agreement.  CODE STATUS:     Code Status Orders        Start     Ordered   05/09/15 8638  Do not attempt resuscitation (DNR)   Continuous    Question Answer Comment  In the event of cardiac or respiratory ARREST Do not call a "code blue"   In the event of cardiac or respiratory ARREST Do not perform Intubation, CPR, defibrillation or ACLS   In the event of cardiac or respiratory ARREST Use medication by any route, position, wound care, and other measures to relive pain and suffering. May use oxygen, suction and manual treatment of airway obstruction as needed for comfort.      05/09/15 1514    Advance Directive Documentation        Most Recent Value   Type of Advance Directive  Healthcare Power of Attorney, Living will   Pre-existing out of facility DNR order  (yellow form or pink MOST form)     "MOST" Form in Place?        TOTAL TIME TAKING CARE OF THIS PATIENT: 42 minutes.    Demetrios Loll M.D on 05/10/2015 at 4:33 PM  Between 7am to 6pm - Pager - [phone]  After 6pm go to [email or web address] - password [email or web address] [facility]  Office  [phone]  CC: Primary care physician; Dion Body, MD

## 2015-05-10 NOTE — Consult Note (Signed)
[facility]  [address] [place], [place] [address] Phone: [phone] Fax : [phone]  Consultation  Referring Provider:     No ref. provider found Primary Care Physician:  Lauren Body, MD Primary Gastroenterologist:  None          Reason for Consultation:     Dysphagia  Date of Admission:  05/08/2015 Date of Consultation:  05/10/2015         HPI:   Lauren Best is a 81 y.o. female who comes in with a history of metastatic lung cancer who came in because of pain in her chest. The patient states it has been chronic for the last couple of months. She states that she also has dysphagia and reported to be worse with solids than liquids. There is no report of any fevers or chills but she does report that she has lost approximately 10 pounds of weight. The patient had a history of breast cancer in 2009 and then went to [facility] and was found to have a lesion on her liver consistent with metastases. She reports that the pain in her chest has been so disabling that she couldn't take the pain anymore so she came to the hospital. The patient had breakfast this morning which consisted of eggs hash browns and toast. It is no report of any black stools or bloody stools. She also denies any vomiting up of blood.  Past Medical History  Diagnosis Date  . Diabetes mellitus without complication   . Hypertension   . Coronary artery disease   . Cancer   . CHF (congestive heart failure)   . AICD (automatic cardioverter/defibrillator) present   . Shortness of breath dyspnea   . Breast cancer right mastectomy    Past Surgical History  Procedure Laterality Date  . Cardiac defibrillator placement Left   . Mastectomy Right   . Joint replacement Right     knee    Prior to Admission medications   Medication Sig Start Date End Date Taking? Authorizing Provider  albuterol (PROVENTIL HFA;VENTOLIN HFA) 108 (90 BASE) MCG/ACT inhaler Inhale 2 puffs into the  lungs every 6 (six) hours as needed for wheezing or shortness of breath. 03/04/15  Yes Lauren Pear, MD  atorvastatin (LIPITOR) 40 MG tablet Take 40 mg by mouth daily.   Yes Historical Provider, MD  furosemide (LASIX) 20 MG tablet Take 20 mg by mouth daily.   Yes Historical Provider, MD  Insulin Lispro Prot & Lispro (HUMALOG 75/25 MIX) (75-25) 100 UNIT/ML Kwikpen Inject 42 Units into the skin 2 (two) times daily.  03/28/15  Yes Historical Provider, MD  metoprolol succinate (TOPROL-XL) 25 MG 24 hr tablet Take 12.5 mg by mouth daily.   Yes Historical Provider, MD  Omega-3 Fatty Acids (FISH OIL) 1000 MG CAPS Take 1 capsule by mouth daily.   Yes Historical Provider, MD  palbociclib (IBRANCE) 125 MG capsule Take 125 mg by mouth daily with breakfast. Take daily for three weeks and stop for one week. Take whole with food. 05/04/15  Yes Historical Provider, MD  vitamin B-12 (CYANOCOBALAMIN) 1000 MCG tablet Take 1,000 mcg by mouth daily.   Yes Historical Provider, MD  Vitamin D, Ergocalciferol, (DRISDOL) 50000 UNITS CAPS capsule Take 50,000 Units by mouth every 7 (seven) days. Pt takes on Monday.   Yes Historical Provider, MD  benzonatate (TESSALON PERLES) 100 MG capsule Take 1 capsule (100 mg total) by mouth every 6 (six) hours as needed for cough. Patient not  taking: Reported on 05/08/2015 03/04/15 03/03/16  Lauren Pear, MD  ciprofloxacin (CIPRO) 500 MG tablet Take 1 tablet (500 mg total) by mouth 2 (two) times daily. Patient not taking: Reported on 05/08/2015 04/21/15   Lauren Alf, MD  guaiFENesin-dextromethorphan [facility] DM) 100-10 MG/5ML syrup Take 5 mLs by mouth every 4 (four) hours as needed for cough. Patient not taking: Reported on 05/08/2015 03/20/15   Lauren Jewett, MD  living well with diabetes book MISC 1 each by Does not apply route once. Patient not taking: Reported on 05/08/2015 03/21/15   Lauren Jewett, MD  pseudoephedrine-codeine-guaifenesin (CHERATUSSIN DAC) 30-10-100 MG/5ML  solution Take 5 - 10 mL orally 4 times a day as needed for cough. Patient not taking: Reported on 05/08/2015 04/18/15   Lauren Alf, MD  traMADol (ULTRAM) 50 MG tablet Take 1 tablet (50 mg total) by mouth every 8 (eight) hours as needed. Patient not taking: Reported on 05/08/2015 01/09/15   Lauren Dike, FNP    Family History  Problem Relation Age of Onset  . Alzheimer's disease Mother   . Congestive Heart Failure Father      Social History  Substance Use Topics  . Smoking status: Never Smoker   . Smokeless tobacco: Never Used  . Alcohol Use: No    Allergies as of 05/08/2015 - Review Complete 05/08/2015  Allergen Reaction Noted  . Metformin Nausea And Vomiting 03/30/2015  . Codeine Rash 03/04/2015  . Eliquis [apixaban] Rash and Other (See Comments) 01/31/2015    Review of Systems:    All systems reviewed and negative except where noted in HPI.   Physical Exam:  Vital signs in last 24 hours: Temp:  [98.3 F (36.8 C)-99.4 F (37.4 C)] 98.3 F (36.8 C) (09/28 0508) Pulse Rate:  [87-96] 87 (09/28 0508) Resp:  [20] 20 (09/28 0508) BP: (96-126)/(40-50) 122/50 mmHg (09/28 0508) SpO2:  [88 %-97 %] 97 % (09/28 0508) Last BM Date: 05/10/15 General:   Pleasant, cooperative in NAD Head:  Normocephalic and atraumatic. Eyes:   No icterus.   Conjunctiva pink. PERRLA. Ears:  Normal auditory acuity. Neck:  Supple; no masses or thyroidomegaly Lungs: Respirations even and unlabored. Lungs clear to auscultation bilaterally.   No wheezes, crackles, or rhonchi.  Heart:  Regular rate and rhythm;  Without murmur, clicks, rubs or gallops Abdomen:  Soft, nondistended, nontender. Normal bowel sounds. No appreciable masses or hepatomegaly.  No rebound or guarding.  Rectal:  Not performed. Msk:  Symmetrical without gross deformities.  Strength  Extremities:  Without edema, cyanosis or clubbing. Neurologic:  Alert and oriented x3;  grossly normal neurologically. Skin:  Intact without  significant lesions or rashes. Cervical Nodes:  No significant cervical adenopathy. Psych:  Alert and cooperative. Normal affect.  LAB RESULTS:  Recent Labs  05/08/15 1847 05/09/15 0736  WBC 10.6 11.1*  HGB 11.2* 10.7*  HCT 33.3* 31.8*  PLT 351 330   BMET  Recent Labs  05/08/15 1847 05/09/15 0736 05/10/15 0427  NA 133* 137 134*  K 4.8 4.5 4.9  CL 95* 105 100*  CO2 29 25 26   GLUCOSE 112* 31* 88  BUN 35* 36* 32*  CREATININE 1.62* 1.46* 1.52*  CALCIUM 8.6* 8.1* 7.7*   LFT No results for input(s): PROT, ALBUMIN, AST, ALT, ALKPHOS, BILITOT, BILIDIR, IBILI in the last 72 hours. PT/INR No results for input(s): LABPROT, INR in the last 72 hours.  STUDIES: Dg Chest 2 View  05/08/2015   CLINICAL DATA:  Cough for 2 months.  Mid chest and mid back pain for 3 weeks. Breast cancer with history of metastatic disease to lung and liver.  EXAM: CHEST  2 VIEW  COMPARISON:  Radiographs and chest CT 03/15/2015  FINDINGS: Single lead left-sided pacemaker remains in place. Cardiomediastinal contours are unchanged, heart at the upper limits of normal in size. Mild right hilar prominence again seen. The small pulmonary nodules on recent CT are not well seen radiographically. Scarring in the right midlung zone is again seen. No pulmonary edema, confluent airspace disease, large pleural effusion or pneumothorax. The bones are under mineralized with mild degenerative change in the spine.  IMPRESSION: Unchanged radiographic appearance of the chest.   Electronically Signed   By: Jeb Levering M.D.   On: 05/08/2015 19:38   Dg Thoracic Spine 2 View  05/08/2015   CLINICAL DATA:  Several days of mid and lumbago/low back pain.  EXAM: THORACIC SPINE 2 VIEWS  COMPARISON:  03/15/2015.  FINDINGS: Thoracic spine DISH. No compression fractures are identified. Swimmer's view is submitted for interpretation. The superior aspect of the T1 vertebra is poorly visible because of bony overlap. No gross fracture or  malalignment. Paraspinal lines appear within normal limits. LEFT subclavian AICD noted.  IMPRESSION: No acute abnormality or interval change.   Electronically Signed   By: Dereck Ligas M.D.   On: 05/08/2015 21:11   Dg Lumbar Spine 2-3 Views  05/08/2015   CLINICAL DATA:  Back pain.  Breast cancer  EXAM: LUMBAR SPINE - 2-3 VIEW  COMPARISON:  None.  FINDINGS: Negative for fracture or mass.  No lytic or sclerotic mass lesion.  Disc and facet degeneration L4-5 with grade 1 anterior slip of L4 on L5. Remaining disc spaces are normal.  IMPRESSION: Disc degeneration at L4-5. No fracture or metastatic disease identified.   Electronically Signed   By: Franchot Gallo M.D.   On: 05/08/2015 21:09      Impression / Plan:   Lauren Best is a 81 y.o. y/o female with who has a history of metastatic breast cancer who comes in now with a few months of dysphagia. The patient ate breakfast this morning therefore cannot have a upper endoscopy for a total of 8 hours after eating. I spoke with anesthesia we will plan for an upper endoscopy for later today. The patient has been explained the plan and agrees with it.I have discussed risks & benefits which include, but are not limited to, bleeding, infection, perforation & drug reaction.  The patient agrees with this plan & written consent will be obtained.      Thank you for involving me in the care of this patient.        Ollen Bowl, MD  05/10/2015, 12:02 PM   Note: This dictation was prepared with Dragon dictation along with smaller phrase technology. Any transcriptional errors that result from this process are unintentional.

## 2015-05-10 NOTE — Op Note (Signed)
[facility] Gastroenterology Patient Name: Lauren Best Procedure Date: 05/10/2015 4:08 PM MRN: [id number] Account #: [id number] Date of Birth: Nov 03, 1932 Admit Type: Inpatient Age: 81 Room: [facility] ENDO ROOM 4 Gender: Female Note Status: Finalized Procedure:         Upper GI endoscopy Indications:       Dysphagia Providers:         Lucilla Lame, MD Referring MD:      Rhett Bannister. Ma Hillock, MD (Referring MD) Medicines:         Propofol per Anesthesia Complications:     No immediate complications. Procedure:         Pre-Anesthesia Assessment:                    - Prior to the procedure, a History and Physical was                     performed, and patient medications and allergies were                     reviewed. The patient's tolerance of previous anesthesia                     was also reviewed. The risks and benefits of the procedure                     and the sedation options and risks were discussed with the                     patient. All questions were answered, and informed consent                     was obtained. Prior Anticoagulants: The patient has taken                     no previous anticoagulant or antiplatelet agents. ASA                     Grade Assessment: IV - A patient with severe systemic                     disease that is a constant threat to life. After reviewing                     the risks and benefits, the patient was deemed in                     satisfactory condition to undergo the procedure.                    After obtaining informed consent, the endoscope was passed                     under direct vision. Throughout the procedure, the                     patient's blood pressure, pulse, and oxygen saturations                     were monitored continuously. The Endoscope was introduced                     through the mouth, and advanced to the second part of  duodenum. The upper GI endoscopy was accomplished  without                     difficulty. The patient tolerated the procedure well. Findings:      LA Grade D (one or more mucosal breaks involving at least 75% of       esophageal circumference) esophagitis with no bleeding was found in the       entire esophagus. Cells for cytology were obtained by brushing.      Localized minimal inflammation characterized by erythema was found in       the gastric antrum.      Localized mild inflammation characterized by erosions was found in the       second part of the duodenum. Impression:        - LA Grade D esophagitis. Cells for cytology obtained.                    - Gastritis.                    - Duodenitis. Recommendation:    - Use a proton pump inhibitor PO BID. Procedure Code(s): --- Professional ---                    [phone], Esophagogastroduodenoscopy, flexible, transoral;                     diagnostic, including collection of specimen(s) by                     brushing or washing, when performed (separate procedure) Diagnosis Code(s): --- Professional ---                    R13.10, Dysphagia, unspecified                    K20.9, Esophagitis, unspecified                    K29.70, Gastritis, unspecified, without bleeding                    K29.80, Duodenitis without bleeding CPT copyright 2014 American Medical Association. All rights reserved. The codes documented in this report are preliminary and upon coder review may  be revised to meet current compliance requirements. Lucilla Lame, MD 05/10/2015 4:18:30 PM This report has been signed electronically. Number of Addenda: 0 Note Initiated On: 05/10/2015 4:08 PM      [facility]

## 2015-05-11 ENCOUNTER — Encounter: Payer: Self-pay | Admitting: Gastroenterology

## 2015-05-11 DIAGNOSIS — B3781 Candidal esophagitis: Secondary | ICD-10-CM | POA: Diagnosis not present

## 2015-05-11 LAB — BASIC METABOLIC PANEL
Anion gap: 7 (ref 5–15)
BUN: 31 mg/dL — ABNORMAL HIGH (ref 6–20)
CALCIUM: 7.8 mg/dL — AB (ref 8.9–10.3)
CO2: 25 mmol/L (ref 22–32)
CREATININE: 1.45 mg/dL — AB (ref 0.44–1.00)
Chloride: 105 mmol/L (ref 101–111)
GFR, EST AFRICAN AMERICAN: 38 mL/min — AB (ref >60)
GFR, EST NON AFRICAN AMERICAN: 33 mL/min — AB (ref >60)
Glucose, Bld: 177 mg/dL — ABNORMAL HIGH (ref 65–99)
Potassium: 4.8 mmol/L (ref 3.5–5.1)
SODIUM: 137 mmol/L (ref 135–145)

## 2015-05-11 LAB — GLUCOSE, CAPILLARY
GLUCOSE-CAPILLARY: 169 mg/dL — AB (ref 65–99)
GLUCOSE-CAPILLARY: 218 mg/dL — AB (ref 65–99)

## 2015-05-11 MED ORDER — FLUCONAZOLE 100 MG PO TABS: 100.0000 mg | ORAL_TABLET | Freq: Every day | ORAL | Status: DC

## 2015-05-11 MED ORDER — FLUCONAZOLE 100 MG PO TABS
100.0000 mg | ORAL_TABLET | Freq: Every day | ORAL | Status: DC
  Administered 2015-05-11: 100 mg via ORAL
  Filled 2015-05-11: qty 1

## 2015-05-11 MED ORDER — SUCRALFATE 1 GM/10ML PO SUSP: 1.0000 g | Freq: Three times a day (TID) | ORAL | Status: DC

## 2015-05-11 MED ORDER — INSULIN LISPRO PROT & LISPRO (75-25 MIX) 100 UNIT/ML KWIKPEN
10.0000 [IU] | PEN_INJECTOR | Freq: Two times a day (BID) | SUBCUTANEOUS | Status: AC
Start: 2015-05-11 — End: ?

## 2015-05-11 NOTE — Progress Notes (Signed)
Speech Language Pathology Treatment: Dysphagia  Patient Details Name: Lauren Best MRN: [id number] DOB: 10/19/1932 Today's Date: 05/11/2015 Time: 1100-1200 SLP Time Calculation (min) (ACUTE ONLY): 60 min  Assessment / Plan / Recommendation Clinical Impression  Pt appears to be tolerating bites and sips of thin liquids, and soft solids. Pt continues to c/o chest discomfort but is trying to better monitor her intake w/ solids and moisten them well, cut them small. Thorough discussion re: aspiration and reflux precautions as well as multiple handouts on diet consistency, diet options, and food preparation were given to husband and pt. Pt stated swallowing pills w/ applesauce "seemed to help". Rec. Continue f/u w/ GI and Dietician for nutritional supplement as the Esophagitis and Gastritis continue to heal. Pt agreed. ST will f/u w/ further education as nec. NSG updated.    HPI Other Pertinent Information: Pegah Segel is a 81 y.o. female with a known history of stage IV breast cancer with known metastases to lung and liver presenting with pain. She describes chest pain which has been chronic for approximately 2 months total duration with associated cough nonproductive denies fevers or chills. Occasional shortness of breath although this is unchanged 2 months total duration. She states she simply "can't take it anymore". In the emergency department despite multiple doses and medications states her pain is essentially the same. Pt had an EGD performed yesterday which indicated Esophagitis and Gastritis. Pt has been placed on PPIs and will f/u w/ GI. Husband present.    Pertinent Vitals Pain Assessment: No/denies pain  SLP Plan  Continue with current plan of care    Recommendations Diet recommendations: Dysphagia 2 (fine chop);Dysphagia 3 (mechanical soft);Thin liquid Liquids provided via: Cup Medication Administration: Whole meds with liquid (or w/ puree if easier tolerated  Esophageally) Supervision: Patient able to self feed Compensations: Slow rate;Small sips/bites;Follow solids with liquid;Multiple dry swallows after each bite/sip (time b/t trials) Postural Changes and/or Swallow Maneuvers: Seated upright 90 degrees (reflux precautions)              General recommendations:  (f/u w/ GI) Oral Care Recommendations: Oral care BID;Patient independent with oral care Follow up Recommendations:  (f/u w/ GI) Plan: Continue with current plan of care    Baring, [place], CCC-SLP  Watson,Katherine 05/11/2015, 5:29 PM

## 2015-05-11 NOTE — Progress Notes (Signed)
Pt and husband given d/c instructions r/t medications, diet, followup care, insulin and new medications, and when to call MD.  Prescriptions x 2 given to pt and husband, pt d/c home via wheelchair escorted by staff and family.

## 2015-05-11 NOTE — Plan of Care (Signed)
Problem: Discharge Progression Outcomes Goal: Other Discharge Outcomes/Goals Outcome: Progressing Plan of care progress to goal: VSS Pt complains of no pain this shift Iv fluids dcd Tolerating diet

## 2015-05-11 NOTE — Discharge Summary (Signed)
[facility] [place] NAME: Lauren Best    MR#:  [id number]  DATE OF BIRTH:  05-05-1933  DATE OF ADMISSION:  05/08/2015 ADMITTING PHYSICIAN: Lytle Butte, MD  DATE OF DISCHARGE: 05/11/2015 PRIMARY CARE PHYSICIAN: Dion Body, MD    ADMISSION DIAGNOSIS:  NSTEMI (non-ST elevated myocardial infarction) [I21.4] Chest pain, unspecified chest pain type [R07.9] Midline back pain, unspecified location [M54.89]   DISCHARGE DIAGNOSIS:  Intractable pain, history ofbreast cancer with metastasis Hypoglycemia Acute renal failure Candida esophagitis, gastritis, duodenitis SECONDARY DIAGNOSIS:   Past Medical History  Diagnosis Date  . Diabetes mellitus without complication   . Hypertension   . Coronary artery disease   . Cancer   . CHF (congestive heart failure)   . AICD (automatic cardioverter/defibrillator) present   . Shortness of breath dyspnea   . Breast cancer right mastectomy    HOSPITAL COURSE:   1. Intractable pain, history ofbreast cancer with metastasis:  The patient, the pain control medication. She has no complaint of the body pain since yesterday.  2. Chest pain, central: Aspirin/statin therapy.  3. Acute kidney injury: The patient got IV fluid hydration normal saline, Lasix is on hold. Renal function is better. She needs  follow-up BMP as outpatient.  4. Hyponatremia.improved with IV fluid hydration normal saline.   * severe hypoglycemia, the patient's blood sugar decreased to 14 and improved to 73 after treatment. Hypoglycemia is possibly due to poor oral intake and insulin. I discontinued Levemir and NovoLog and she is on sliding scale. Blood sugar was normal. But today's blood sugar is about 200. The patient's need continue NovoLog 75/25 but a decrease the dose from 42 units twice a day to 10 units twice a day. She needs closely monitor her blood sugar and a follow-up PCP to adjust the dose.  *substernal  chest pain while eating, due to Candida esophagitis, gastritis and duodenitis.  Per speech study staff, she has no dysphagia but complains of substernal pain while eating, she only eats one quarter of the food. I  requested GI consult to rule out esophagitis or stricture. Dr. Allen Norris did the EGD yesterday, which showed  esophagitis, gastritis and duodenitis. He suggests Protonix twice a day. Biopsy pathology showed Candida. Dr.Wohl suggests Diflucan 100 mg by mouth daily. 3 weeks. PPI twice a day.  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  DISCHARGE CONDITIONS:   Stable, discharged home today.  CONSULTS OBTAINED:  Treatment Team:  Lytle Butte, MD Leia Alf, MD Lucilla Lame, MD  DRUG ALLERGIES:   Allergies  Allergen Reactions  . Metformin Nausea And Vomiting  . Codeine Rash  . Eliquis [Apixaban] Rash and Other (See Comments)    Reaction:  Bleeding     DISCHARGE MEDICATIONS:   Current Discharge Medication List    START taking these medications   Details  fluconazole (DIFLUCAN) 100 MG tablet Take 1 tablet (100 mg total) by mouth daily. Qty: 20 tablet, Refills: 0    pantoprazole (PROTONIX) 40 MG tablet Take 1 tablet (40 mg total) by mouth daily. Qty: 60 tablet, Refills: 0      CONTINUE these medications which have CHANGED   Details  Insulin Lispro Prot & Lispro (HUMALOG 75/25 MIX) (75-25) 100 UNIT/ML Kwikpen Inject 10 Units into the skin 2 (two) times daily. Qty: 15 mL, Refills: 0   Associated Diagnoses: Breast CA, unspecified laterality      CONTINUE these medications which have NOT CHANGED  Details  albuterol (PROVENTIL HFA;VENTOLIN HFA) 108 (90 BASE) MCG/ACT inhaler Inhale 2 puffs into the lungs every 6 (six) hours as needed for wheezing or shortness of breath. Qty: 1 Inhaler, Refills: 0    atorvastatin (LIPITOR) 40 MG tablet Take 40 mg by mouth daily.    metoprolol succinate (TOPROL-XL) 25 MG 24 hr tablet Take 12.5 mg by mouth daily.     Omega-3 Fatty Acids (FISH OIL) 1000 MG CAPS Take 1 capsule by mouth daily.    palbociclib (IBRANCE) 125 MG capsule Take 125 mg by mouth daily with breakfast. Take daily for three weeks and stop for one week. Take whole with food.    vitamin B-12 (CYANOCOBALAMIN) 1000 MCG tablet Take 1,000 mcg by mouth daily.    Vitamin D, Ergocalciferol, (DRISDOL) 50000 UNITS CAPS capsule Take 50,000 Units by mouth every 7 (seven) days. Pt takes on Monday.    benzonatate (TESSALON PERLES) 100 MG capsule Take 1 capsule (100 mg total) by mouth every 6 (six) hours as needed for cough. Qty: 30 capsule, Refills: 0    guaiFENesin-dextromethorphan (ROBITUSSIN DM) 100-10 MG/5ML syrup Take 5 mLs by mouth every 4 (four) hours as needed for cough. Qty: 118 mL, Refills: 0    living well with diabetes book MISC 1 each by Does not apply route once. Qty: 1 each, Refills: 0    pseudoephedrine-codeine-guaifenesin (CHERATUSSIN DAC) 30-10-100 MG/5ML solution Take 5 - 10 mL orally 4 times a day as needed for cough. Qty: 240 mL, Refills: 0   Associated Diagnoses: Breast CA, unspecified laterality    traMADol (ULTRAM) 50 MG tablet Take 1 tablet (50 mg total) by mouth every 8 (eight) hours as needed. Qty: 12 tablet, Refills: 0      STOP taking these medications     furosemide (LASIX) 20 MG tablet      ciprofloxacin (CIPRO) 500 MG tablet          DISCHARGE INSTRUCTIONS:    If you experience worsening of your admission symptoms, develop shortness of breath, life threatening emergency, suicidal or homicidal thoughts you must seek medical attention immediately by calling 911 or calling your MD immediately  if symptoms less severe.  You Must read complete instructions/literature along with all the possible adverse reactions/side effects for all the Medicines you take and that have been prescribed to you. Take any new Medicines after you have completely understood and accept all the possible adverse reactions/side  effects.   Please note  You were cared for by a hospitalist during your hospital stay. If you have any questions about your discharge medications or the care you received while you were in the hospital after you are discharged, you can call the unit and asked to speak with the hospitalist on call if the hospitalist that took care of you is not available. Once you are discharged, your primary care physician will handle any further medical issues. Please note that NO REFILLS for any discharge medications will be authorized once you are discharged, as it is imperative that you return to your primary care physician (or establish a relationship with a primary care physician if you do not have one) for your aftercare needs so that they can reassess your need for medications and monitor your lab values.    Today   SUBJECTIVE   Substernal pain while eating.   VITAL SIGNS:  Blood pressure 112/40, pulse 87, temperature 98.3 F (36.8 C), temperature source Oral, resp. rate 19, height 5\' 4"  (1.626 m), weight 71.442  kg (157 lb 8 oz), SpO2 96 %.  I/O:    Intake/Output Summary (Last 24 hours) at 05/11/15 1334 Last data filed at 05/11/15 0915  Gross per 24 hour  Intake    560 ml  Output    800 ml  Net   -240 ml    PHYSICAL EXAMINATION:  GENERAL:  81 y.o.-year-old patient lying in the bed with no acute distress.  EYES: Pupils equal, round, reactive to light and accommodation. No scleral icterus. Extraocular muscles intact.  HEENT: Head atraumatic, normocephalic. Oropharynx and nasopharynx clear. Moist oral mucosa. NECK:  Supple, no jugular venous distention. No thyroid enlargement, no tenderness.  LUNGS: Normal breath sounds bilaterally, no wheezing, rales,rhonchi or crepitation. No use of accessory muscles of respiration.  CARDIOVASCULAR: S1, S2 normal. No murmurs, rubs, or gallops.  ABDOMEN: Soft, non-tender, non-distended. Bowel sounds present. No organomegaly or mass.  EXTREMITIES: No pedal  edema, cyanosis, or clubbing.  NEUROLOGIC: Cranial nerves II through XII are intact. Muscle strength 5/5 in all extremities. Sensation intact. Gait not checked.  PSYCHIATRIC: The patient is alert and oriented x 3.  SKIN: No obvious rash, lesion, or ulcer.   DATA REVIEW:   CBC  Recent Labs Lab 05/09/15 0736  WBC 11.1*  HGB 10.7*  HCT 31.8*  PLT 330    Chemistries   Recent Labs Lab 05/11/15 0806  NA 137  K 4.8  CL 105  CO2 25  GLUCOSE 177*  BUN 31*  CREATININE 1.45*  CALCIUM 7.8*    Cardiac Enzymes  Recent Labs Lab 05/09/15 1315  TROPONINI 0.06*    Microbiology Results  Results for orders placed or performed during the hospital encounter of 05/08/15  KOH prep     Status: None   Collection Time: 05/10/15  4:39 PM  Result Value Ref Range Status   Specimen Description ESOPHAGUS  Final   Special Requests NONE  Final   KOH Prep POS BUDDING YEAST SEEN  Final   Report Status 05/10/2015 FINAL  Final  Fungus Culture with Smear     Status: None (Preliminary result)   Collection Time: 05/10/15  5:07 PM  Result Value Ref Range Status   Specimen Description ENDOTRACHEAL  Final   Special Requests NONE  Final   Culture NO GROWTH < 24 HOURS  Final   Report Status PENDING  Incomplete    RADIOLOGY:  No results found.     I discussed with the Dr. Allen Norris. Management plans discussed with the patient, her husband and son and they are in agreement.  CODE STATUS:     Code Status Orders        Start     Ordered   05/09/15 1610  Do not attempt resuscitation (DNR)   Continuous    Question Answer Comment  In the event of cardiac or respiratory ARREST Do not call a "code blue"   In the event of cardiac or respiratory ARREST Do not perform Intubation, CPR, defibrillation or ACLS   In the event of cardiac or respiratory ARREST Use medication by any route, position, wound care, and other measures to relive pain and suffering. May use oxygen, suction and manual treatment of  airway obstruction as needed for comfort.      05/09/15 1514    Advance Directive Documentation        Most Recent Value   Type of Advance Directive  Healthcare Power of Attorney, Living will   Pre-existing out of facility DNR order (yellow form or pink  MOST form)     "MOST" Form in Place?        TOTAL TIME TAKING CARE OF THIS PATIENT: 45 minutes.    Demetrios Loll M.D on 05/11/2015 at 1:34 PM  Between 7am to 6pm - Pager - [phone]  After 6pm go to [email or web address] - password [email or web address] [facility]  Office  [phone]  CC: Primary care physician; Dion Body, MD

## 2015-05-16 ENCOUNTER — Inpatient Hospital Stay: Payer: Medicare Other | Attending: Internal Medicine

## 2015-05-16 DIAGNOSIS — Z79899 Other long term (current) drug therapy: Secondary | ICD-10-CM | POA: Insufficient documentation

## 2015-05-16 DIAGNOSIS — Z923 Personal history of irradiation: Secondary | ICD-10-CM | POA: Diagnosis not present

## 2015-05-16 DIAGNOSIS — R05 Cough: Secondary | ICD-10-CM | POA: Diagnosis not present

## 2015-05-16 DIAGNOSIS — R630 Anorexia: Secondary | ICD-10-CM | POA: Diagnosis not present

## 2015-05-16 DIAGNOSIS — I509 Heart failure, unspecified: Secondary | ICD-10-CM | POA: Insufficient documentation

## 2015-05-16 DIAGNOSIS — Z170 Estrogen receptor positive status [ER+]: Secondary | ICD-10-CM | POA: Diagnosis not present

## 2015-05-16 DIAGNOSIS — R0602 Shortness of breath: Secondary | ICD-10-CM | POA: Diagnosis not present

## 2015-05-16 DIAGNOSIS — R7989 Other specified abnormal findings of blood chemistry: Secondary | ICD-10-CM | POA: Diagnosis not present

## 2015-05-16 DIAGNOSIS — D696 Thrombocytopenia, unspecified: Secondary | ICD-10-CM | POA: Diagnosis not present

## 2015-05-16 DIAGNOSIS — M129 Arthropathy, unspecified: Secondary | ICD-10-CM | POA: Insufficient documentation

## 2015-05-16 DIAGNOSIS — R5383 Other fatigue: Secondary | ICD-10-CM | POA: Diagnosis not present

## 2015-05-16 DIAGNOSIS — Z9221 Personal history of antineoplastic chemotherapy: Secondary | ICD-10-CM | POA: Insufficient documentation

## 2015-05-16 DIAGNOSIS — C50919 Malignant neoplasm of unspecified site of unspecified female breast: Secondary | ICD-10-CM

## 2015-05-16 DIAGNOSIS — I2510 Atherosclerotic heart disease of native coronary artery without angina pectoris: Secondary | ICD-10-CM | POA: Insufficient documentation

## 2015-05-16 DIAGNOSIS — R197 Diarrhea, unspecified: Secondary | ICD-10-CM | POA: Insufficient documentation

## 2015-05-16 DIAGNOSIS — Z9011 Acquired absence of right breast and nipple: Secondary | ICD-10-CM | POA: Diagnosis not present

## 2015-05-16 DIAGNOSIS — R1011 Right upper quadrant pain: Secondary | ICD-10-CM | POA: Diagnosis not present

## 2015-05-16 DIAGNOSIS — Z794 Long term (current) use of insulin: Secondary | ICD-10-CM | POA: Insufficient documentation

## 2015-05-16 DIAGNOSIS — Z95810 Presence of automatic (implantable) cardiac defibrillator: Secondary | ICD-10-CM | POA: Diagnosis not present

## 2015-05-16 DIAGNOSIS — E119 Type 2 diabetes mellitus without complications: Secondary | ICD-10-CM | POA: Diagnosis not present

## 2015-05-16 DIAGNOSIS — M8580 Other specified disorders of bone density and structure, unspecified site: Secondary | ICD-10-CM | POA: Insufficient documentation

## 2015-05-16 DIAGNOSIS — I10 Essential (primary) hypertension: Secondary | ICD-10-CM | POA: Insufficient documentation

## 2015-05-16 DIAGNOSIS — C787 Secondary malignant neoplasm of liver and intrahepatic bile duct: Secondary | ICD-10-CM | POA: Insufficient documentation

## 2015-05-16 DIAGNOSIS — K259 Gastric ulcer, unspecified as acute or chronic, without hemorrhage or perforation: Secondary | ICD-10-CM | POA: Insufficient documentation

## 2015-05-16 DIAGNOSIS — C50911 Malignant neoplasm of unspecified site of right female breast: Secondary | ICD-10-CM | POA: Insufficient documentation

## 2015-05-16 DIAGNOSIS — C7802 Secondary malignant neoplasm of left lung: Secondary | ICD-10-CM | POA: Insufficient documentation

## 2015-05-16 DIAGNOSIS — D72819 Decreased white blood cell count, unspecified: Secondary | ICD-10-CM | POA: Insufficient documentation

## 2015-05-16 DIAGNOSIS — R531 Weakness: Secondary | ICD-10-CM | POA: Diagnosis not present

## 2015-05-16 DIAGNOSIS — D649 Anemia, unspecified: Secondary | ICD-10-CM | POA: Insufficient documentation

## 2015-05-16 DIAGNOSIS — R109 Unspecified abdominal pain: Secondary | ICD-10-CM | POA: Insufficient documentation

## 2015-05-16 DIAGNOSIS — N289 Disorder of kidney and ureter, unspecified: Secondary | ICD-10-CM | POA: Insufficient documentation

## 2015-05-16 LAB — MAGNESIUM: MAGNESIUM: 2.1 mg/dL (ref 1.7–2.4)

## 2015-05-16 LAB — BASIC METABOLIC PANEL
Anion gap: 9 (ref 5–15)
BUN: 39 mg/dL — ABNORMAL HIGH (ref 6–20)
CALCIUM: 7.9 mg/dL — AB (ref 8.9–10.3)
CO2: 24 mmol/L (ref 22–32)
CREATININE: 2.14 mg/dL — AB (ref 0.44–1.00)
Chloride: 103 mmol/L (ref 101–111)
GFR calc non Af Amer: 21 mL/min — ABNORMAL LOW (ref >60)
GFR, EST AFRICAN AMERICAN: 24 mL/min — AB (ref >60)
Glucose, Bld: 338 mg/dL — ABNORMAL HIGH (ref 65–99)
Potassium: 4.3 mmol/L (ref 3.5–5.1)
Sodium: 136 mmol/L (ref 135–145)

## 2015-05-16 LAB — PHOSPHORUS: PHOSPHORUS: 3.7 mg/dL (ref 2.5–4.6)

## 2015-05-16 LAB — CBC WITH DIFFERENTIAL/PLATELET
BASOS PCT: 1 %
Basophils Absolute: 0 10*3/uL (ref 0–0.1)
EOS ABS: 0 10*3/uL (ref 0–0.7)
EOS PCT: 0 %
HEMATOCRIT: 30.3 % — AB (ref 35.0–47.0)
Hemoglobin: 10 g/dL — ABNORMAL LOW (ref 12.0–16.0)
Lymphocytes Relative: 45 %
Lymphs Abs: 2.1 10*3/uL (ref 1.0–3.6)
MCH: 30.7 pg (ref 26.0–34.0)
MCHC: 33.1 g/dL (ref 32.0–36.0)
MCV: 92.7 fL (ref 80.0–100.0)
MONO ABS: 0.1 10*3/uL — AB (ref 0.2–0.9)
MONOS PCT: 3 %
Neutro Abs: 2.4 10*3/uL (ref 1.4–6.5)
Neutrophils Relative %: 51 %
PLATELETS: 102 10*3/uL — AB (ref 150–440)
RBC: 3.27 MIL/uL — ABNORMAL LOW (ref 3.80–5.20)
RDW: 17.3 % — AB (ref 11.5–14.5)
WBC: 4.7 10*3/uL (ref 3.6–11.0)

## 2015-05-16 LAB — HEPATIC FUNCTION PANEL
ALBUMIN: 1.9 g/dL — AB (ref 3.5–5.0)
ALT: 45 U/L (ref 14–54)
AST: 65 U/L — ABNORMAL HIGH (ref 15–41)
Alkaline Phosphatase: 667 U/L — ABNORMAL HIGH (ref 38–126)
BILIRUBIN DIRECT: 1.3 mg/dL — AB (ref 0.1–0.5)
BILIRUBIN TOTAL: 2.4 mg/dL — AB (ref 0.3–1.2)
Indirect Bilirubin: 1.1 mg/dL — ABNORMAL HIGH (ref 0.3–0.9)
Total Protein: 8.1 g/dL (ref 6.5–8.1)

## 2015-05-19 ENCOUNTER — Inpatient Hospital Stay: Payer: Medicare Other

## 2015-05-19 ENCOUNTER — Inpatient Hospital Stay (HOSPITAL_BASED_OUTPATIENT_CLINIC_OR_DEPARTMENT_OTHER): Payer: Medicare Other | Admitting: Internal Medicine

## 2015-05-19 VITALS — BP 111/72 | HR 92 | Temp 96.0°F | Resp 18 | Ht 64.0 in

## 2015-05-19 DIAGNOSIS — C50919 Malignant neoplasm of unspecified site of unspecified female breast: Secondary | ICD-10-CM

## 2015-05-19 DIAGNOSIS — Z923 Personal history of irradiation: Secondary | ICD-10-CM

## 2015-05-19 DIAGNOSIS — Z79899 Other long term (current) drug therapy: Secondary | ICD-10-CM

## 2015-05-19 DIAGNOSIS — Z170 Estrogen receptor positive status [ER+]: Secondary | ICD-10-CM | POA: Diagnosis not present

## 2015-05-19 DIAGNOSIS — I2510 Atherosclerotic heart disease of native coronary artery without angina pectoris: Secondary | ICD-10-CM

## 2015-05-19 DIAGNOSIS — R5383 Other fatigue: Secondary | ICD-10-CM

## 2015-05-19 DIAGNOSIS — Z794 Long term (current) use of insulin: Secondary | ICD-10-CM

## 2015-05-19 DIAGNOSIS — Z9221 Personal history of antineoplastic chemotherapy: Secondary | ICD-10-CM

## 2015-05-19 DIAGNOSIS — C7802 Secondary malignant neoplasm of left lung: Secondary | ICD-10-CM

## 2015-05-19 DIAGNOSIS — R0602 Shortness of breath: Secondary | ICD-10-CM

## 2015-05-19 DIAGNOSIS — C787 Secondary malignant neoplasm of liver and intrahepatic bile duct: Secondary | ICD-10-CM

## 2015-05-19 DIAGNOSIS — C50911 Malignant neoplasm of unspecified site of right female breast: Secondary | ICD-10-CM | POA: Diagnosis not present

## 2015-05-19 DIAGNOSIS — M129 Arthropathy, unspecified: Secondary | ICD-10-CM

## 2015-05-19 DIAGNOSIS — I10 Essential (primary) hypertension: Secondary | ICD-10-CM

## 2015-05-19 DIAGNOSIS — C649 Malignant neoplasm of unspecified kidney, except renal pelvis: Secondary | ICD-10-CM

## 2015-05-19 DIAGNOSIS — M8580 Other specified disorders of bone density and structure, unspecified site: Secondary | ICD-10-CM

## 2015-05-19 DIAGNOSIS — Z95810 Presence of automatic (implantable) cardiac defibrillator: Secondary | ICD-10-CM

## 2015-05-19 DIAGNOSIS — I509 Heart failure, unspecified: Secondary | ICD-10-CM

## 2015-05-19 DIAGNOSIS — N289 Disorder of kidney and ureter, unspecified: Secondary | ICD-10-CM

## 2015-05-19 DIAGNOSIS — Z9011 Acquired absence of right breast and nipple: Secondary | ICD-10-CM

## 2015-05-19 DIAGNOSIS — R1011 Right upper quadrant pain: Secondary | ICD-10-CM

## 2015-05-19 DIAGNOSIS — E119 Type 2 diabetes mellitus without complications: Secondary | ICD-10-CM

## 2015-05-19 LAB — CBC WITH DIFFERENTIAL/PLATELET
Basophils Absolute: 0 10*3/uL (ref 0–0.1)
Basophils Relative: 1 %
EOS ABS: 0 10*3/uL (ref 0–0.7)
Eosinophils Relative: 1 %
HEMATOCRIT: 30.2 % — AB (ref 35.0–47.0)
HEMOGLOBIN: 10 g/dL — AB (ref 12.0–16.0)
LYMPHS ABS: 1.8 10*3/uL (ref 1.0–3.6)
LYMPHS PCT: 56 %
MCH: 30.9 pg (ref 26.0–34.0)
MCHC: 33.2 g/dL (ref 32.0–36.0)
MCV: 93.2 fL (ref 80.0–100.0)
MONOS PCT: 3 %
Monocytes Absolute: 0.1 10*3/uL — ABNORMAL LOW (ref 0.2–0.9)
NEUTROS PCT: 39 %
Neutro Abs: 1.2 10*3/uL — ABNORMAL LOW (ref 1.4–6.5)
Platelets: 57 10*3/uL — ABNORMAL LOW (ref 150–440)
RBC: 3.24 MIL/uL — AB (ref 3.80–5.20)
RDW: 17 % — ABNORMAL HIGH (ref 11.5–14.5)
WBC: 3.1 10*3/uL — AB (ref 3.6–11.0)

## 2015-05-19 LAB — BASIC METABOLIC PANEL
Anion gap: 7 (ref 5–15)
BUN: 30 mg/dL — AB (ref 6–20)
CHLORIDE: 100 mmol/L — AB (ref 101–111)
CO2: 26 mmol/L (ref 22–32)
CREATININE: 1.64 mg/dL — AB (ref 0.44–1.00)
Calcium: 7.8 mg/dL — ABNORMAL LOW (ref 8.9–10.3)
GFR calc Af Amer: 33 mL/min — ABNORMAL LOW (ref >60)
GFR calc non Af Amer: 28 mL/min — ABNORMAL LOW (ref >60)
GLUCOSE: 226 mg/dL — AB (ref 65–99)
POTASSIUM: 4.3 mmol/L (ref 3.5–5.1)
SODIUM: 133 mmol/L — AB (ref 135–145)

## 2015-05-19 MED ORDER — SODIUM CHLORIDE 0.45 % IV SOLN
Freq: Once | INTRAVENOUS | Status: AC
  Administered 2015-05-19: 12:00:00 via INTRAVENOUS
  Filled 2015-05-19: qty 1000

## 2015-05-19 NOTE — Progress Notes (Signed)
Patient is here for follow-up and possible IV fluids. Patient states that she has constant pain in her back and chest and rates her pain a 8/10. She states that Dr. Allen Norris did a scopy on her and found that she has ulcers and has given her a couple of medications to help. She states that they have given her a list of things that she should and should not eat or drink. She states that appetite has been "fair" and thinks that she is eating a little better.

## 2015-05-20 NOTE — Progress Notes (Addendum)
[place]  Telephone:(336) [phone] Fax:(336) [phone]     ID: Karma Ganja OB: 02/01/1933  MR#: [id number]  BSJ#:[id number]  Patient Care Team: Dion Body, MD as PCP - General (Family Medicine)  CHIEF COMPLAINT/DIAGNOSIS:  Stage IV metastatic breast cancer with liver metastasis diagnosed on 03/27/15 (Ultrasound-guided liver biopsy Pathology Report on 03/27/15.  DIAGNOSIS:  A. LIVER, LEFT LOBE; BIOPSY:  METASTATIC MAMMARY CARCINOMA. Comment: The patient has a known history of breast cancer and the current findings of liver and lung lesions are noted. A limited panel of immunohistochemical stains was performed to determine the site of origin. The tumor shows diffuse intense nuclear immunoreactivity for GATA3. TTF-1 is negative. The morphologic and immunophenotypic features are consistent with a breast primary. The control slides stain appropriately. ER positive (>90%), PR positive (>90%), and HER-2/neu equivocal (2+ on IHC, FISH negative, Her2/CEP17 ratio 1.13).   (Previously T3/T4, N1, M0 (stage IIIB) 5-6 cm right breast invasive ductal carcinoma. PET Scan shows uptake in the site of primary breast tumor and also medially under the first/second rib, possibly internal mammary lymph node area. ER and PR positive (both greater than 90%).   HER-2/neu 2+, NEGATIVE on gene amplification. Received neoadjuvant chemotherapy - completed Adriamycin/Cytoxan x 4, then Taxol weekly x8 doses (discontinued due to severe neuropathy). April 04, 2008 - RIGHT BREAST, MODIFIED RADICAL MASTECTOMY: INVASIVE DUCTAL CARCINOMA, 3.6 CM. METASTATIC CARCINOMA IN 2 OF 11 AXILLARY LYMPH NODES, LARGER MEASURES 0.9 CM, WITH EXTRANODAL EXTENSION.  Then completed radiation. Started hormonal therapy with Femara November 2009).  Started Faslodex 04/04/15, also started [place].   HISTORY OF PRESENT ILLNESS:  Patient seen as work in appointment today, she had labs done 2 days ago and creatinine had increased up  to 2.14. Patient was reportedly advised to discontinue Lasix upon recent hospital discharge but she had continued taking it, states that she has stopped it now. She is trying to increase oral water intake as much as possible. She does feel fatigued and occasionally mild lightheaded on getting up and ambulating. Creatinine today is still abnormal but better at 1.64, she is agreeable to take 1 L of IV fluids. No nausea or vomiting. No diarrhea. She has intermittent mild right upper quadrant pain. Denies nausea vomiting or diarrhea. Chronic arthritis symptoms in hands is same. She has dyspnea on exertion.    REVIEW OF SYSTEMS:   ROS As in HPI above. In addition, no fever, chills. No new headaches or focal weakness.  No new sore throat or dysphagia. No new skin rash or bleeding symptoms. Has chronic paresthesias in extremities. No seizures or loss of consciousness. PS ECOG 3.  PAST MEDICAL HISTORY: Reviewed. Past Medical History  Diagnosis Date  . Diabetes mellitus without complication   . Hypertension   . Coronary artery disease   . Cancer   . CHF (congestive heart failure)   . AICD (automatic cardioverter/defibrillator) present   . Shortness of breath dyspnea   . Breast cancer right mastectomy    PAST SURGICAL HISTORY: Reviewed. Past Surgical History  Procedure Laterality Date  . Cardiac defibrillator placement Left   . Mastectomy Right   . Joint replacement Right     knee  . Esophagogastroduodenoscopy (egd) with propofol N/A 05/10/2015    Procedure: ESOPHAGOGASTRODUODENOSCOPY (EGD) WITH PROPOFOL;  Surgeon: Lucilla Lame, MD;  Location: [facility] ENDOSCOPY;  Service: Endoscopy;  Laterality: N/A;    FAMILY HISTORY: Reviewed. Family History  Problem Relation Age of Onset  . Alzheimer's disease Mother   .  Congestive Heart Failure Father     SOCIAL HISTORY: Reviewed. Social History  Substance Use Topics  . Smoking status: Never Smoker   . Smokeless tobacco: Never Used  . Alcohol Use: No      Allergies  Allergen Reactions  . Metformin Nausea And Vomiting  . Codeine Rash  . Eliquis [Apixaban] Rash and Other (See Comments)    Reaction:  Bleeding     Current Outpatient Prescriptions  Medication Sig Dispense Refill  . albuterol (PROVENTIL HFA;VENTOLIN HFA) 108 (90 BASE) MCG/ACT inhaler Inhale 2 puffs into the lungs every 6 (six) hours as needed for wheezing or shortness of breath. 1 Inhaler 0  . atorvastatin (LIPITOR) 40 MG tablet Take 40 mg by mouth daily.    . benzonatate (TESSALON PERLES) 100 MG capsule Take 1 capsule (100 mg total) by mouth every 6 (six) hours as needed for cough. 30 capsule 0  . fluconazole (DIFLUCAN) 100 MG tablet Take 1 tablet (100 mg total) by mouth daily. 20 tablet 0  . guaiFENesin-dextromethorphan (ROBITUSSIN DM) 100-10 MG/5ML syrup Take 5 mLs by mouth every 4 (four) hours as needed for cough. 118 mL 0  . Insulin Lispro Prot & Lispro (HUMALOG 75/25 MIX) (75-25) 100 UNIT/ML Kwikpen Inject 10 Units into the skin 2 (two) times daily. 15 mL 0  . living well with diabetes book MISC 1 each by Does not apply route once. 1 each 0  . metoprolol succinate (TOPROL-XL) 25 MG 24 hr tablet Take 12.5 mg by mouth daily.    . Omega-3 Fatty Acids (FISH OIL) 1000 MG CAPS Take 1 capsule by mouth daily.    . palbociclib (IBRANCE) 125 MG capsule Take 125 mg by mouth daily with breakfast. Take daily for three weeks and stop for one week. Take whole with food.    . pantoprazole (PROTONIX) 40 MG tablet Take 1 tablet (40 mg total) by mouth daily. 60 tablet 0  . pseudoephedrine-codeine-guaifenesin (CHERATUSSIN DAC) 30-10-100 MG/5ML solution Take 5 - 10 mL orally 4 times a day as needed for cough. 240 mL 0  . traMADol (ULTRAM) 50 MG tablet Take 1 tablet (50 mg total) by mouth every 8 (eight) hours as needed. 12 tablet 0  . vitamin B-12 (CYANOCOBALAMIN) 1000 MCG tablet Take 1,000 mcg by mouth daily.    . Vitamin D, Ergocalciferol, (DRISDOL) 50000 UNITS CAPS capsule Take 50,000  Units by mouth every 7 (seven) days. Pt takes on Monday.     No current facility-administered medications for this visit.    PHYSICAL EXAM: Filed Vitals:   05/19/15 0954  BP: 111/72  Pulse: 92  Temp: 96 F (35.6 C)  Resp: 18     There is no weight on file to calculate BMI.    ECOG FS:3 - Symptomatic, >50% confined to bed  GENERAL: Patient is chronically weak-looking, otherwise alert and oriented and in no acute distress. No icterus. HEENT: EOMs intact. Oral exam negative for thrush, mouth is dry.   CVS: S1S2, regular LUNGS: Bilaterally clear to auscultation, no rhonchi. ABDOMEN: Soft, mild epigastric tenderness.    EXTREMITIES: No pedal edema.  LAB RESULTS:    Component Value Date/Time   NA 133* 05/19/2015 0918   NA 138 08/27/2014 0500   K 4.3 05/19/2015 0918   K 4.1 08/27/2014 0500   CL 100* 05/19/2015 0918   CL 105 08/27/2014 0500   CO2 26 05/19/2015 0918   CO2 25 08/27/2014 0500   GLUCOSE 226* 05/19/2015 0918   GLUCOSE 63* 08/27/2014  0500   BUN 30* 05/19/2015 0918   BUN 23* 08/27/2014 0500   CREATININE 1.64* 05/19/2015 0918   CREATININE 1.21 08/27/2014 0500   CALCIUM 7.8* 05/19/2015 0918   CALCIUM 9.0 08/27/2014 0500   PROT 8.1 05/16/2015 1434   PROT 8.1 08/24/2014 1252   ALBUMIN 1.9* 05/16/2015 1434   ALBUMIN 2.9* 08/24/2014 1252   AST 65* 05/16/2015 1434   AST 45* 08/24/2014 1252   ALT 45 05/16/2015 1434   ALT 38 08/24/2014 1252   ALKPHOS 667* 05/16/2015 1434   ALKPHOS 226* 08/24/2014 1252   BILITOT 2.4* 05/16/2015 1434   BILITOT 0.4 08/24/2014 1252   GFRNONAA 28* 05/19/2015 0918   GFRNONAA 46* 08/27/2014 0500   GFRNONAA 50* 03/16/2014 0926   GFRAA 33* 05/19/2015 0918   GFRAA 55* 08/27/2014 0500   GFRAA 58* 03/16/2014 0926    Lab Results  Component Value Date   WBC 3.1* 05/19/2015   NEUTROABS 1.2* 05/19/2015   HGB 10.0* 05/19/2015   HCT 30.2* 05/19/2015   MCV 93.2 05/19/2015   PLT 57* 05/19/2015     STUDIES: 05/27/13 - DEXA scan reported  Osteopenia. The T-score left femoral neck -1.2 (was -0.5 in October 2012). Otherwise mostly unremarkable.  03/15/15 - CT Chest.  IMPRESSION:  1. Negative for pulmonary embolus or acute aortic abnormality.  2. Atherosclerosis and coronary artery disease. 3. RIGHT mastectomy with radiation fibrosis in the anterior RIGHT upper lobe.  4. Mediastinal and hilar adenopathy with pleural nodularity, most compatible with lymphangitic spread of tumor. Infiltration of the liver compatible with metastatic disease.  03/27/15 - Ultrasound-guided liver biopsy Pathology Report.  DIAGNOSIS:  A. LIVER, LEFT LOBE; BIOPSY:  METASTATIC MAMMARY CARCINOMA, SEE COMMENT.  Comment: The patient has a known history of breast cancer and the current findings of liver and lung lesions are noted. A limited panel of immunohistochemical stains was performed to determine the site of origin. The tumor shows diffuse intense nuclear immunoreactivity for GATA3. TTF-1 is negative. The morphologic and immunophenotypic features are consistent with a breast primary. The control slides stain appropriately. ER positive (>90%), PR positive (>90%), and HER-2/neu equivocal (2+ on IHC, FISH pending).     ASSESSMENT / PLAN:   1. Stage IV metastatic breast cancer with liver metastasis diagnosed on 03/27/15 (Ultrasound-guided liver biopsy Pathology Report on 03/27/15.  DIAGNOSIS:  A. LIVER, LEFT LOBE; BIOPSY:  METASTATIC MAMMARY CARCINOMA. Comment: The patient has a known history of breast cancer and the current findings of liver and lung lesions are noted. A limited panel of immunohistochemical stains was performed to determine the site of origin. The tumor shows diffuse intense nuclear immunoreactivity for GATA3. TTF-1 is negative. The morphologic and immunophenotypic features are consistent with a breast primary. The control slides stain appropriately. ER positive (>90%), PR positive (>90%), and HER-2/neu equivocal (2+ on IHC, FISH pending). Previously has  known h/o stage IIIB left breast cancer as described above. Currently on treatment with faslodex and ibrance    -   acute worsening of renal insufficiency, creatinine today has improved from 2.14 to 1.64 after stopping Lasix. Patient is trying to increase oral fluid intake, still has symptoms of my clinical dehydration, she is agreeable to 1 L IV fluids, will pursue half-normal saline 1 L over 2 hours today. Also, platelet count has dropped quickly to 57 from 102, no bleeding symptoms, possibly Ibrance effect, she was advised to hold this and further decision will need to be made next week after she gets repeat blood counts.  2. Anemia  - mild, continue to monitor at this time.  3. Pain  - continue tramadol when necessary, patient advised to notify if pain worsens.  4. In between visits, she was advised to call or come. In case of any worsening symptoms or acute sickness. Patient is agreeable to this plan.     Leia Alf, MD   05/20/2015 8:30 AM

## 2015-05-24 ENCOUNTER — Other Ambulatory Visit: Payer: Self-pay | Admitting: *Deleted

## 2015-05-24 ENCOUNTER — Inpatient Hospital Stay (HOSPITAL_BASED_OUTPATIENT_CLINIC_OR_DEPARTMENT_OTHER): Payer: Medicare Other | Admitting: Internal Medicine

## 2015-05-24 ENCOUNTER — Encounter: Payer: Self-pay | Admitting: Internal Medicine

## 2015-05-24 ENCOUNTER — Inpatient Hospital Stay: Payer: Medicare Other

## 2015-05-24 VITALS — BP 116/77 | HR 101 | Temp 97.8°F | Resp 20 | Wt 149.3 lb

## 2015-05-24 DIAGNOSIS — R630 Anorexia: Secondary | ICD-10-CM

## 2015-05-24 DIAGNOSIS — M129 Arthropathy, unspecified: Secondary | ICD-10-CM

## 2015-05-24 DIAGNOSIS — C50911 Malignant neoplasm of unspecified site of right female breast: Secondary | ICD-10-CM | POA: Diagnosis not present

## 2015-05-24 DIAGNOSIS — C787 Secondary malignant neoplasm of liver and intrahepatic bile duct: Secondary | ICD-10-CM

## 2015-05-24 DIAGNOSIS — C50919 Malignant neoplasm of unspecified site of unspecified female breast: Secondary | ICD-10-CM

## 2015-05-24 DIAGNOSIS — R7989 Other specified abnormal findings of blood chemistry: Secondary | ICD-10-CM

## 2015-05-24 DIAGNOSIS — C50811 Malignant neoplasm of overlapping sites of right female breast: Secondary | ICD-10-CM

## 2015-05-24 DIAGNOSIS — Z95810 Presence of automatic (implantable) cardiac defibrillator: Secondary | ICD-10-CM

## 2015-05-24 DIAGNOSIS — Z170 Estrogen receptor positive status [ER+]: Secondary | ICD-10-CM

## 2015-05-24 DIAGNOSIS — D702 Other drug-induced agranulocytosis: Secondary | ICD-10-CM

## 2015-05-24 DIAGNOSIS — I10 Essential (primary) hypertension: Secondary | ICD-10-CM

## 2015-05-24 DIAGNOSIS — R5383 Other fatigue: Secondary | ICD-10-CM

## 2015-05-24 DIAGNOSIS — Z9011 Acquired absence of right breast and nipple: Secondary | ICD-10-CM

## 2015-05-24 DIAGNOSIS — R1011 Right upper quadrant pain: Secondary | ICD-10-CM

## 2015-05-24 DIAGNOSIS — Z923 Personal history of irradiation: Secondary | ICD-10-CM

## 2015-05-24 DIAGNOSIS — C7802 Secondary malignant neoplasm of left lung: Secondary | ICD-10-CM | POA: Diagnosis not present

## 2015-05-24 DIAGNOSIS — E119 Type 2 diabetes mellitus without complications: Secondary | ICD-10-CM

## 2015-05-24 DIAGNOSIS — I2510 Atherosclerotic heart disease of native coronary artery without angina pectoris: Secondary | ICD-10-CM

## 2015-05-24 DIAGNOSIS — R05 Cough: Secondary | ICD-10-CM

## 2015-05-24 DIAGNOSIS — D72819 Decreased white blood cell count, unspecified: Secondary | ICD-10-CM

## 2015-05-24 DIAGNOSIS — K259 Gastric ulcer, unspecified as acute or chronic, without hemorrhage or perforation: Secondary | ICD-10-CM

## 2015-05-24 DIAGNOSIS — Z79899 Other long term (current) drug therapy: Secondary | ICD-10-CM

## 2015-05-24 DIAGNOSIS — M8580 Other specified disorders of bone density and structure, unspecified site: Secondary | ICD-10-CM

## 2015-05-24 DIAGNOSIS — Z9221 Personal history of antineoplastic chemotherapy: Secondary | ICD-10-CM

## 2015-05-24 DIAGNOSIS — R0602 Shortness of breath: Secondary | ICD-10-CM

## 2015-05-24 DIAGNOSIS — N289 Disorder of kidney and ureter, unspecified: Secondary | ICD-10-CM

## 2015-05-24 DIAGNOSIS — D649 Anemia, unspecified: Secondary | ICD-10-CM

## 2015-05-24 DIAGNOSIS — Z794 Long term (current) use of insulin: Secondary | ICD-10-CM

## 2015-05-24 DIAGNOSIS — I509 Heart failure, unspecified: Secondary | ICD-10-CM

## 2015-05-24 DIAGNOSIS — D696 Thrombocytopenia, unspecified: Secondary | ICD-10-CM

## 2015-05-24 DIAGNOSIS — R531 Weakness: Secondary | ICD-10-CM

## 2015-05-24 LAB — CBC WITH DIFFERENTIAL/PLATELET
Basophils Absolute: 0 10*3/uL (ref 0–0.1)
Basophils Relative: 1 %
EOS ABS: 0 10*3/uL (ref 0–0.7)
Eosinophils Relative: 0 %
HCT: 30.5 % — ABNORMAL LOW (ref 35.0–47.0)
HEMOGLOBIN: 10.5 g/dL — AB (ref 12.0–16.0)
LYMPHS ABS: 1.4 10*3/uL (ref 1.0–3.6)
LYMPHS PCT: 55 %
MCH: 32.2 pg (ref 26.0–34.0)
MCHC: 34.3 g/dL (ref 32.0–36.0)
MCV: 93.8 fL (ref 80.0–100.0)
MONOS PCT: 3 %
Monocytes Absolute: 0.1 10*3/uL — ABNORMAL LOW (ref 0.2–0.9)
NEUTROS PCT: 41 %
Neutro Abs: 1.1 10*3/uL — ABNORMAL LOW (ref 1.4–6.5)
Platelets: 56 10*3/uL — ABNORMAL LOW (ref 150–440)
RBC: 3.25 MIL/uL — AB (ref 3.80–5.20)
RDW: 17.5 % — ABNORMAL HIGH (ref 11.5–14.5)
WBC: 2.6 10*3/uL — AB (ref 3.6–11.0)

## 2015-05-24 LAB — HEPATIC FUNCTION PANEL
ALT: 61 U/L — ABNORMAL HIGH (ref 14–54)
AST: 112 U/L — ABNORMAL HIGH (ref 15–41)
Albumin: 2.1 g/dL — ABNORMAL LOW (ref 3.5–5.0)
Alkaline Phosphatase: 678 U/L — ABNORMAL HIGH (ref 38–126)
BILIRUBIN INDIRECT: 0.9 mg/dL (ref 0.3–0.9)
Bilirubin, Direct: 1.2 mg/dL — ABNORMAL HIGH (ref 0.1–0.5)
TOTAL PROTEIN: 7.9 g/dL (ref 6.5–8.1)
Total Bilirubin: 2.1 mg/dL — ABNORMAL HIGH (ref 0.3–1.2)

## 2015-05-24 LAB — BASIC METABOLIC PANEL
Anion gap: 8 (ref 5–15)
BUN: 26 mg/dL — AB (ref 6–20)
CHLORIDE: 98 mmol/L — AB (ref 101–111)
CO2: 25 mmol/L (ref 22–32)
CREATININE: 1.39 mg/dL — AB (ref 0.44–1.00)
Calcium: 8 mg/dL — ABNORMAL LOW (ref 8.9–10.3)
GFR calc Af Amer: 40 mL/min — ABNORMAL LOW (ref >60)
GFR calc non Af Amer: 35 mL/min — ABNORMAL LOW (ref >60)
Glucose, Bld: 248 mg/dL — ABNORMAL HIGH (ref 65–99)
POTASSIUM: 4.8 mmol/L (ref 3.5–5.1)
Sodium: 131 mmol/L — ABNORMAL LOW (ref 135–145)

## 2015-05-24 LAB — LACTATE DEHYDROGENASE: LDH: 441 U/L — AB (ref 98–192)

## 2015-05-24 NOTE — Progress Notes (Signed)
[place] OFFICE PROGRESS NOTE  Patient Care Team: Dion Body, MD as PCP - General (Family Medicine)   SUMMARY OF ONCOLOGIC HISTORY:  # AUG 2009-  BREAST CA STAGE III ER/PR POS; Her 2 NEG s/p Neo-adj ddAC-Tq Wx 8 [sec to PN]; s/p RT; Femara  # AUG 2016- METASTATIC BREAST CA [s/p Liver Bx] ER/PR>90%; Her 2 neu- NEG; MEDIA/Hilar/? Lymphangiatic;  START Darrick Meigs; OCT 13th 2016- START ERIBULIN   # ? METS to bone- L ant 8th & R pos 11th  # Renal failure [Creat ~1.5- 2.0]    INTERVAL HISTORY:  A very pleasant 81 year old female patient with above history of metastatic breast cancer to the liver/lung-lymphangitic spread ER/PR positive HER-2/neu negative currently on first line therapy with Faslodex and ibrance for the last 4-6 weeks is here for follow-up.  Patient complains of multiple problems- cough progressive nonproductive. No hemoptysis. Progressive shortness of breath. She is feels weak all over. She also complains of vague abdominal pain right upper quadrant/epigastric region. She recently had a EGD that showed ulcers in the stomach. She is following up with GI.  Patient's appetite is poor. Denies any vomiting. Denies any fevers. Denies any unusual headaches or vision changes or double vision.  REVIEW OF SYSTEMS:  A complete 10 point review of system is done which is negative except mentioned above/history of present illness.   PAST MEDICAL HISTORY :  Past Medical History  Diagnosis Date  . Diabetes mellitus without complication ([place])   . Hypertension   . Coronary artery disease   . Cancer ([place])   . CHF (congestive heart failure) ([place])   . AICD (automatic cardioverter/defibrillator) present   . Shortness of breath dyspnea   . Breast cancer ([place]) right mastectomy    PAST SURGICAL HISTORY :   Past Surgical History  Procedure Laterality Date  . Cardiac defibrillator placement Left   . Mastectomy Right   . Joint replacement Right     knee   . Esophagogastroduodenoscopy (egd) with propofol N/A 05/10/2015    Procedure: ESOPHAGOGASTRODUODENOSCOPY (EGD) WITH PROPOFOL;  Surgeon: Lucilla Lame, MD;  Location: [facility] ENDOSCOPY;  Service: Endoscopy;  Laterality: N/A;    FAMILY HISTORY :   Family History  Problem Relation Age of Onset  . Alzheimer's disease Mother   . Congestive Heart Failure Father     SOCIAL HISTORY:   Social History  Substance Use Topics  . Smoking status: Never Smoker   . Smokeless tobacco: Never Used  . Alcohol Use: No    ALLERGIES:  is allergic to metformin; codeine; and eliquis.  MEDICATIONS:  Current Outpatient Prescriptions  Medication Sig Dispense Refill  . albuterol (PROVENTIL HFA;VENTOLIN HFA) 108 (90 BASE) MCG/ACT inhaler Inhale 2 puffs into the lungs every 6 (six) hours as needed for wheezing or shortness of breath. 1 Inhaler 0  . atorvastatin (LIPITOR) 40 MG tablet Take 40 mg by mouth daily.    Marland Kitchen azithromycin (ZITHROMAX) 250 MG tablet Take 1 tablet by mouth daily.  0  . fluconazole (DIFLUCAN) 100 MG tablet Take 1 tablet (100 mg total) by mouth daily. 20 tablet 0  . guaiFENesin-dextromethorphan (ROBITUSSIN DM) 100-10 MG/5ML syrup Take 5 mLs by mouth every 4 (four) hours as needed for cough. 118 mL 0  . Insulin Lispro Prot & Lispro (HUMALOG 75/25 MIX) (75-25) 100 UNIT/ML Kwikpen Inject 10 Units into the skin 2 (two) times daily. 15 mL 0  . lisinopril (PRINIVIL,ZESTRIL) 5 MG tablet Take 1 tablet by mouth daily.  1  . metoprolol succinate (TOPROL-XL) 25 MG 24 hr tablet Take 12.5 mg by mouth daily.    . Omega-3 Fatty Acids (FISH OIL) 1000 MG CAPS Take 1 capsule by mouth daily.    . pantoprazole (PROTONIX) 40 MG tablet Take 1 tablet (40 mg total) by mouth daily. 60 tablet 0  . traMADol (ULTRAM) 50 MG tablet Take 1 tablet (50 mg total) by mouth every 8 (eight) hours as needed. 12 tablet 0  . vitamin B-12 (CYANOCOBALAMIN) 1000 MCG tablet Take 1,000 mcg by mouth daily.    . Vitamin D, Ergocalciferol,  (DRISDOL) 50000 UNITS CAPS capsule Take 50,000 Units by mouth every 7 (seven) days. Pt takes on Monday.    . benzonatate (TESSALON PERLES) 100 MG capsule Take 1 capsule (100 mg total) by mouth every 6 (six) hours as needed for cough. (Patient not taking: Reported on 05/24/2015) 30 capsule 0  . living well with diabetes book MISC 1 each by Does not apply route once. 1 each 0  . palbociclib (IBRANCE) 125 MG capsule Take 125 mg by mouth daily with breakfast. Take daily for three weeks and stop for one week. Take whole with food.    . pseudoephedrine-codeine-guaifenesin (CHERATUSSIN DAC) 30-10-100 MG/5ML solution Take 5 - 10 mL orally 4 times a day as needed for cough. (Patient not taking: Reported on 05/24/2015) 240 mL 0   No current facility-administered medications for this visit.    PHYSICAL EXAMINATION: ECOG PERFORMANCE STATUS: 2 - Symptomatic, <50% confined to bed  BP 116/77 mmHg  Pulse 101  Temp(Src) 97.8 F (36.6 C) (Oral)  Resp 20  Wt 149 lb 4 oz (67.7 kg)  SpO2 97%  Filed Weights   05/24/15 0946  Weight: 149 lb 4 oz (67.7 kg)    GENERAL: Well-nourished well-developed; Alert  Mild distress. She is accompanied by her husband.  She is able to sit on the exam table only with help. EYES:  Positive for pallor and icterus. OROPHARYNX: no thrush or ulceration; good dentition  NECK: supple, no masses felt LYMPH:  no palpable lymphadenopathy in the cervical, axillary or inguinal regions LUNGS:  Decreased breath sounds on the  Right side compared to the left.  No wheeze or crackles HEART/CVS: regular rate & rhythm and no murmurs; 2+ swelling in bil LE ABDOMEN:abdomen soft, non-tender and normal bowel sounds; 3-4 fingers breadth below the costal margin. Musculoskeletal:no cyanosis of digits and no clubbing  PSYCH: alert & oriented x 3 with fluent speech NEURO: no focal motor/sensory deficits SKIN:  no rashes or significant lesions  LABORATORY DATA:  I have reviewed the data as  listed    Component Value Date/Time   NA 131* 05/24/2015 0926   NA 138 08/27/2014 0500   K 4.8 05/24/2015 0926   K 4.1 08/27/2014 0500   CL 98* 05/24/2015 0926   CL 105 08/27/2014 0500   CO2 25 05/24/2015 0926   CO2 25 08/27/2014 0500   GLUCOSE 248* 05/24/2015 0926   GLUCOSE 63* 08/27/2014 0500   BUN 26* 05/24/2015 0926   BUN 23* 08/27/2014 0500   CREATININE 1.39* 05/24/2015 0926   CREATININE 1.21 08/27/2014 0500   CALCIUM 8.0* 05/24/2015 0926   CALCIUM 9.0 08/27/2014 0500   PROT 8.1 05/16/2015 1434   PROT 8.1 08/24/2014 1252   ALBUMIN 1.9* 05/16/2015 1434   ALBUMIN 2.9* 08/24/2014 1252   AST 65* 05/16/2015 1434   AST 45* 08/24/2014 1252   ALT 45 05/16/2015 1434   ALT 38 08/24/2014  5670   LIDCVUD 314* 05/16/2015 1434   ALKPHOS 226* 08/24/2014 1252   BILITOT 2.4* 05/16/2015 1434   BILITOT 0.4 08/24/2014 1252   GFRNONAA 35* 05/24/2015 0926   GFRNONAA 46* 08/27/2014 0500   GFRNONAA 50* 03/16/2014 0926   GFRAA 40* 05/24/2015 0926   GFRAA 55* 08/27/2014 0500   GFRAA 58* 03/16/2014 0926    No results found for: SPEP, UPEP  Lab Results  Component Value Date   WBC 2.6* 05/24/2015   NEUTROABS 1.1* 05/24/2015   HGB 10.5* 05/24/2015   HCT 30.5* 05/24/2015   MCV 93.8 05/24/2015   PLT 56* 05/24/2015      Chemistry      Component Value Date/Time   NA 131* 05/24/2015 0926   NA 138 08/27/2014 0500   K 4.8 05/24/2015 0926   K 4.1 08/27/2014 0500   CL 98* 05/24/2015 0926   CL 105 08/27/2014 0500   CO2 25 05/24/2015 0926   CO2 25 08/27/2014 0500   BUN 26* 05/24/2015 0926   BUN 23* 08/27/2014 0500   CREATININE 1.39* 05/24/2015 0926   CREATININE 1.21 08/27/2014 0500      Component Value Date/Time   CALCIUM 8.0* 05/24/2015 0926   CALCIUM 9.0 08/27/2014 0500   ALKPHOS 667* 05/16/2015 1434   ALKPHOS 226* 08/24/2014 1252   AST 65* 05/16/2015 1434   AST 45* 08/24/2014 1252   ALT 45 05/16/2015 1434   ALT 38 08/24/2014 1252   BILITOT 2.4* 05/16/2015 1434   BILITOT 0.4  08/24/2014 1252       RADIOGRAPHIC STUDIES: I have personally reviewed the radiological images as listed and agreed with the findings in the report. No results found.   ASSESSMENT & PLAN:  # METASTATIC BREAST CA- to the liver/lung? Lymphangitic spread- ER/PR positive HER-2/neu negative; currently on first line palliative therapy with Faslodex plus ibrance since August 2016.  # I would recommend reevaluation with tumor markers; imaging- especially given elevated LFTs;  Clinically given the concerns of progression-  I would recommend switching the therapy to Eribulin day 1 and day 8   Every 21 days.  Given the  Renal insufficiency/ elevated LFTs-  I would recommend  A lower dose of 1.1 mg/m D1 day 8.  I would recommend also adding Neupogen  Daily 5 days postchemotherapy.  I also discussed the potential side effects of  Eribulin  including but not limited to  Bone marrow suppression; Risk of infections and also  Bleeding from worsening thrombocytopenia.  # Thrombocytopenia platelets 57 / mild leukopenia with ANC of 1.2 - approximately 5 days ago- suspect from ibrance. Currently ibrance is on hold.  # Renal failure- creatinine 1.39/stable.   # Elevated LFTs- likely from ongoing malignancy; repeat LFT/PT PTT.    Unfortunately her prognosis is poor;  I suspect  Patient  We will not do well if she does not have a dramatic response to  Eribulin.  All questions were answered. The patient knows to call the clinic with any problems, questions or concerns. No barriers to learning was detected.  I spent 40 minutes counseling the patient face to face. The total time spent in the appointment was 60 minutes and more than 50% was on counseling and review of test results     Cammie Sickle, MD 05/24/2015 3:58 PM

## 2015-05-25 ENCOUNTER — Inpatient Hospital Stay: Payer: Medicare Other

## 2015-05-25 ENCOUNTER — Encounter: Payer: Self-pay | Admitting: *Deleted

## 2015-05-25 ENCOUNTER — Other Ambulatory Visit: Payer: Self-pay | Admitting: *Deleted

## 2015-05-25 VITALS — BP 104/65 | HR 78 | Temp 96.0°F | Resp 20

## 2015-05-25 DIAGNOSIS — R1310 Dysphagia, unspecified: Secondary | ICD-10-CM

## 2015-05-25 DIAGNOSIS — C50811 Malignant neoplasm of overlapping sites of right female breast: Secondary | ICD-10-CM

## 2015-05-25 DIAGNOSIS — C50911 Malignant neoplasm of unspecified site of right female breast: Secondary | ICD-10-CM | POA: Diagnosis not present

## 2015-05-25 DIAGNOSIS — C787 Secondary malignant neoplasm of liver and intrahepatic bile duct: Secondary | ICD-10-CM

## 2015-05-25 MED ORDER — SODIUM CHLORIDE 0.9 % IV SOLN
Freq: Once | INTRAVENOUS | Status: AC
  Administered 2015-05-25: 15:00:00 via INTRAVENOUS
  Filled 2015-05-25: qty 4

## 2015-05-25 MED ORDER — SODIUM CHLORIDE 0.9 % IV SOLN
Freq: Once | INTRAVENOUS | Status: AC
  Administered 2015-05-25: 15:00:00 via INTRAVENOUS
  Filled 2015-05-25: qty 1000

## 2015-05-25 MED ORDER — SUCRALFATE 1 GM/10ML PO SUSP: 1.0000 g | Freq: Three times a day (TID) | ORAL | Status: AC

## 2015-05-25 MED ORDER — SODIUM CHLORIDE 0.9 % IV SOLN
1.1000 mg/m2 | Freq: Once | INTRAVENOUS | Status: AC
  Administered 2015-05-25: 1.95 mg via INTRAVENOUS
  Filled 2015-05-25: qty 3.9

## 2015-05-26 ENCOUNTER — Other Ambulatory Visit: Payer: Self-pay | Admitting: Internal Medicine

## 2015-05-26 ENCOUNTER — Ambulatory Visit: Payer: Medicare Other

## 2015-05-26 ENCOUNTER — Inpatient Hospital Stay: Payer: Medicare Other

## 2015-05-26 ENCOUNTER — Other Ambulatory Visit: Payer: Self-pay | Admitting: *Deleted

## 2015-05-26 DIAGNOSIS — R1084 Generalized abdominal pain: Secondary | ICD-10-CM

## 2015-05-26 DIAGNOSIS — D702 Other drug-induced agranulocytosis: Secondary | ICD-10-CM

## 2015-05-26 DIAGNOSIS — C50421 Malignant neoplasm of upper-outer quadrant of right male breast: Secondary | ICD-10-CM

## 2015-05-26 DIAGNOSIS — C50911 Malignant neoplasm of unspecified site of right female breast: Secondary | ICD-10-CM | POA: Diagnosis not present

## 2015-05-26 LAB — CEA: CEA: 7.2 ng/mL — ABNORMAL HIGH (ref 0.0–4.7)

## 2015-05-26 LAB — CA 27.29 (SERIAL MONITOR): CA 27.29: 348.6 U/mL — AB (ref 0.0–38.6)

## 2015-05-26 MED ORDER — TRAMADOL HCL 50 MG PO TABS: 50.0000 mg | ORAL_TABLET | Freq: Three times a day (TID) | ORAL | Status: DC | PRN

## 2015-05-26 MED ORDER — TBO-FILGRASTIM 480 MCG/0.8ML ~~LOC~~ SOSY
480.0000 ug | PREFILLED_SYRINGE | Freq: Once | SUBCUTANEOUS | Status: AC
  Administered 2015-05-26: 480 ug via SUBCUTANEOUS
  Filled 2015-05-26: qty 0.8

## 2015-05-26 NOTE — Progress Notes (Signed)
Patient's family (husband, son and daughter in law) met with provider and nursing team to discuss patient's prognosis, concerns and current treatment plan. Daughter in law present as key historian.  Approximately 45 minutes spent with the family in discussion. Questions were answered to their satisfaction.    Daughter in law expressed concern over patient's hyperglycemia levels.  She expresses that patient's blood sugar levels will be too elevated for the scan. The patient's Blood Glucose is 248 (based on 05/24/15's lab counts).  I forwarded the patient's blood glucose levels to Dr. Netty Starring, the patient's primary care team, to have pmd or pa at the [place] office to manage the hyperglycemia. Patient is not on an insulin sliding scale is only taking humalog 75/25 dosing 10 units twice a day.  RN advocated for patient and asked pmd team to consider an adjustment in the insulin dosing.  Daughter in law also expressed concern that the patient "seems more depressed and more anxiously lately due to the disease progression." The daughter in law asked if a clinical social worker team member could reach out to the patient to discuss patient's concerns.  I will contact our social worker and arrange this for the patient.

## 2015-05-27 ENCOUNTER — Inpatient Hospital Stay: Payer: Medicare Other

## 2015-05-27 VITALS — BP 94/60 | HR 84 | Temp 99.0°F | Resp 20

## 2015-05-27 DIAGNOSIS — C50911 Malignant neoplasm of unspecified site of right female breast: Secondary | ICD-10-CM | POA: Diagnosis not present

## 2015-05-27 MED ORDER — TBO-FILGRASTIM 480 MCG/0.8ML ~~LOC~~ SOSY
480.0000 ug | PREFILLED_SYRINGE | Freq: Once | SUBCUTANEOUS | Status: AC
  Administered 2015-05-27: 480 ug via SUBCUTANEOUS

## 2015-05-27 NOTE — Progress Notes (Signed)
Pt presents to clinic this am for Granix 49mcg injection . Pt states she is having problems with her blood sugar, levels are high and low, her regular MD has decreased her insulin dosages to 10 units twice per day, pt states she vomited up her breakfast this am, her appetite is decreased and is hard for family to encourage her to eat, encouraged to call her regular MD on Monday even though she has an appointment on Wednesday, pt will return tomorrow am for another granix injection.

## 2015-05-28 ENCOUNTER — Inpatient Hospital Stay: Payer: Medicare Other

## 2015-05-28 VITALS — BP 90/62 | HR 89 | Temp 97.9°F | Resp 20

## 2015-05-28 MED ORDER — TBO-FILGRASTIM 480 MCG/0.8ML ~~LOC~~ SOSY
480.0000 ug | PREFILLED_SYRINGE | Freq: Once | SUBCUTANEOUS | Status: AC
  Administered 2015-05-28: 480 ug via SUBCUTANEOUS

## 2015-05-28 NOTE — Progress Notes (Unsigned)
Pt feels better this am no vomiting, blood sugar in the 200's, discussed sliding scale with pt and her husband and encouraged them to talk to their regular MD about it, gave pt her shot in their vehicle so she did not have to get into wheelchair and come upstairs to infusion

## 2015-05-29 ENCOUNTER — Inpatient Hospital Stay: Payer: Medicare Other

## 2015-05-29 VITALS — BP 98/61 | HR 94 | Temp 97.5°F | Resp 20

## 2015-05-29 DIAGNOSIS — C50911 Malignant neoplasm of unspecified site of right female breast: Secondary | ICD-10-CM | POA: Diagnosis not present

## 2015-05-29 DIAGNOSIS — D702 Other drug-induced agranulocytosis: Secondary | ICD-10-CM

## 2015-05-29 DIAGNOSIS — C50421 Malignant neoplasm of upper-outer quadrant of right male breast: Secondary | ICD-10-CM

## 2015-05-29 MED ORDER — TBO-FILGRASTIM 480 MCG/0.8ML ~~LOC~~ SOSY
480.0000 ug | PREFILLED_SYRINGE | Freq: Once | SUBCUTANEOUS | Status: AC
  Administered 2015-05-29: 480 ug via SUBCUTANEOUS
  Filled 2015-05-29: qty 0.8

## 2015-05-30 ENCOUNTER — Ambulatory Visit
Admission: RE | Admit: 2015-05-30 | Discharge: 2015-05-30 | Disposition: A | Discharge status: Home / Self Care | Payer: Medicare Other | Source: Ambulatory Visit | Attending: Internal Medicine | Admitting: Internal Medicine

## 2015-05-30 ENCOUNTER — Ambulatory Visit: Payer: Medicare Other

## 2015-05-30 ENCOUNTER — Other Ambulatory Visit: Payer: Medicare Other

## 2015-05-30 ENCOUNTER — Inpatient Hospital Stay: Payer: Medicare Other

## 2015-05-30 ENCOUNTER — Ambulatory Visit: Payer: Medicare Other | Admitting: Family Medicine

## 2015-05-30 VITALS — BP 89/55 | HR 93 | Temp 98.2°F | Resp 18

## 2015-05-30 DIAGNOSIS — Z79899 Other long term (current) drug therapy: Secondary | ICD-10-CM | POA: Diagnosis not present

## 2015-05-30 DIAGNOSIS — C787 Secondary malignant neoplasm of liver and intrahepatic bile duct: Secondary | ICD-10-CM | POA: Diagnosis not present

## 2015-05-30 DIAGNOSIS — C50811 Malignant neoplasm of overlapping sites of right female breast: Secondary | ICD-10-CM | POA: Insufficient documentation

## 2015-05-30 DIAGNOSIS — C50911 Malignant neoplasm of unspecified site of right female breast: Secondary | ICD-10-CM | POA: Diagnosis not present

## 2015-05-30 DIAGNOSIS — C50421 Malignant neoplasm of upper-outer quadrant of right male breast: Secondary | ICD-10-CM

## 2015-05-30 DIAGNOSIS — R918 Other nonspecific abnormal finding of lung field: Secondary | ICD-10-CM | POA: Diagnosis not present

## 2015-05-30 DIAGNOSIS — D702 Other drug-induced agranulocytosis: Secondary | ICD-10-CM

## 2015-05-30 LAB — GLUCOSE, CAPILLARY: Glucose-Capillary: 117 mg/dL — ABNORMAL HIGH (ref 65–99)

## 2015-05-30 MED ORDER — TBO-FILGRASTIM 480 MCG/0.8ML ~~LOC~~ SOSY
480.0000 ug | PREFILLED_SYRINGE | Freq: Once | SUBCUTANEOUS | Status: AC
  Administered 2015-05-30: 480 ug via SUBCUTANEOUS
  Filled 2015-05-30: qty 0.8

## 2015-05-30 MED ORDER — FLUDEOXYGLUCOSE F - 18 (FDG) INJECTION
13.0200 | Freq: Once | INTRAVENOUS | Status: DC | PRN
  Administered 2015-05-30: 13.02 via INTRAVENOUS
  Filled 2015-05-30: qty 13.02

## 2015-05-31 ENCOUNTER — Telehealth: Payer: Self-pay | Admitting: *Deleted

## 2015-05-31 LAB — FUNGUS CULTURE W SMEAR

## 2015-05-31 MED ORDER — DIPHENOXYLATE-ATROPINE 2.5-0.025 MG PO TABS: ORAL_TABLET | ORAL | Status: AC

## 2015-05-31 NOTE — Telephone Encounter (Signed)
Per Dr B, lomotil ordered 2 tabs after first loose stool then 1 tab up to 8 tabs a day after each loose bm. I called adn spoke with BL and he repeated this back to me twice. Med called in to pharmacy [facility]

## 2015-05-31 NOTE — Telephone Encounter (Signed)
Having 4-5 watery stools per day for past 3 days.  Asking for med for it and states that in past Imodium AD has not worked.

## 2015-06-01 ENCOUNTER — Other Ambulatory Visit: Payer: Medicare Other

## 2015-06-01 ENCOUNTER — Inpatient Hospital Stay (HOSPITAL_BASED_OUTPATIENT_CLINIC_OR_DEPARTMENT_OTHER): Payer: Medicare Other | Admitting: Internal Medicine

## 2015-06-01 ENCOUNTER — Other Ambulatory Visit: Payer: Self-pay | Admitting: *Deleted

## 2015-06-01 ENCOUNTER — Ambulatory Visit: Payer: Medicare Other | Admitting: Internal Medicine

## 2015-06-01 ENCOUNTER — Encounter: Payer: Self-pay | Admitting: Internal Medicine

## 2015-06-01 ENCOUNTER — Ambulatory Visit: Payer: Medicare Other

## 2015-06-01 ENCOUNTER — Inpatient Hospital Stay: Payer: Medicare Other

## 2015-06-01 VITALS — BP 100/62 | HR 90 | Temp 97.2°F | Resp 22

## 2015-06-01 DIAGNOSIS — C7802 Secondary malignant neoplasm of left lung: Secondary | ICD-10-CM | POA: Diagnosis not present

## 2015-06-01 DIAGNOSIS — Z9011 Acquired absence of right breast and nipple: Secondary | ICD-10-CM

## 2015-06-01 DIAGNOSIS — D696 Thrombocytopenia, unspecified: Secondary | ICD-10-CM

## 2015-06-01 DIAGNOSIS — Z794 Long term (current) use of insulin: Secondary | ICD-10-CM

## 2015-06-01 DIAGNOSIS — R109 Unspecified abdominal pain: Secondary | ICD-10-CM

## 2015-06-01 DIAGNOSIS — C50911 Malignant neoplasm of unspecified site of right female breast: Secondary | ICD-10-CM | POA: Diagnosis not present

## 2015-06-01 DIAGNOSIS — C787 Secondary malignant neoplasm of liver and intrahepatic bile duct: Secondary | ICD-10-CM

## 2015-06-01 DIAGNOSIS — C50811 Malignant neoplasm of overlapping sites of right female breast: Secondary | ICD-10-CM

## 2015-06-01 DIAGNOSIS — D72819 Decreased white blood cell count, unspecified: Secondary | ICD-10-CM

## 2015-06-01 DIAGNOSIS — E119 Type 2 diabetes mellitus without complications: Secondary | ICD-10-CM

## 2015-06-01 DIAGNOSIS — C7800 Secondary malignant neoplasm of unspecified lung: Secondary | ICD-10-CM

## 2015-06-01 DIAGNOSIS — I509 Heart failure, unspecified: Secondary | ICD-10-CM

## 2015-06-01 DIAGNOSIS — R531 Weakness: Secondary | ICD-10-CM

## 2015-06-01 DIAGNOSIS — M8580 Other specified disorders of bone density and structure, unspecified site: Secondary | ICD-10-CM

## 2015-06-01 DIAGNOSIS — Z79899 Other long term (current) drug therapy: Secondary | ICD-10-CM

## 2015-06-01 DIAGNOSIS — Z170 Estrogen receptor positive status [ER+]: Secondary | ICD-10-CM

## 2015-06-01 DIAGNOSIS — I2510 Atherosclerotic heart disease of native coronary artery without angina pectoris: Secondary | ICD-10-CM

## 2015-06-01 DIAGNOSIS — Z923 Personal history of irradiation: Secondary | ICD-10-CM

## 2015-06-01 DIAGNOSIS — R05 Cough: Secondary | ICD-10-CM

## 2015-06-01 DIAGNOSIS — R197 Diarrhea, unspecified: Secondary | ICD-10-CM

## 2015-06-01 DIAGNOSIS — Z9221 Personal history of antineoplastic chemotherapy: Secondary | ICD-10-CM

## 2015-06-01 DIAGNOSIS — D649 Anemia, unspecified: Secondary | ICD-10-CM

## 2015-06-01 DIAGNOSIS — M129 Arthropathy, unspecified: Secondary | ICD-10-CM

## 2015-06-01 DIAGNOSIS — R7989 Other specified abnormal findings of blood chemistry: Secondary | ICD-10-CM

## 2015-06-01 DIAGNOSIS — G893 Neoplasm related pain (acute) (chronic): Secondary | ICD-10-CM

## 2015-06-01 DIAGNOSIS — R1011 Right upper quadrant pain: Secondary | ICD-10-CM

## 2015-06-01 DIAGNOSIS — K259 Gastric ulcer, unspecified as acute or chronic, without hemorrhage or perforation: Secondary | ICD-10-CM

## 2015-06-01 DIAGNOSIS — R5383 Other fatigue: Secondary | ICD-10-CM

## 2015-06-01 DIAGNOSIS — Z95810 Presence of automatic (implantable) cardiac defibrillator: Secondary | ICD-10-CM

## 2015-06-01 DIAGNOSIS — R630 Anorexia: Secondary | ICD-10-CM

## 2015-06-01 DIAGNOSIS — N289 Disorder of kidney and ureter, unspecified: Secondary | ICD-10-CM

## 2015-06-01 DIAGNOSIS — R112 Nausea with vomiting, unspecified: Secondary | ICD-10-CM

## 2015-06-01 DIAGNOSIS — R0602 Shortness of breath: Secondary | ICD-10-CM

## 2015-06-01 DIAGNOSIS — I10 Essential (primary) hypertension: Secondary | ICD-10-CM

## 2015-06-01 LAB — COMPREHENSIVE METABOLIC PANEL
ALT: 128 U/L — AB (ref 14–54)
AST: 193 U/L — AB (ref 15–41)
Albumin: 1.8 g/dL — ABNORMAL LOW (ref 3.5–5.0)
Alkaline Phosphatase: 501 U/L — ABNORMAL HIGH (ref 38–126)
Anion gap: 13 (ref 5–15)
BUN: 88 mg/dL — AB (ref 6–20)
CHLORIDE: 104 mmol/L (ref 101–111)
CO2: 20 mmol/L — ABNORMAL LOW (ref 22–32)
CREATININE: 3.23 mg/dL — AB (ref 0.44–1.00)
Calcium: 7.5 mg/dL — ABNORMAL LOW (ref 8.9–10.3)
GFR calc Af Amer: 15 mL/min — ABNORMAL LOW (ref >60)
GFR calc non Af Amer: 13 mL/min — ABNORMAL LOW (ref >60)
GLUCOSE: 261 mg/dL — AB (ref 65–99)
Potassium: 4.4 mmol/L (ref 3.5–5.1)
SODIUM: 137 mmol/L (ref 135–145)
Total Bilirubin: 3.3 mg/dL — ABNORMAL HIGH (ref 0.3–1.2)
Total Protein: 7.4 g/dL (ref 6.5–8.1)

## 2015-06-01 LAB — CBC WITH DIFFERENTIAL/PLATELET
BASOS ABS: 0 10*3/uL (ref 0–0.1)
Basophils Relative: 0 %
Eosinophils Absolute: 0 10*3/uL (ref 0–0.7)
Eosinophils Relative: 0 %
HCT: 28.7 % — ABNORMAL LOW (ref 35.0–47.0)
HEMOGLOBIN: 9.7 g/dL — AB (ref 12.0–16.0)
LYMPHS ABS: 0.7 10*3/uL — AB (ref 1.0–3.6)
Lymphocytes Relative: 58 %
MCH: 32.1 pg (ref 26.0–34.0)
MCHC: 33.8 g/dL (ref 32.0–36.0)
MCV: 94.8 fL (ref 80.0–100.0)
MONO ABS: 0.3 10*3/uL (ref 0.2–0.9)
MONOS PCT: 25 %
NEUTROS PCT: 17 %
Neutro Abs: 0.2 10*3/uL — ABNORMAL LOW (ref 1.4–6.5)
PLATELETS: 106 10*3/uL — AB (ref 150–440)
RBC: 3.02 MIL/uL — AB (ref 3.80–5.20)
RDW: 25.8 % — ABNORMAL HIGH (ref 11.5–14.5)
WBC: 1.2 10*3/uL — AB (ref 3.6–11.0)

## 2015-06-01 MED ORDER — ONDANSETRON HCL 8 MG PO TABS: 8.0000 mg | ORAL_TABLET | Freq: Three times a day (TID) | ORAL | Status: AC | PRN

## 2015-06-01 MED ORDER — FENTANYL 12 MCG/HR TD PT72: 12.0000 ug | MEDICATED_PATCH | TRANSDERMAL | Status: AC

## 2015-06-01 MED ORDER — MORPHINE SULFATE 20 MG/5ML PO SOLN: 2.5000 mg | ORAL | Status: AC | PRN

## 2015-06-01 NOTE — Progress Notes (Signed)
[place] OFFICE PROGRESS NOTE  Patient Care Team: Dion Body, MD as PCP - General (Family Medicine)   SUMMARY OF ONCOLOGIC HISTORY:  # AUG 2009-  BREAST CA STAGE III ER/PR POS; Her 2 NEG s/p Neo-adj ddAC-Tq Wx 8 [sec to PN]; s/p RT; Femara  # AUG 2016- METASTATIC BREAST CA [s/p Liver Bx] ER/PR>90%; Her 2 neu- NEG; MEDIA/[place]/? Lymphangiatic;  START Darrick Meigs; OCT 13th 2016- START ERIBULIN   # ? METS to bone- L ant 8th & R pos 11th  # Renal failure [Creat ~1.5- 2.0]    INTERVAL HISTORY:  A very pleasant 81 year old female patient with above history of metastatic breast cancer to the liver/lung-lymphangitic spread ER/PR positive HER-2/neu negative currently on second line therapy with Eribulin cycle #1 day 1 approximately week ago is here to proceed with cycle #1 day 8 chemotherapy/also review the results of her restaging PET scan.  However in the interim as per the family she has had a rapid decline in her physical condition- she has had severe diarrhea; complaining of worsening pain in the abdomen; and also generalized weakness. She does have ongoing cough.   Patient's appetite is poor. Nausea with vomiting postchemotherapy.  REVIEW OF SYSTEMS:  A complete 10 point review of system is done which is negative except mentioned above/history of present illness.   PAST MEDICAL HISTORY :  Past Medical History  Diagnosis Date  . Diabetes mellitus without complication ([place])   . Hypertension   . Coronary artery disease   . Cancer ([place])   . CHF (congestive heart failure) ([place])   . AICD (automatic cardioverter/defibrillator) present   . Shortness of breath dyspnea   . Breast cancer ([place]) right mastectomy    PAST SURGICAL HISTORY :   Past Surgical History  Procedure Laterality Date  . Cardiac defibrillator placement Left   . Mastectomy Right   . Joint replacement Right     knee  . Esophagogastroduodenoscopy (egd) with propofol N/A 05/10/2015   Procedure: ESOPHAGOGASTRODUODENOSCOPY (EGD) WITH PROPOFOL;  Surgeon: Lucilla Lame, MD;  Location: [facility] ENDOSCOPY;  Service: Endoscopy;  Laterality: N/A;    FAMILY HISTORY :   Family History  Problem Relation Age of Onset  . Alzheimer's disease Mother   . Congestive Heart Failure Father     SOCIAL HISTORY:   Social History  Substance Use Topics  . Smoking status: Never Smoker   . Smokeless tobacco: Never Used  . Alcohol Use: No    ALLERGIES:  is allergic to metformin; codeine; and eliquis.  MEDICATIONS:  Current Outpatient Prescriptions  Medication Sig Dispense Refill  . albuterol (PROVENTIL HFA;VENTOLIN HFA) 108 (90 BASE) MCG/ACT inhaler Inhale 2 puffs into the lungs every 6 (six) hours as needed for wheezing or shortness of breath. 1 Inhaler 0  . atorvastatin (LIPITOR) 40 MG tablet Take 40 mg by mouth daily.    . diphenoxylate-atropine (LOMOTIL) 2.5-0.025 MG tablet 1 tablet after each loose bowel movement up to 8 tablets a day 45 tablet 0  . fluconazole (DIFLUCAN) 100 MG tablet Take 100 mg by mouth daily.    Marland Kitchen guaiFENesin-dextromethorphan (ROBITUSSIN DM) 100-10 MG/5ML syrup Take 5 mLs by mouth every 4 (four) hours as needed for cough. 118 mL 0  . Insulin Lispro Prot & Lispro (HUMALOG 75/25 MIX) (75-25) 100 UNIT/ML Kwikpen Inject 10 Units into the skin 2 (two) times daily. (Patient taking differently: Inject 10 Units into the skin as needed. ) 15 mL 0  . metoprolol succinate (TOPROL-XL)  25 MG 24 hr tablet Take 12.5 mg by mouth daily.    . Omega-3 Fatty Acids (FISH OIL) 1000 MG CAPS Take 1 capsule by mouth daily.    . sucralfate (CARAFATE) 1 GM/10ML suspension Take 10 mLs (1 g total) by mouth 4 (four) times daily -  with meals and at bedtime. 420 mL 3  . vitamin B-12 (CYANOCOBALAMIN) 1000 MCG tablet Take 1,000 mcg by mouth daily.    . Vitamin D, Ergocalciferol, (DRISDOL) 50000 UNITS CAPS capsule Take 50,000 Units by mouth every 7 (seven) days. Pt takes on Monday.    . fentaNYL  (DURAGESIC - DOSED MCG/HR) 12 MCG/HR Place 1 patch (12.5 mcg total) onto the skin every 3 (three) days. 10 patch 0  . living well with diabetes book MISC 1 each by Does not apply route once. 1 each 0  . morphine 20 MG/5ML solution Take 0.6 mLs (2.4 mg total) by mouth every 3 (three) hours as needed for pain. 100 mL 0  . ondansetron (ZOFRAN) 8 MG tablet Take 1 tablet (8 mg total) by mouth every 8 (eight) hours as needed for nausea or vomiting. 30 tablet 0  . palbociclib (IBRANCE) 125 MG capsule Take 125 mg by mouth daily with breakfast. Take daily for three weeks and stop for one week. Take whole with food.     No current facility-administered medications for this visit.   Facility-Administered Medications Ordered in Other Visits  Medication Dose Route Frequency Provider Last Rate Last Dose  . fludeoxyglucose F - 18 (FDG) injection 13.02 milli Curie  13.02 milli Curie Intravenous Once PRN Medication Radiologist, MD   13.02 milli Curie at 05/30/15 1058    PHYSICAL EXAMINATION: ECOG PERFORMANCE STATUS: 3 - Symptomatic, >50% confined to bed  BP 100/62 mmHg  Pulse 90  Temp(Src) 97.2 F (36.2 C) (Tympanic)  Resp 22  There were no vitals filed for this visit.  GENERAL:She is accompanied by her husband. Patient is in a wheelchair. She is in mild distress because of pain. Detailed exam not done given the nature the visit.  LABORATORY DATA:  I have reviewed the data as listed    Component Value Date/Time   NA 137 06/01/2015 1400   NA 138 08/27/2014 0500   K 4.4 06/01/2015 1400   K 4.1 08/27/2014 0500   CL 104 06/01/2015 1400   CL 105 08/27/2014 0500   CO2 20* 06/01/2015 1400   CO2 25 08/27/2014 0500   GLUCOSE 261* 06/01/2015 1400   GLUCOSE 63* 08/27/2014 0500   BUN 88* 06/01/2015 1400   BUN 23* 08/27/2014 0500   CREATININE 3.23* 06/01/2015 1400   CREATININE 1.21 08/27/2014 0500   CALCIUM 7.5* 06/01/2015 1400   CALCIUM 9.0 08/27/2014 0500   PROT 7.4 06/01/2015 1400   PROT 8.1  08/24/2014 1252   ALBUMIN 1.8* 06/01/2015 1400   ALBUMIN 2.9* 08/24/2014 1252   AST 193* 06/01/2015 1400   AST 45* 08/24/2014 1252   ALT 128* 06/01/2015 1400   ALT 38 08/24/2014 1252   ALKPHOS 501* 06/01/2015 1400   ALKPHOS 226* 08/24/2014 1252   BILITOT 3.3* 06/01/2015 1400   BILITOT 0.4 08/24/2014 1252   GFRNONAA 13* 06/01/2015 1400   GFRNONAA 46* 08/27/2014 0500   GFRNONAA 50* 03/16/2014 0926   GFRAA 15* 06/01/2015 1400   GFRAA 55* 08/27/2014 0500   GFRAA 58* 03/16/2014 0926    No results found for: SPEP, UPEP  Lab Results  Component Value Date   WBC 1.2* 06/01/2015  NEUTROABS 0.2* 06/01/2015   HGB 9.7* 06/01/2015   HCT 28.7* 06/01/2015   MCV 94.8 06/01/2015   PLT 106* 06/01/2015      Chemistry      Component Value Date/Time   NA 137 06/01/2015 1400   NA 138 08/27/2014 0500   K 4.4 06/01/2015 1400   K 4.1 08/27/2014 0500   CL 104 06/01/2015 1400   CL 105 08/27/2014 0500   CO2 20* 06/01/2015 1400   CO2 25 08/27/2014 0500   BUN 88* 06/01/2015 1400   BUN 23* 08/27/2014 0500   CREATININE 3.23* 06/01/2015 1400   CREATININE 1.21 08/27/2014 0500      Component Value Date/Time   CALCIUM 7.5* 06/01/2015 1400   CALCIUM 9.0 08/27/2014 0500   ALKPHOS 501* 06/01/2015 1400   ALKPHOS 226* 08/24/2014 1252   AST 193* 06/01/2015 1400   AST 45* 08/24/2014 1252   ALT 128* 06/01/2015 1400   ALT 38 08/24/2014 1252   BILITOT 3.3* 06/01/2015 1400   BILITOT 0.4 08/24/2014 1252       RADIOGRAPHIC STUDIES: I have personally reviewed the radiological images as listed and agreed with the findings in the report. No results found.   ASSESSMENT & PLAN:  # METASTATIC BREAST CA- to the liver/lung? Lymphangitic spread- ER/PR positive HER-2/neu negative; currently on second line therapy with Eribulin cycle #1 day 8 today  # Clinically unfortunately patient has had a significant decline in her performance status/clinical condition. She has poor tolerance to chemotherapy; in the  context of worsening metastatic disease. I reviewed the PET scan images myself reviewed the images with the patient and daughter-in-law and son in detail. Hold chemotherapy.  # I would recommend hospice as I think this would keep her comfortable; rather than any ongoing therapies. After a discussion patient/husband/family agrees hospice. I suspect her life expectancy is in the order of a few weeks hence she qualifies for hospice. Hospice referral was made today..   # Poor pain control- prescription for morphine concentrated liquid; also a low-dose fentanyl patch was given.  All questions were answered.   I spent 45 minutes counseling the patient face to face. The total time spent in the appointment was 60 minutes and more than 50% was on counseling and review of test results     Cammie Sickle, MD 06/01/2015 3:14 PM

## 2015-06-01 NOTE — Progress Notes (Signed)
Lots of back pain. Pt scans to go over  With her and md already discussed with family that he rec. Hospice and md will discuss with pt right now

## 2015-06-05 ENCOUNTER — Telehealth: Payer: Self-pay

## 2015-06-05 ENCOUNTER — Telehealth: Payer: Self-pay | Admitting: *Deleted

## 2015-06-05 NOTE — Telephone Encounter (Signed)
-----   Message from Lucilla Lame, MD sent at 06/05/2015  1:26 PM EDT ----- Make sure patient was treated for her Candida esophagitis.

## 2015-06-05 NOTE — Telephone Encounter (Signed)
Pt passed away on 06/09/15.

## 2015-06-05 NOTE — Telephone Encounter (Signed)
Patient passed away 06-30-15 @ 13:03

## 2015-06-08 ENCOUNTER — Ambulatory Visit: Payer: Medicare Other | Admitting: Gastroenterology

## 2015-06-13 DEATH — deceased

## 2015-07-25 ENCOUNTER — Encounter: Payer: Self-pay | Admitting: Diagnostic Radiology

## 2015-08-10 ENCOUNTER — Other Ambulatory Visit: Payer: Self-pay | Admitting: Nurse Practitioner

## 2015-09-29 IMAGING — CT CT ANGIO CHEST
1 of 2 series · 18 of 30 positions shown · IV contrast (APPLIED)
Comparison: 03/15/2015.  03/04/2015.

CLINICAL DATA: Short of breath. Malignancy. Patient evaluated
03/04/2015 and diagnosed with bronchitis. Cough productive of
sputum.

EXAM:
CT ANGIOGRAPHY CHEST WITH CONTRAST
TECHNIQUE: Multidetector CT imaging of the chest was performed using the
standard protocol during bolus administration of intravenous
contrast. Multiplanar CT image reconstructions and MIPs were
obtained to evaluate the vascular anatomy.
CONTRAST:  75mL OMNIPAQUE IOHEXOL 350 MG/ML SOLN

[Series 5: pe 1.0 thins · axial · 0.70mm/px · z∈[-242,-30]mm · 18 of 241 slices shown]
[im 14/241  lung]
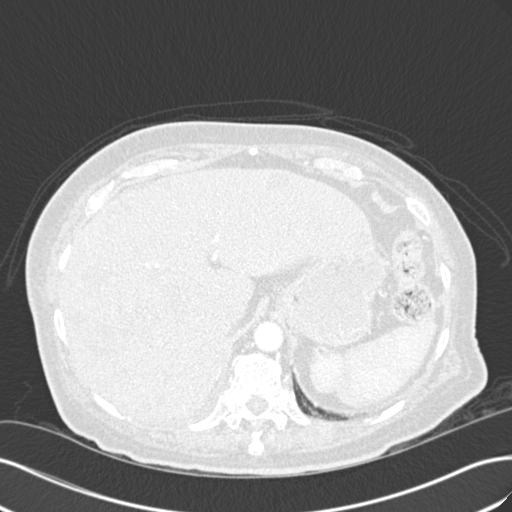
[im 27/241  mediastinal]
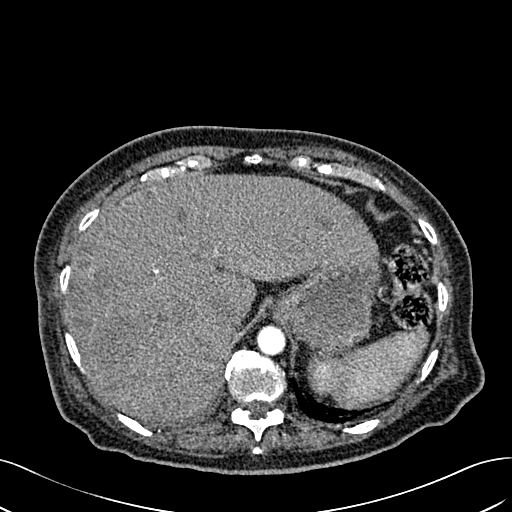
[im 41/241  lung]
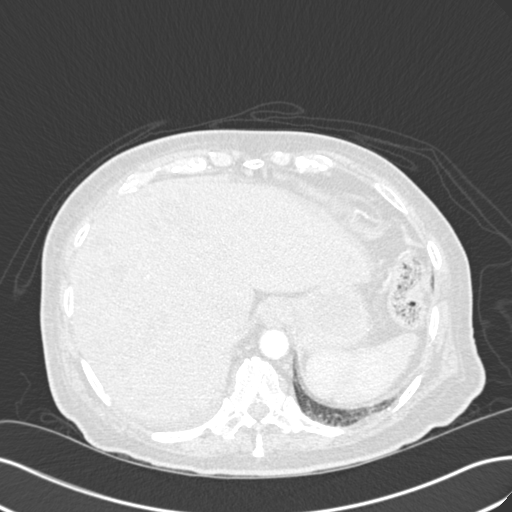
[im 54/241  mediastinal]
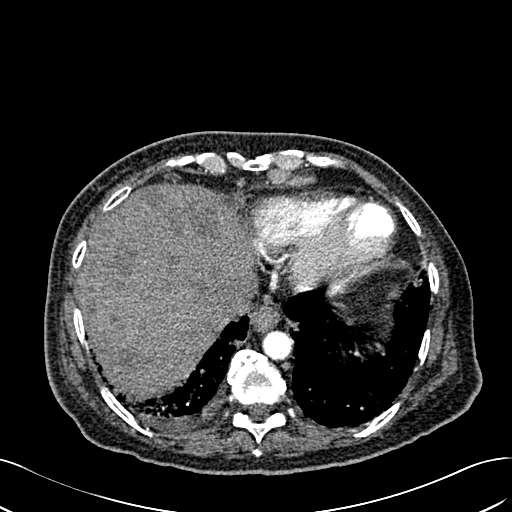
[im 67/241  lung]
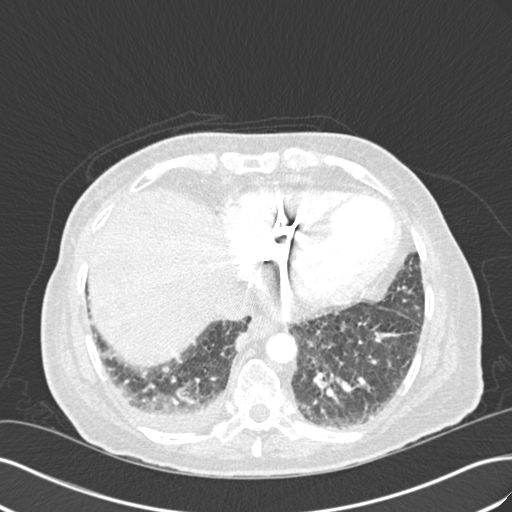
[im 81/241  mediastinal]
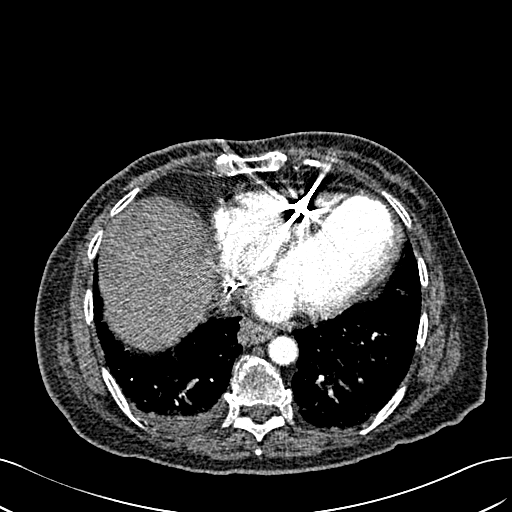
[im 94/241  lung]
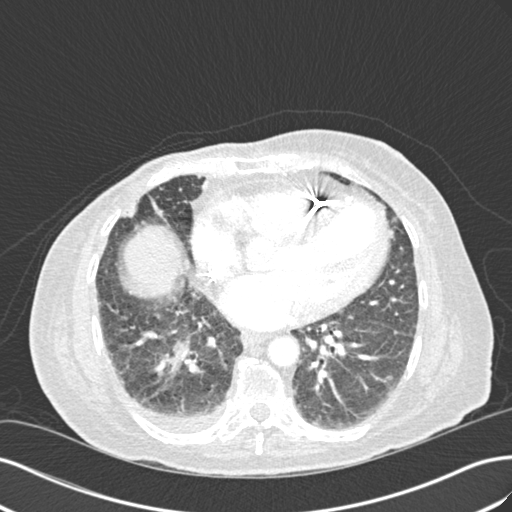
[im 107/241  mediastinal]
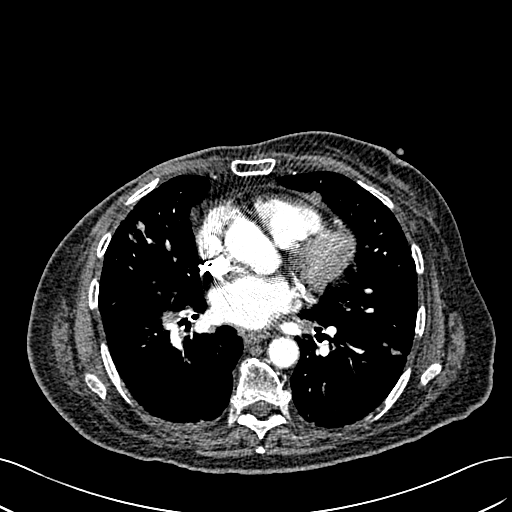
[im 111/241  lung]
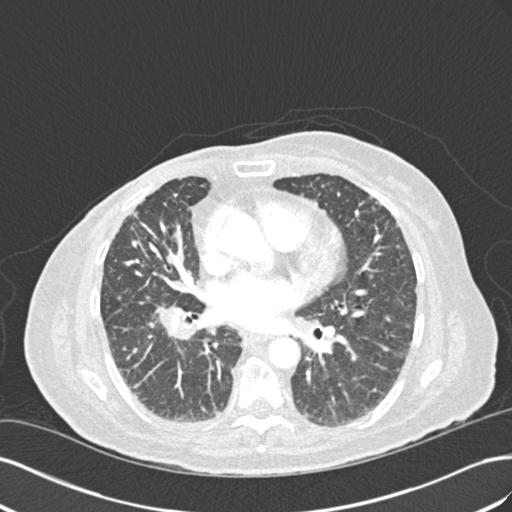
[im 121/241  mediastinal]
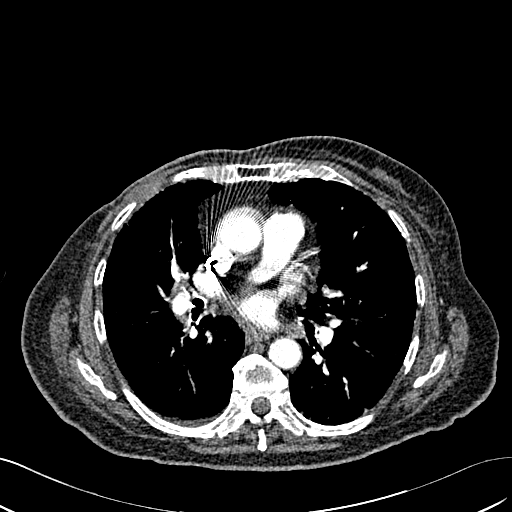
[im 134/241  lung]
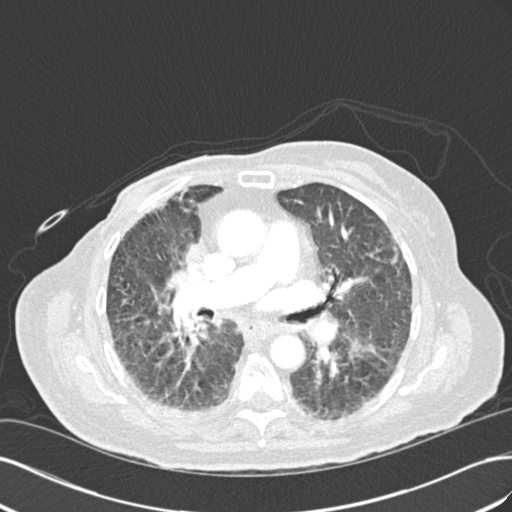
[im 147/241  mediastinal]
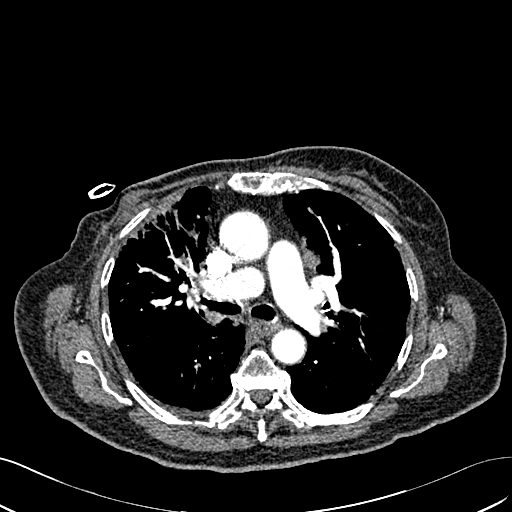
[im 161/241  lung]
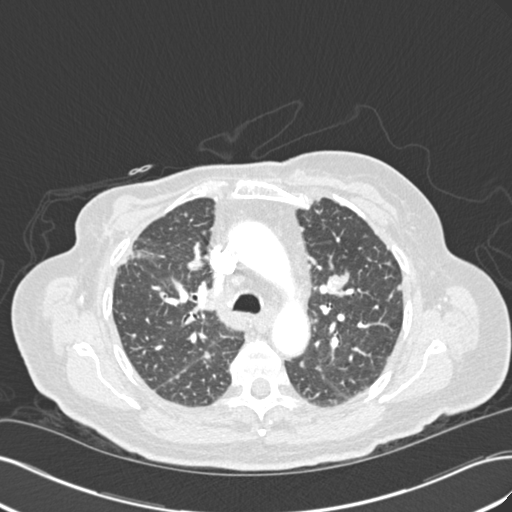
[im 174/241  mediastinal]
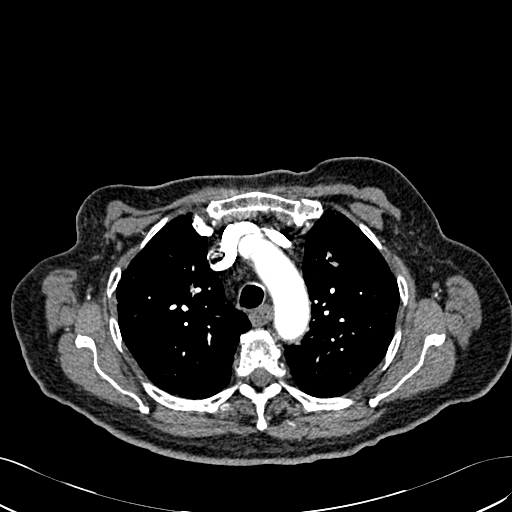
[im 187/241  lung]
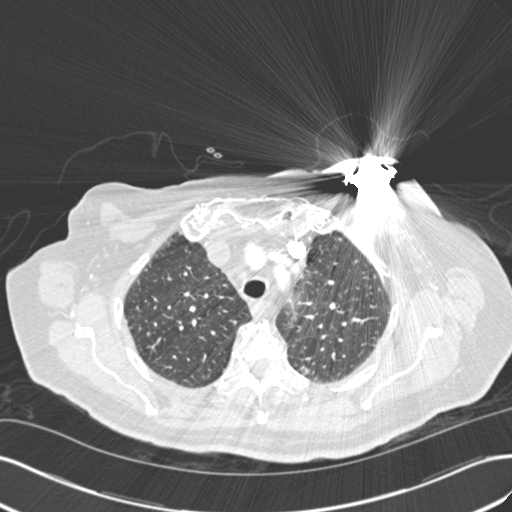
[im 201/241  mediastinal]
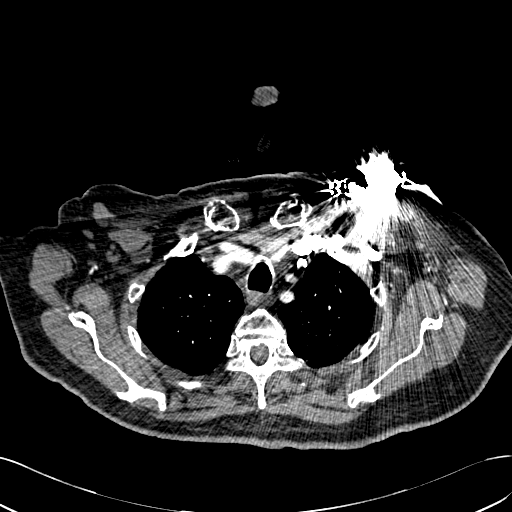
[im 214/241  lung]
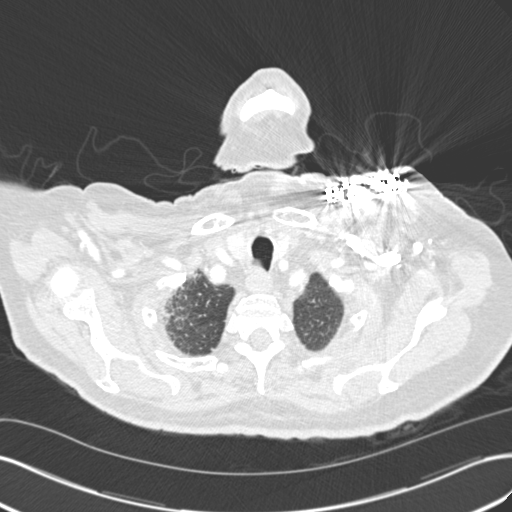
[im 227/241  mediastinal]
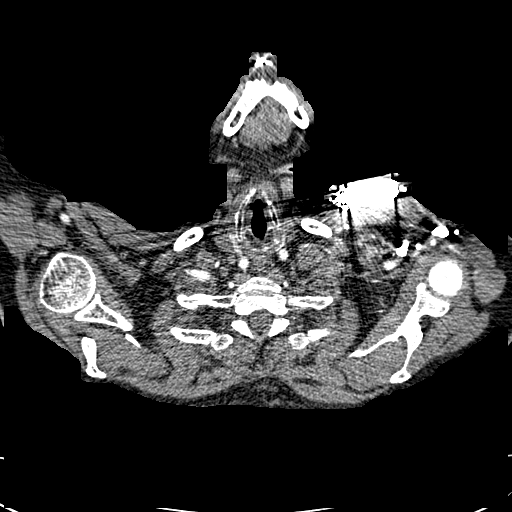

[18 of 30 positions shown; findings below may reference images not displayed]

FINDINGS: Bones: Thoracic spine DISH. Osteopenia. No compression fracture of
the thoracic spine.

Cardiovascular: Unchanged single lead LEFT subclavian cardiac
pacemaker. No acute aortic abnormality. Negative for pulmonary
embolism. Respiratory motion artifact mildly degrades the
evaluation.

Lungs: Again, respiratory motion artifact is present. Radiation
fibrosis is present in the RIGHT upper lobe along the subpleural
surface anteriorly. Partially calcified nodule is present in the
LEFT suprahilar lung. Multiple other smaller pulmonary nodules are
present, some of which have a peribronchovascular distribution.
Pulmonary nodules appears similar to prior.

Central airways: Patent.

Effusions: Small chronic dependently layering RIGHT pleural
effusion. No LEFT pleural effusion. Compressive/relaxation
atelectasis associated with the effusions.

There is studding of the pulmonary fissures bilaterally suggesting
and lymphangitic spread of tumor.

Lymphadenopathy: No axillary adenopathy. Mediastinal adenopathy is
present. 1 centimeter pretracheal node is present. Borderline and
enlarged prevascular and AP window nodes are present. RIGHT hilar
adenopathy is present extending to the RIGHT infrahilar region. The
hilar adenopathy appears similar to the prior exam.

No pericardial effusion is identified however there are nodules
along the anterior pericardium compatible with metastatic disease.
One of these is lateral to the RIGHT atrium and the other is
anterior to the pulmonic valve.

Esophagus: Small hiatal hernia.

Upper abdomen: Infiltrative low-density lesions are present in both
hepatic lobes, most compatible with metastatic disease. No change
from prior. No acute upper abdominal abnormality.

Other: RIGHT mastectomy.

Review of the MIP images confirms the above findings.
IMPRESSION: 1. Negative for pulmonary embolus or acute aortic abnormality.
2. Atherosclerosis and coronary artery disease.
3. RIGHT mastectomy with radiation fibrosis in the anterior RIGHT
upper lobe.
4. Mediastinal and hilar adenopathy with pleural nodularity, most
compatible with lymphangitic spread of tumor. Infiltration of the
liver compatible with metastatic disease.

## 2015-11-22 IMAGING — CR DG CHEST 2V
1 series · 2 of 2 positions shown · non-contrast
Comparison: Radiographs and chest CT 03/15/2015

CLINICAL DATA: Cough for 2 months. Mid chest and mid back pain for
3 weeks. Breast cancer with history of metastatic disease to lung
and liver.

EXAM:
CHEST  2 VIEW

[Series 1: dg chest 2 view · 0.14mm/px · 2 of 2 slices shown]
[im 1/2]
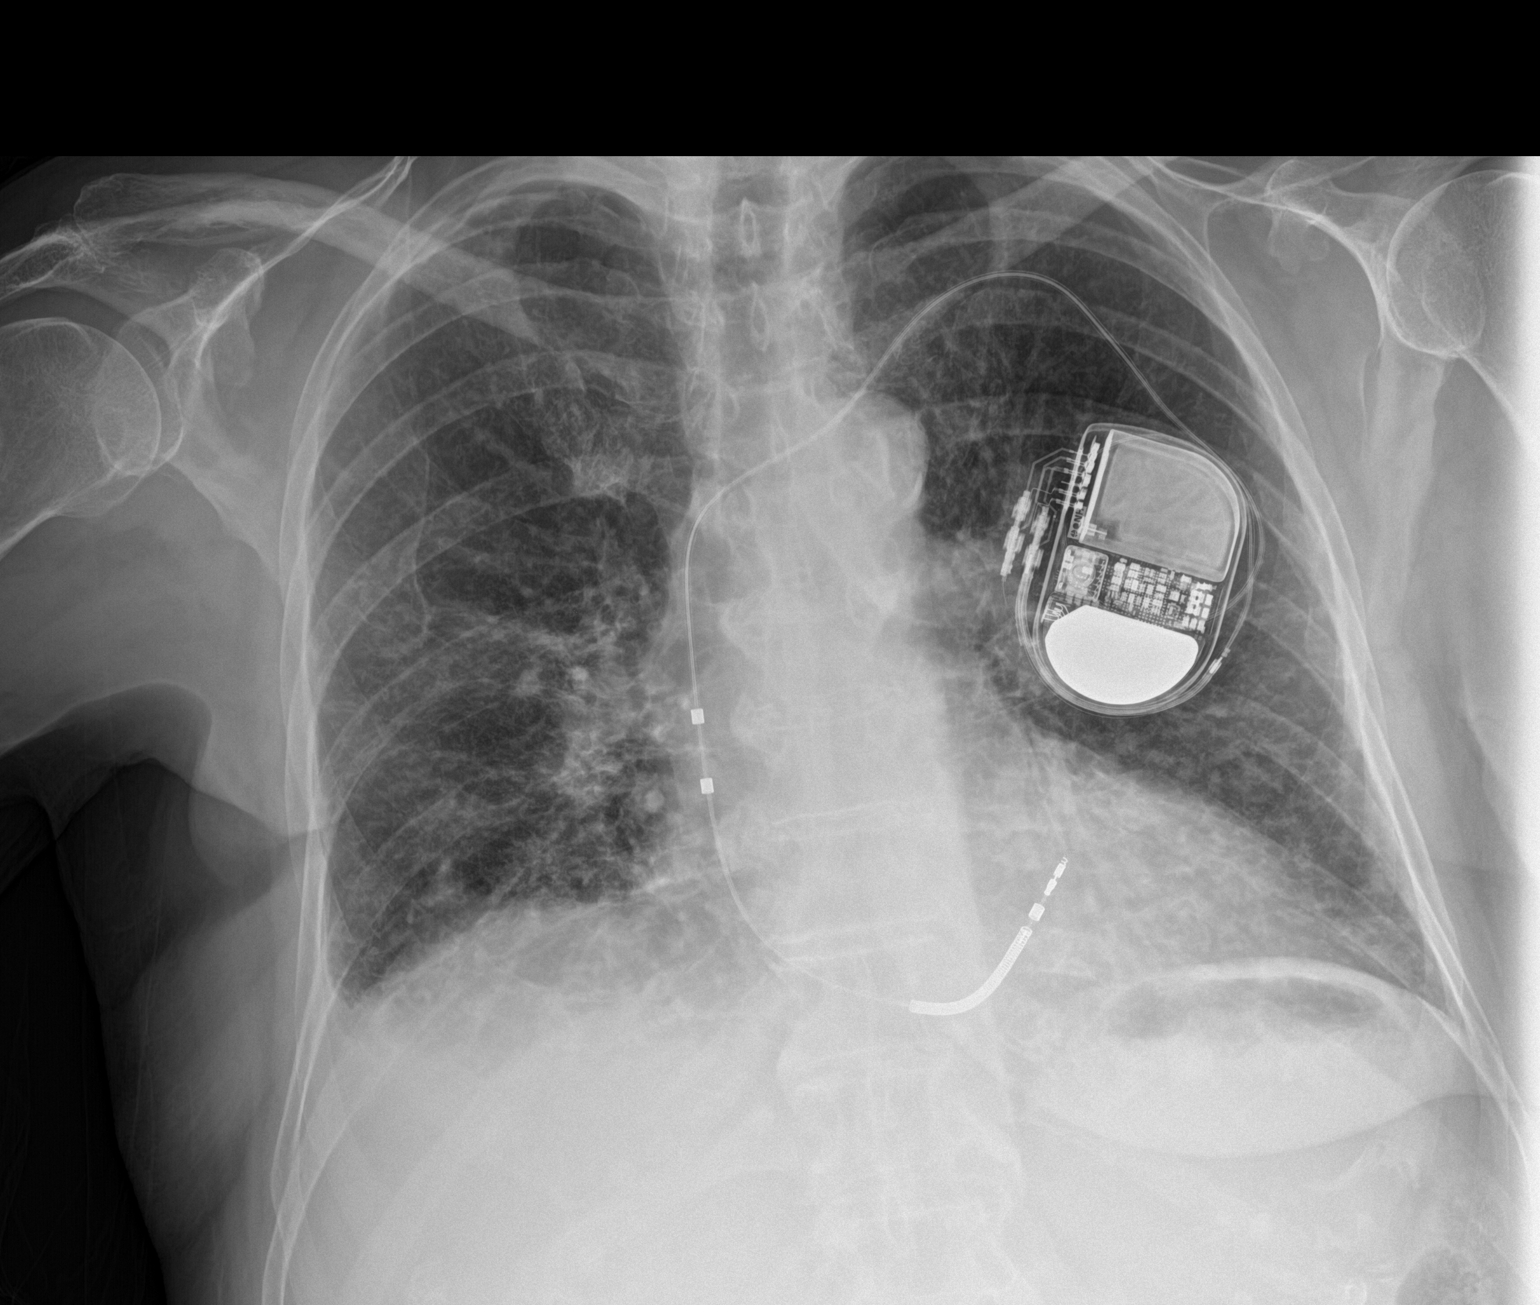
[im 2/2]
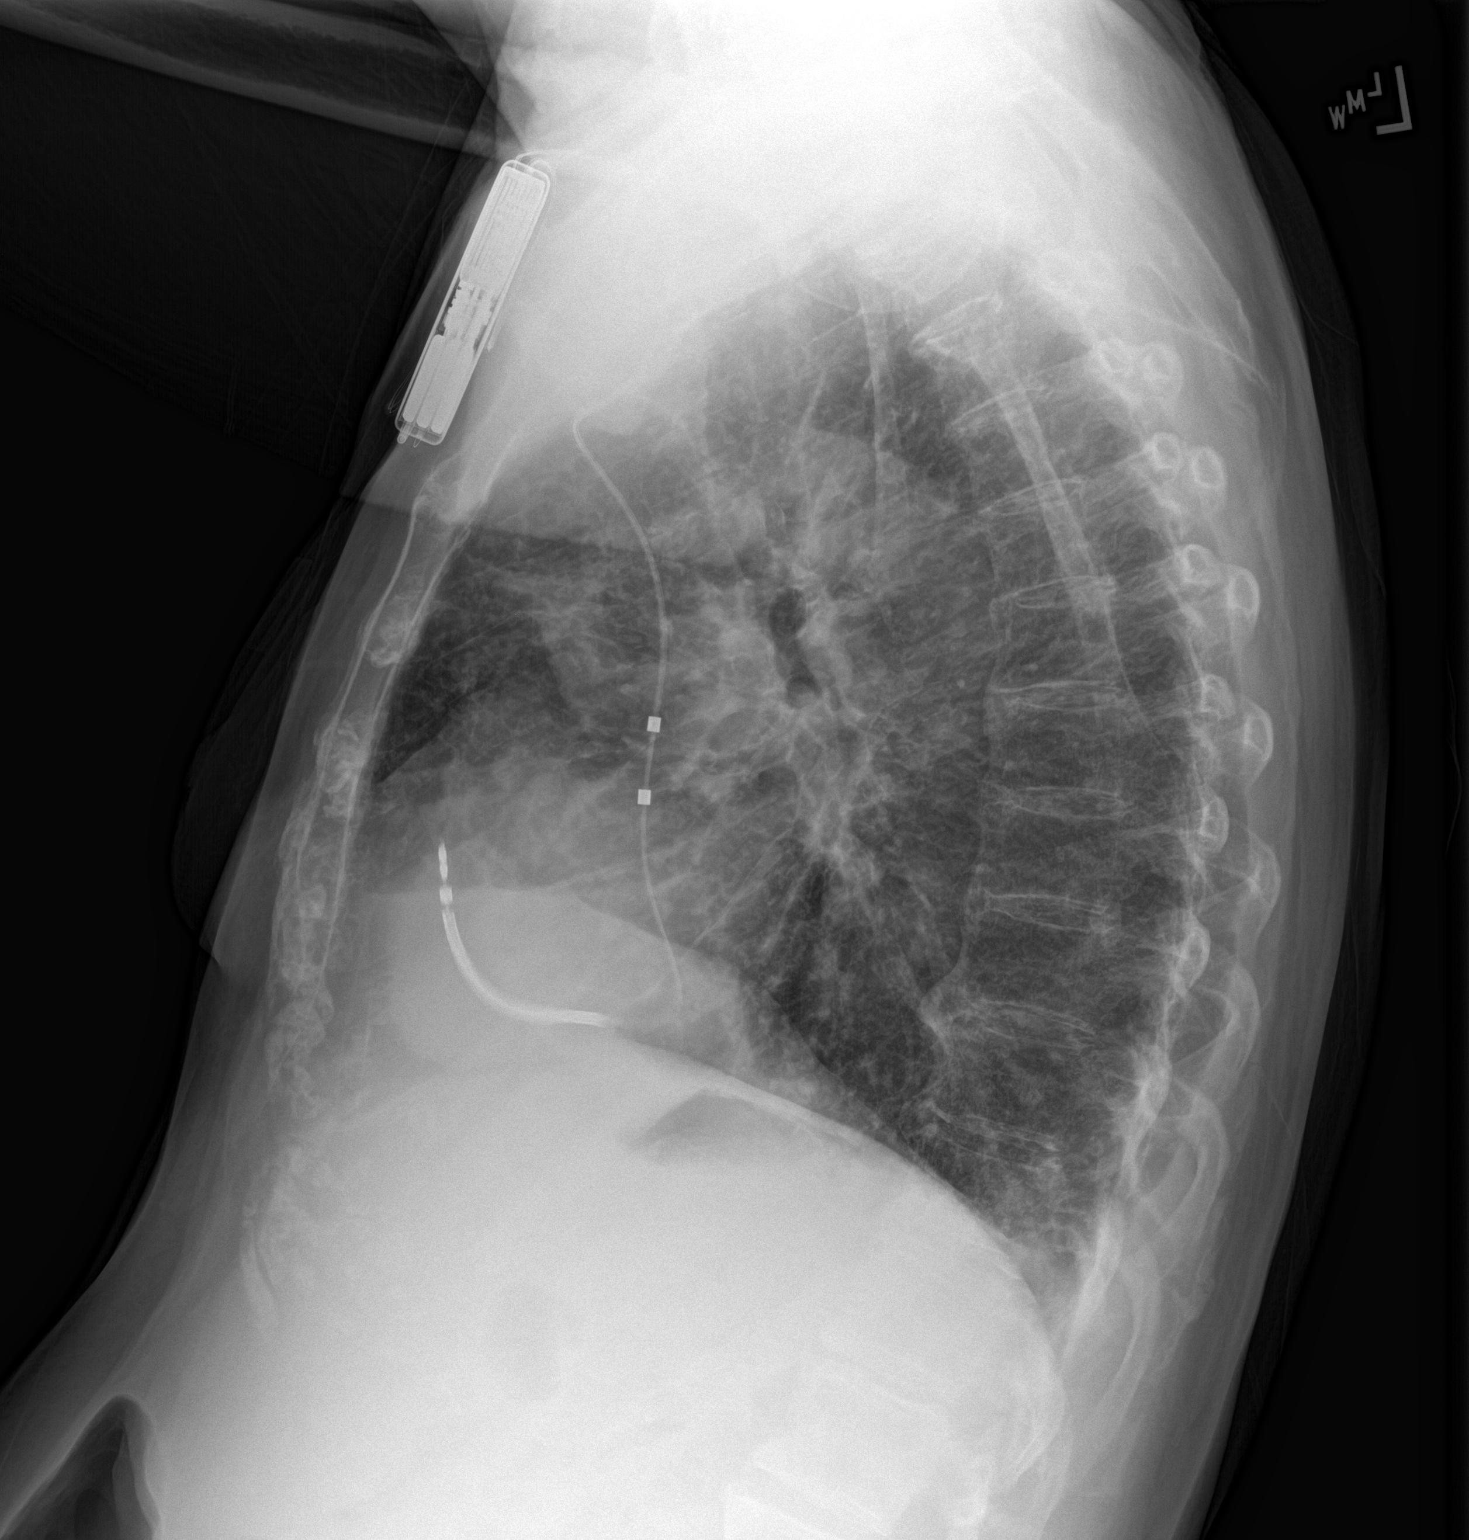

[2 of 2 positions shown; findings below may reference images not displayed]

FINDINGS: Single lead left-sided pacemaker remains in place. Cardiomediastinal
contours are unchanged, heart at the upper limits of normal in size.
Mild right hilar prominence again seen. The small pulmonary nodules
on recent CT are not well seen radiographically. Scarring in the
right midlung zone is again seen. No pulmonary edema, confluent
airspace disease, large pleural effusion or pneumothorax. The bones
are under mineralized with mild degenerative change in the spine.
IMPRESSION: Unchanged radiographic appearance of the chest.

## 2015-11-22 IMAGING — CR DG THORACIC SPINE 2V
3 series · 3 of 3 positions shown · non-contrast
Comparison: 03/15/2015.

CLINICAL DATA: Several days of mid and lumbago/low back pain.

EXAM:
THORACIC SPINE 2 VIEWS

[t-spine ap]
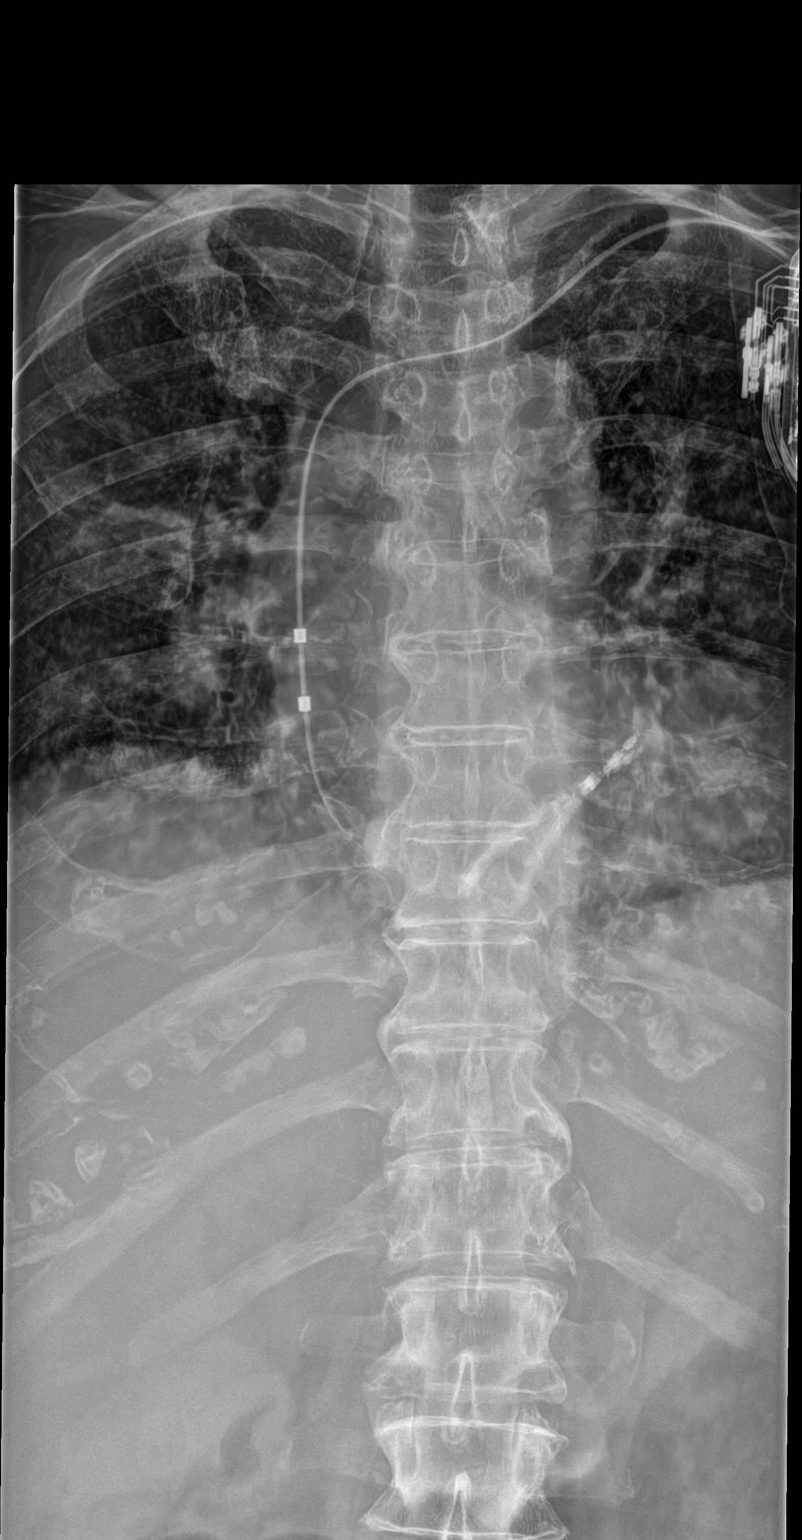

[t-spine lat]
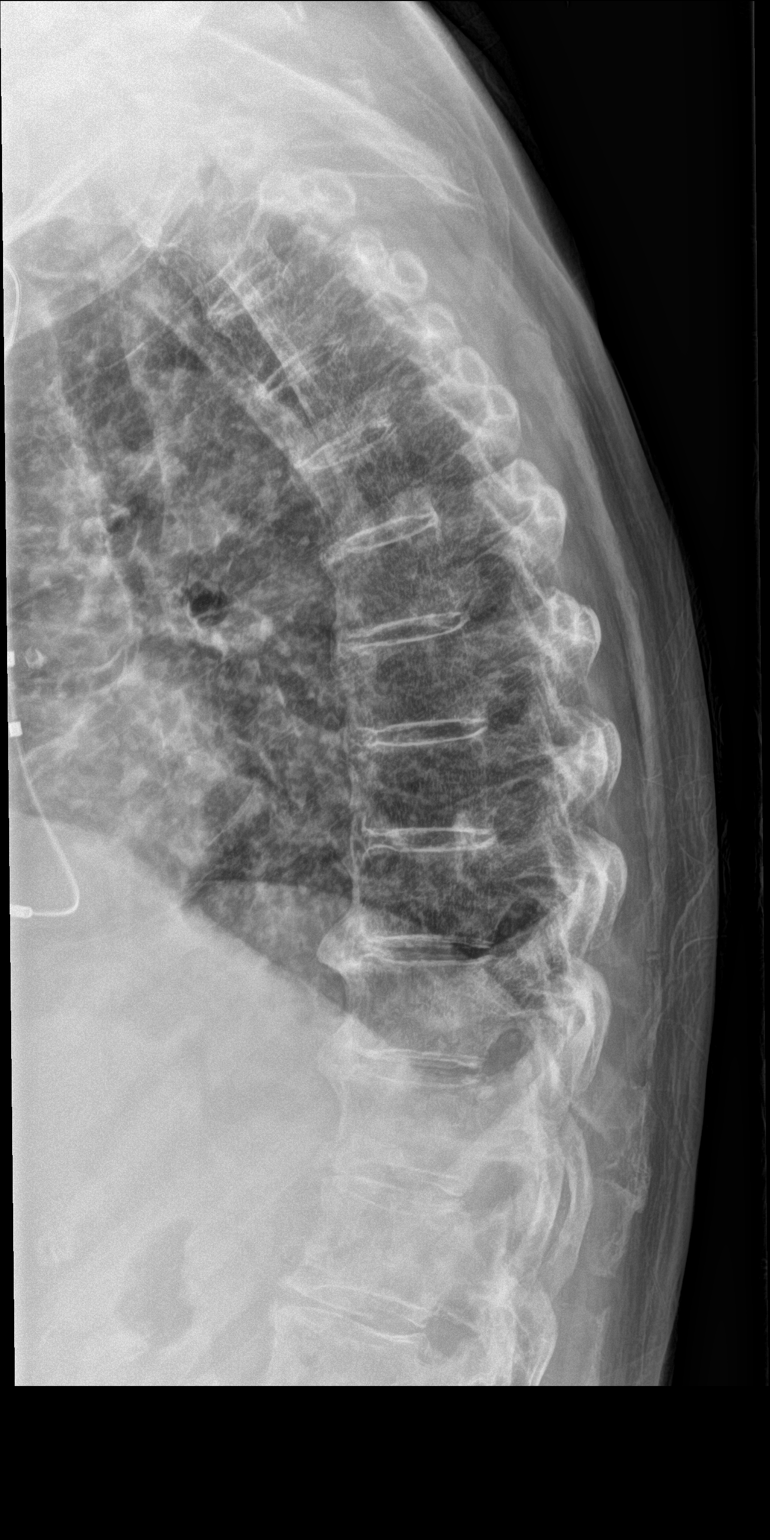

[t-spine swimmers]
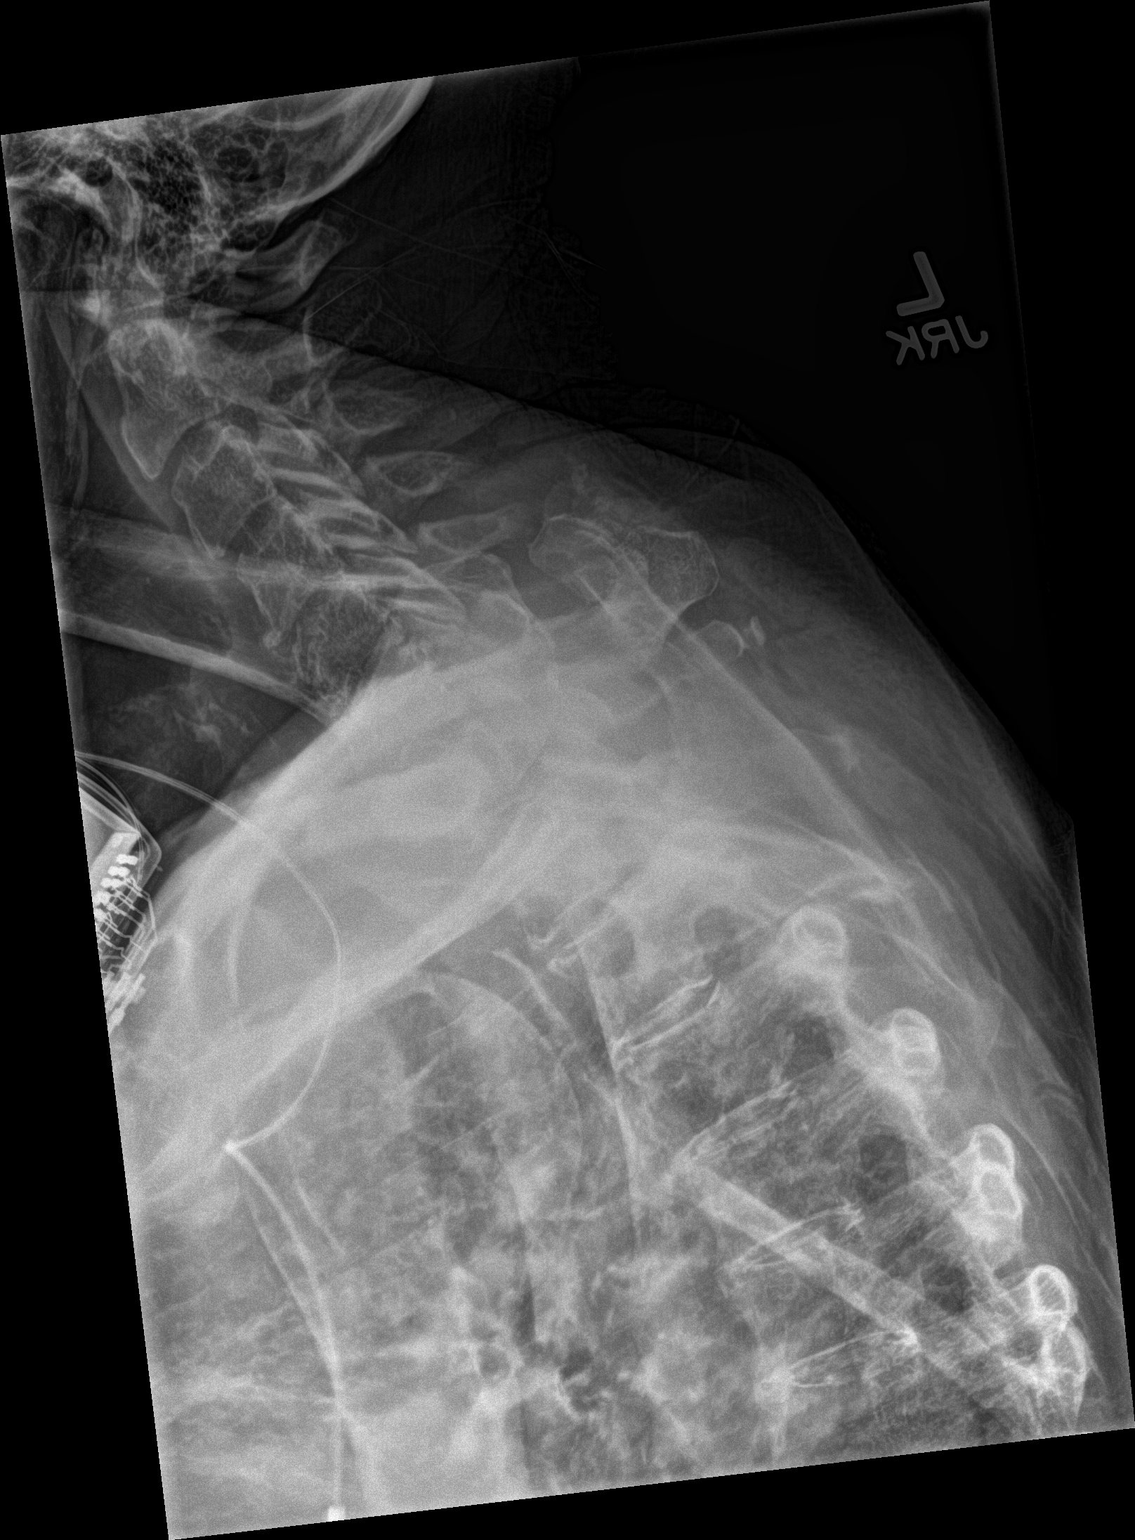

[3 of 3 positions shown; findings below may reference images not displayed]

FINDINGS: Thoracic spine DISH. No compression fractures are identified.
Swimmer's view is submitted for interpretation. The superior aspect
of the T1 vertebra is poorly visible because of bony overlap. No
gross fracture or malalignment. Paraspinal lines appear within
normal limits. LEFT subclavian AICD noted.
IMPRESSION: No acute abnormality or interval change.
